# Patient Record
Sex: Male | Born: 1960 | Race: White | Hispanic: No | Smoking: Former smoker
Health system: Southern US, Community
[De-identification: ages and names within clinical notes are randomized; demographics above are authoritative.]

## PROBLEM LIST (undated history)

## (undated) DIAGNOSIS — F6381 Intermittent explosive disorder: Secondary | ICD-10-CM

## (undated) DIAGNOSIS — F200 Paranoid schizophrenia: Secondary | ICD-10-CM

## (undated) DIAGNOSIS — F29 Unspecified psychosis not due to a substance or known physiological condition: Secondary | ICD-10-CM

## (undated) DIAGNOSIS — R569 Unspecified convulsions: Secondary | ICD-10-CM

## (undated) DIAGNOSIS — I639 Cerebral infarction, unspecified: Secondary | ICD-10-CM

## (undated) DIAGNOSIS — F79 Unspecified intellectual disabilities: Secondary | ICD-10-CM

---

## 2015-07-18 ENCOUNTER — Inpatient Hospital Stay (HOSPITAL_COMMUNITY)
Admission: EM | Admit: 2015-07-18 | Discharge: 2015-08-07 | DRG: 092 | Disposition: A | Discharge status: Skilled Nursing Facility | Payer: Medicare Other | Attending: Family Medicine | Admitting: Family Medicine

## 2015-07-18 ENCOUNTER — Emergency Department (HOSPITAL_COMMUNITY): Payer: Medicare Other

## 2015-07-18 ENCOUNTER — Observation Stay (HOSPITAL_COMMUNITY): Payer: Medicare Other

## 2015-07-18 DIAGNOSIS — R1310 Dysphagia, unspecified: Secondary | ICD-10-CM

## 2015-07-18 DIAGNOSIS — J189 Pneumonia, unspecified organism: Secondary | ICD-10-CM

## 2015-07-18 DIAGNOSIS — J449 Chronic obstructive pulmonary disease, unspecified: Secondary | ICD-10-CM | POA: Diagnosis present

## 2015-07-18 DIAGNOSIS — N390 Urinary tract infection, site not specified: Secondary | ICD-10-CM | POA: Diagnosis present

## 2015-07-18 DIAGNOSIS — Z8673 Personal history of transient ischemic attack (TIA), and cerebral infarction without residual deficits: Secondary | ICD-10-CM

## 2015-07-18 DIAGNOSIS — E860 Dehydration: Secondary | ICD-10-CM | POA: Insufficient documentation

## 2015-07-18 DIAGNOSIS — G9340 Encephalopathy, unspecified: Secondary | ICD-10-CM | POA: Diagnosis not present

## 2015-07-18 DIAGNOSIS — F6381 Intermittent explosive disorder: Secondary | ICD-10-CM | POA: Diagnosis present

## 2015-07-18 DIAGNOSIS — Z7982 Long term (current) use of aspirin: Secondary | ICD-10-CM

## 2015-07-18 DIAGNOSIS — R1312 Dysphagia, oropharyngeal phase: Secondary | ICD-10-CM | POA: Diagnosis present

## 2015-07-18 DIAGNOSIS — G92 Toxic encephalopathy: Secondary | ICD-10-CM | POA: Diagnosis not present

## 2015-07-18 DIAGNOSIS — E039 Hypothyroidism, unspecified: Secondary | ICD-10-CM | POA: Diagnosis present

## 2015-07-18 DIAGNOSIS — Z9181 History of falling: Secondary | ICD-10-CM

## 2015-07-18 DIAGNOSIS — F0390 Unspecified dementia without behavioral disturbance: Secondary | ICD-10-CM | POA: Diagnosis present

## 2015-07-18 DIAGNOSIS — E876 Hypokalemia: Secondary | ICD-10-CM | POA: Diagnosis not present

## 2015-07-18 DIAGNOSIS — Z91010 Allergy to peanuts: Secondary | ICD-10-CM

## 2015-07-18 DIAGNOSIS — F79 Unspecified intellectual disabilities: Secondary | ICD-10-CM

## 2015-07-18 DIAGNOSIS — F200 Paranoid schizophrenia: Secondary | ICD-10-CM | POA: Diagnosis present

## 2015-07-18 DIAGNOSIS — R740 Nonspecific elevation of levels of transaminase and lactic acid dehydrogenase [LDH]: Secondary | ICD-10-CM | POA: Diagnosis present

## 2015-07-18 DIAGNOSIS — R4182 Altered mental status, unspecified: Secondary | ICD-10-CM | POA: Insufficient documentation

## 2015-07-18 DIAGNOSIS — J690 Pneumonitis due to inhalation of food and vomit: Secondary | ICD-10-CM | POA: Diagnosis present

## 2015-07-18 DIAGNOSIS — Z515 Encounter for palliative care: Secondary | ICD-10-CM

## 2015-07-18 DIAGNOSIS — R4189 Other symptoms and signs involving cognitive functions and awareness: Secondary | ICD-10-CM | POA: Diagnosis present

## 2015-07-18 DIAGNOSIS — I10 Essential (primary) hypertension: Secondary | ICD-10-CM

## 2015-07-18 DIAGNOSIS — D72829 Elevated white blood cell count, unspecified: Secondary | ICD-10-CM

## 2015-07-18 DIAGNOSIS — E785 Hyperlipidemia, unspecified: Secondary | ICD-10-CM | POA: Diagnosis present

## 2015-07-18 DIAGNOSIS — R4781 Slurred speech: Secondary | ICD-10-CM | POA: Diagnosis present

## 2015-07-18 DIAGNOSIS — F71 Moderate intellectual disabilities: Secondary | ICD-10-CM | POA: Diagnosis present

## 2015-07-18 DIAGNOSIS — D696 Thrombocytopenia, unspecified: Secondary | ICD-10-CM | POA: Diagnosis present

## 2015-07-18 DIAGNOSIS — K219 Gastro-esophageal reflux disease without esophagitis: Secondary | ICD-10-CM | POA: Diagnosis present

## 2015-07-18 DIAGNOSIS — Z781 Physical restraint status: Secondary | ICD-10-CM

## 2015-07-18 DIAGNOSIS — Z66 Do not resuscitate: Secondary | ICD-10-CM | POA: Diagnosis present

## 2015-07-18 DIAGNOSIS — F919 Conduct disorder, unspecified: Secondary | ICD-10-CM | POA: Diagnosis present

## 2015-07-18 LAB — CBC WITH DIFFERENTIAL/PLATELET
Basophils Absolute: 0 10*3/uL (ref 0.0–0.1)
Basophils Relative: 0 %
EOS ABS: 1.1 10*3/uL — AB (ref 0.0–0.7)
EOS PCT: 10 %
HCT: 38.4 % — ABNORMAL LOW (ref 39.0–52.0)
Hemoglobin: 12.6 g/dL — ABNORMAL LOW (ref 13.0–17.0)
LYMPHS ABS: 1.9 10*3/uL (ref 0.7–4.0)
LYMPHS PCT: 17 %
MCH: 29.4 pg (ref 26.0–34.0)
MCHC: 32.8 g/dL (ref 30.0–36.0)
MCV: 89.7 fL (ref 78.0–100.0)
MONO ABS: 1.7 10*3/uL — AB (ref 0.1–1.0)
Monocytes Relative: 15 %
Neutro Abs: 6.7 10*3/uL (ref 1.7–7.7)
Neutrophils Relative %: 59 %
PLATELETS: 96 10*3/uL — AB (ref 150–400)
RBC: 4.28 MIL/uL (ref 4.22–5.81)
RDW: 15.5 % (ref 11.5–15.5)
WBC: 11.4 10*3/uL — AB (ref 4.0–10.5)

## 2015-07-18 LAB — COMPREHENSIVE METABOLIC PANEL
ALT: 72 U/L — ABNORMAL HIGH (ref 17–63)
ANION GAP: 11 (ref 5–15)
AST: 116 U/L — ABNORMAL HIGH (ref 15–41)
Albumin: 3.1 g/dL — ABNORMAL LOW (ref 3.5–5.0)
Alkaline Phosphatase: 53 U/L (ref 38–126)
BUN: 13 mg/dL (ref 6–20)
CHLORIDE: 107 mmol/L (ref 101–111)
CO2: 24 mmol/L (ref 22–32)
CREATININE: 0.82 mg/dL (ref 0.61–1.24)
Calcium: 9 mg/dL (ref 8.9–10.3)
Glucose, Bld: 95 mg/dL (ref 65–99)
POTASSIUM: 3.9 mmol/L (ref 3.5–5.1)
SODIUM: 142 mmol/L (ref 135–145)
Total Bilirubin: 0.8 mg/dL (ref 0.3–1.2)
Total Protein: 6.7 g/dL (ref 6.5–8.1)

## 2015-07-18 LAB — URINALYSIS, ROUTINE W REFLEX MICROSCOPIC
Glucose, UA: NEGATIVE mg/dL
Hgb urine dipstick: NEGATIVE
Ketones, ur: 15 mg/dL — AB
NITRITE: POSITIVE — AB
PH: 6 (ref 5.0–8.0)
Protein, ur: 30 mg/dL — AB
SPECIFIC GRAVITY, URINE: 1.036 — AB (ref 1.005–1.030)

## 2015-07-18 LAB — CK: Total CK: 1514 U/L — ABNORMAL HIGH (ref 49–397)

## 2015-07-18 LAB — URINE MICROSCOPIC-ADD ON

## 2015-07-18 LAB — RAPID URINE DRUG SCREEN, HOSP PERFORMED
AMPHETAMINES: NOT DETECTED
Barbiturates: NOT DETECTED
Benzodiazepines: POSITIVE — AB
Cocaine: NOT DETECTED
OPIATES: NOT DETECTED
TETRAHYDROCANNABINOL: NOT DETECTED

## 2015-07-18 LAB — I-STAT TROPONIN, ED: TROPONIN I, POC: 0.03 ng/mL (ref 0.00–0.08)

## 2015-07-18 LAB — I-STAT CG4 LACTIC ACID, ED: LACTIC ACID, VENOUS: 1.81 mmol/L (ref 0.5–2.0)

## 2015-07-18 LAB — TSH: TSH: 0.701 u[IU]/mL (ref 0.350–4.500)

## 2015-07-18 LAB — VALPROIC ACID LEVEL: VALPROIC ACID LVL: 100 ug/mL (ref 50.0–100.0)

## 2015-07-18 LAB — CBG MONITORING, ED: GLUCOSE-CAPILLARY: 92 mg/dL (ref 65–99)

## 2015-07-18 LAB — ETHANOL: Alcohol, Ethyl (B): <5 mg/dL (ref <5)

## 2015-07-18 LAB — AMMONIA: Ammonia: 24 umol/L (ref 9–35)

## 2015-07-18 MED ORDER — BISACODYL 10 MG RE SUPP: 10.0000 mg | Freq: Every day | RECTAL | Status: DC | PRN

## 2015-07-18 MED ORDER — ACETAMINOPHEN 650 MG RE SUPP: 650.0000 mg | Freq: Four times a day (QID) | RECTAL | Status: DC | PRN

## 2015-07-18 MED ORDER — ALBUTEROL SULFATE (2.5 MG/3ML) 0.083% IN NEBU: 3.0000 mL | INHALATION_SOLUTION | Freq: Four times a day (QID) | RESPIRATORY_TRACT | Status: DC | PRN

## 2015-07-18 MED ORDER — SODIUM CHLORIDE 0.9 % IV BOLUS (SEPSIS)
1000.0000 mL | Freq: Once | INTRAVENOUS | Status: AC
  Administered 2015-07-18: 1000 mL via INTRAVENOUS

## 2015-07-18 MED ORDER — SCOPOLAMINE 1 MG/3DAYS TD PT72
1.0000 | MEDICATED_PATCH | TRANSDERMAL | Status: DC
  Administered 2015-07-18 – 2015-08-05 (×7): 1.5 mg via TRANSDERMAL
  Filled 2015-07-18 (×7): qty 1

## 2015-07-18 MED ORDER — DEXTROSE 5 % IV SOLN
1.0000 g | Freq: Once | INTRAVENOUS | Status: AC
  Administered 2015-07-18: 1 g via INTRAVENOUS
  Filled 2015-07-18: qty 10

## 2015-07-18 MED ORDER — ONDANSETRON HCL 4 MG PO TABS: 4.0000 mg | ORAL_TABLET | Freq: Four times a day (QID) | ORAL | Status: DC | PRN

## 2015-07-18 MED ORDER — ONDANSETRON HCL 4 MG/2ML IJ SOLN: 4.0000 mg | Freq: Four times a day (QID) | INTRAMUSCULAR | Status: DC | PRN

## 2015-07-18 MED ORDER — ACETAMINOPHEN 325 MG PO TABS
650.0000 mg | ORAL_TABLET | Freq: Four times a day (QID) | ORAL | Status: DC | PRN
Start: 2015-07-18 — End: 2015-08-07
  Administered 2015-07-19 – 2015-08-01 (×3): 650 mg via ORAL
  Filled 2015-07-18 (×3): qty 2

## 2015-07-18 MED ORDER — DIVALPROEX SODIUM ER 500 MG PO TB24
1750.0000 mg | ORAL_TABLET | Freq: Every day | ORAL | Status: DC
  Administered 2015-07-18 – 2015-07-19 (×2): 1750 mg via ORAL
  Filled 2015-07-18 (×3): qty 1

## 2015-07-18 MED ORDER — SENNOSIDES-DOCUSATE SODIUM 8.6-50 MG PO TABS
1.0000 | ORAL_TABLET | Freq: Every evening | ORAL | Status: DC | PRN
  Filled 2015-07-18: qty 1

## 2015-07-18 MED ORDER — CLONAZEPAM 0.5 MG PO TABS
0.5000 mg | ORAL_TABLET | Freq: Two times a day (BID) | ORAL | Status: DC
  Administered 2015-07-18: 0.5 mg via ORAL
  Filled 2015-07-18 (×2): qty 1

## 2015-07-18 MED ORDER — ENOXAPARIN SODIUM 40 MG/0.4ML ~~LOC~~ SOLN
40.0000 mg | SUBCUTANEOUS | Status: DC
  Administered 2015-07-18 – 2015-08-06 (×18): 40 mg via SUBCUTANEOUS
  Filled 2015-07-18 (×20): qty 0.4

## 2015-07-18 MED ORDER — HYDROCODONE-ACETAMINOPHEN 5-325 MG PO TABS
1.0000 | ORAL_TABLET | ORAL | Status: DC | PRN
  Administered 2015-07-18: 2 via ORAL
  Filled 2015-07-18: qty 2

## 2015-07-18 MED ORDER — OXCARBAZEPINE 150 MG PO TABS
150.0000 mg | ORAL_TABLET | Freq: Two times a day (BID) | ORAL | Status: DC
  Administered 2015-07-18 – 2015-07-28 (×19): 150 mg via ORAL
  Filled 2015-07-18 (×22): qty 1

## 2015-07-18 MED ORDER — PANTOPRAZOLE SODIUM 40 MG PO TBEC
40.0000 mg | DELAYED_RELEASE_TABLET | Freq: Every day | ORAL | Status: DC
  Administered 2015-07-18 – 2015-08-07 (×21): 40 mg via ORAL
  Filled 2015-07-18 (×21): qty 1

## 2015-07-18 MED ORDER — SODIUM CHLORIDE 0.9 % IV SOLN
INTRAVENOUS | Status: DC
  Administered 2015-07-18: 1 mL via INTRAVENOUS
  Administered 2015-07-19: 08:00:00 via INTRAVENOUS
  Administered 2015-07-19 – 2015-07-20 (×2): 1 mL via INTRAVENOUS
  Administered 2015-07-20 – 2015-07-29 (×13): via INTRAVENOUS

## 2015-07-18 MED ORDER — LORAZEPAM 0.5 MG PO TABS
0.5000 mg | ORAL_TABLET | Freq: Two times a day (BID) | ORAL | Status: DC
  Administered 2015-07-18 – 2015-07-19 (×2): 0.5 mg via ORAL
  Filled 2015-07-18 (×2): qty 1

## 2015-07-18 MED ORDER — TAMSULOSIN HCL 0.4 MG PO CAPS
0.4000 mg | ORAL_CAPSULE | Freq: Every day | ORAL | Status: DC
  Administered 2015-07-18 – 2015-08-06 (×18): 0.4 mg via ORAL
  Filled 2015-07-18 (×19): qty 1

## 2015-07-18 MED ORDER — DEXTROSE 5 % IV SOLN
1.0000 g | INTRAVENOUS | Status: DC
  Administered 2015-07-19 – 2015-07-21 (×3): 1 g via INTRAVENOUS
  Filled 2015-07-18 (×4): qty 10

## 2015-07-18 MED ORDER — ASPIRIN EC 81 MG PO TBEC
81.0000 mg | DELAYED_RELEASE_TABLET | Freq: Every day | ORAL | Status: DC
  Administered 2015-07-18 – 2015-08-07 (×21): 81 mg via ORAL
  Filled 2015-07-18 (×21): qty 1

## 2015-07-18 MED ORDER — ATORVASTATIN CALCIUM 80 MG PO TABS
80.0000 mg | ORAL_TABLET | Freq: Every day | ORAL | Status: DC
  Administered 2015-07-18 – 2015-08-07 (×21): 80 mg via ORAL
  Filled 2015-07-18 (×21): qty 1

## 2015-07-18 MED ORDER — CHLORPROMAZINE HCL 100 MG PO TABS
100.0000 mg | ORAL_TABLET | Freq: Three times a day (TID) | ORAL | Status: DC
  Administered 2015-07-18 – 2015-07-20 (×4): 100 mg via ORAL
  Filled 2015-07-18 (×7): qty 1

## 2015-07-18 MED ORDER — LEVOTHYROXINE SODIUM 25 MCG PO TABS
25.0000 ug | ORAL_TABLET | Freq: Every day | ORAL | Status: DC
  Administered 2015-07-19 – 2015-08-07 (×20): 25 ug via ORAL
  Filled 2015-07-18 (×20): qty 1

## 2015-07-18 NOTE — ED Notes (Signed)
Pt has been sent to ED from PCP. Family lives in an [facility] home and is a patient with [facility]. Caregiver states the pt was put into their care on 06/30/15 and has since had a steady decline in cognitive function and ability to perform ADL'S since. Pt is now unable to ambulate and has became a total care patient. PCP sent pt over rt decline in function. Pt is lethargic and disoriented x3, pt is oriented to person.

## 2015-07-18 NOTE — ED Notes (Signed)
Pt delivered at front of ER by unknown woman. Pt moaning, unable to verbally communicate and very lethargic. Unknown baseline. Respirations even and equal.

## 2015-07-18 NOTE — H&P (Signed)
[facility] History and Physical  Wynton Hufstetler ZOX:[id number] DOB: 15-Sep-1960 DOA: 07/18/2015  Referring physician: Emergency Department PCP: No primary care provider on file.   CHIEF COMPLAINT:   Behavior changes       HPI: Jeff Foster is a 55 y.o. male with mental retardation brought in today from group home. Recently moved to [facility] for Group home placement. No family here, just caregivers. Patient was able to perform ADLs until a couple of weeks ago. Over last few days he has slept all day. Speech incoherent at times. He has been confused. Caregivers report that liquids "come back up" but he tolerates solids. Patient trying to get established with local PCP. He was brought to ED because he hasn't been unable to get to an appt because of recent decline in level of functioning. Per staff, no complaints of pain. No diarrhea       ED COURSE:     Labs:   Ammonia 24, lactic acid 1.81  AST 116, ALT 72 , normal alkaline phosphatase and normal bilirubin   UDS positive for benzos  Urinalysis:    Many bacteria, positive nitrites, 6-30 WBCs                   EKG:    Sinus rhythm RBBB and LAFB Probable left ventricular hypertrophy                  Medications  sodium chloride 0.9 % bolus 1,000 mL (1,000 mLs Intravenous New Bag/Given 07/18/15 1449)  sodium chloride 0.9 % bolus 1,000 mL (0 mLs Intravenous Stopped 07/18/15 1431)  sodium chloride 0.9 % bolus 1,000 mL (0 mLs Intravenous Stopped 07/18/15 1431)  cefTRIAXone (ROCEPHIN) 1 g in dextrose 5 % 50 mL IVPB (1 g Intravenous New Bag/Given 07/18/15 1430)   Review of Systems  Unable to perform ROS: medical condition    PMH: Unable to obtain from patient. Home medication profile would suggest history of hyperlipidemia, hypothyroidism, GERD.   PSH: unable to obtain.  SOCIAL HISTORY:  has no tobacco, alcohol, and drug history on file. Lives: at home with   Assistive devices:   None needed for ambulation.   Allergies  Allergen  Reactions  . Other Other (See Comments)    clorox bleach  . Peanut-Containing Drug Products    FMH: unobtainable  Prior to Admission medications   Medication Sig Start Date End Date Taking? Authorizing Provider  albuterol (PROVENTIL HFA;VENTOLIN HFA) 108 (90 Base) MCG/ACT inhaler Inhale 2 puffs into the lungs every 6 (six) hours as needed for wheezing or shortness of breath.   Yes Historical Provider, MD  aspirin EC 81 MG tablet Take 81 mg by mouth daily.   Yes Historical Provider, MD  atorvastatin (LIPITOR) 80 MG tablet Take 80 mg by mouth daily.   Yes Historical Provider, MD  chlorproMAZINE (THORAZINE) 100 MG tablet Take 100 mg by mouth 3 (three) times daily.   Yes Historical Provider, MD  clonazePAM (KLONOPIN) 1 MG tablet Take 1 mg by mouth 2 (two) times daily.   Yes Historical Provider, MD  divalproex (DEPAKOTE ER) 250 MG 24 hr tablet Take 1,750 mg by mouth at bedtime.   Yes Historical Provider, MD  esomeprazole (NEXIUM) 40 MG capsule Take 40 mg by mouth daily at 12 noon.   Yes Historical Provider, MD  levothyroxine (SYNTHROID, LEVOTHROID) 25 MCG tablet Take 25 mcg by mouth daily before breakfast.   Yes Historical Provider, MD  LORazepam (ATIVAN) 1 MG tablet Take 1  mg by mouth every 6 (six) hours as needed for anxiety.   Yes Historical Provider, MD  OXcarbazepine (TRILEPTAL) 150 MG tablet Take 150 mg by mouth 2 (two) times daily.   Yes Historical Provider, MD  scopolamine (TRANSDERM-SCOP) 1 MG/3DAYS Place 1 patch onto the skin every 3 (three) days.   Yes Historical Provider, MD  tamsulosin (FLOMAX) 0.4 MG CAPS capsule Take 0.4 mg by mouth at bedtime.   Yes Historical Provider, MD   PHYSICAL EXAM: Filed Vitals:   07/18/15 1400 07/18/15 1430 07/18/15 1500 07/18/15 1508  BP: 137/76 147/89 105/58   Pulse: 85 87 77   Temp:    100 F (37.8 C)  TempSrc:    Rectal  Resp: 14 15 21    SpO2: 100% 100% 96%     Wt Readings from Last 3 Encounters:  No data found for Wt    General:   Pleasant white male. Appears calm and comfortable Eyes: PER, normal lids, irises & conjunctiva ENT: grossly normal hearing, lips & tongue Neck: no LAD, no masses Cardiovascular: RRR, no murmurs. No LE edema.  Respiratory: Respirations even and unlabored. Normal respiratory effort. Lungs CTA bilaterally, no wheezes / rales .   Abdomen: soft, non-distended, non-tender, active bowel sounds. No obvious masses.  Skin: no rash seen on limited exam Musculoskeletal: grossly normal tone BUE/BLE Psychiatric: somewhat combative when touched. Keeps eyes closed Neurologic: will not follow commands.         LABS ON ADMISSION:    Basic Metabolic Panel:  Recent Labs Lab 07/18/15 1235  NA 142  K 3.9  CL 107  CO2 24  GLUCOSE 95  BUN 13  CREATININE 0.82  CALCIUM 9.0   Liver Function Tests:  Recent Labs Lab 07/18/15 1235  AST 116*  ALT 72*  ALKPHOS 53  BILITOT 0.8  PROT 6.7  ALBUMIN 3.1*     Recent Labs Lab 07/18/15 1305  AMMONIA 24   CBC:  Recent Labs Lab 07/18/15 1235  WBC 11.4*  NEUTROABS 6.7  HGB 12.6*  HCT 38.4*  MCV 89.7  PLT 96*    CBG:  Recent Labs Lab 07/18/15 1234  GLUCAP 92    Creatinine clearance cannot be calculated (Unknown ideal weight.)  Radiological Exams on Admission: Ct Head Wo Contrast  07/18/2015  CLINICAL DATA:  Mental status changes. EXAM: CT HEAD WITHOUT CONTRAST TECHNIQUE: Contiguous axial images were obtained from the base of the skull through the vertex without intravenous contrast. COMPARISON:  None. FINDINGS: Age advanced cerebral atrophy, ventriculomegaly and periventricular white matter disease. No extra-axial fluid collections. No CT findings for hemispheric infarction an or intracranial hemorrhage. No mass lesions. The brainstem and cerebellum are grossly normal. No acute bony findings. No skull fracture or bone lesion. Scattered mucoperiosteal thickening involving the ethmoid air cells and maxillary sinuses. No air-fluid levels.  The mastoid air cells and middle ear cavities are clear. The globes are intact. IMPRESSION: Age advanced cerebral atrophy, ventriculomegaly and periventricular white matter disease. No acute intracranial findings or skull fracture. Electronically Signed   By: Rudie MeyerP.  Gallerani M.D.   On: 07/18/2015 13:43   Dg Humerus Left  07/18/2015  CLINICAL DATA:  Deformity, weakness, altered mental status. EXAM: LEFT HUMERUS - 2+ VIEW COMPARISON:  None. FINDINGS: Examination demonstrates chronic bony remodeling over the distal diametaphyseal region of the left humerus compatible with an old fracture site. No acute fracture or dislocation. Mild degenerative change of the [facility] joint. IV tubing over the left antecubital region. IMPRESSION: No acute  findings. Old distal left humeral fracture. Electronically Signed   By: Elberta Fortis M.D.   On: 07/18/2015 14:24     ASSESSMENT / PLAN   Acute encephalopathy, possibly secondary to UTI. Head CTscan negative for acute findings. Normal ammonia.  -admit to Observation - telemetry bed -Rocephin for UTI -awaiting blood and urine cultures -obtain CXR -Patient on Depakote and Trileptal. Not sure if he has history of seizures or just mood disturbances. Will obtain a Depakote level. Continue Tripleptal and Depakote. Decrease dose of home Benzos for now given mental status changes.   Mental retardation. Recently moved to [facility] for group home placement. Over last few days he has been unable to perform ADLs. According to staff he is confused and sleeping a lot. Baseline not well known but patient was ambulating a couple of weeks ago. Now can't perform any ADLs.   - Physical Therapy evaluation    Sinus tachycardia, resolved. Dry mucous membranes, suspect dehyration. He is s/p 2 liters bolus in ED -Continue IVF  Dysphagia. Per Caregiver patient has difficulty swallowing fluids (comes back up). Tolerates solids. -SLP evaluation -regular diet for now. No thin liquids until  speech swallowing  Hypertension. Controlled  Transaminitis. No baseline LFTs to compare. Possibly secondary to medications.  -check am LFTs.  -consider abdominal u/s if LFTs don't improve.   Hypothyroidism.  -continue home synthroid. -check TSH  GERD. -continue daily PPI  History of CVA per records brought in by Group Home.   Upon discharge patient will need 30 day supply of home medications until he can get established with PCP   CONSULTANTS:   none  Code Status: Full code DVT Prophylaxis: Lovenox Family Communication:  Patient lethargic, confused.    Disposition Plan: Discharge back to group home in 24-48 hours   Time spent: 60 minutes Willette Cluster  NP [facility] Pager [phone]

## 2015-07-18 NOTE — ED Notes (Signed)
Patient transported to CT 

## 2015-07-18 NOTE — ED Provider Notes (Signed)
CSN: [id number]     Arrival date & time 07/18/15  1229 History   First MD Initiated Contact with Patient 07/18/15 1235     Chief Complaint  Patient presents with  . Altered Mental Status     (Consider location/radiation/quality/duration/timing/severity/associated sxs/prior Treatment) HPI  Level V caveat applies. Patient with history of MR and group home presents for increasing confusion and decreasing functionality over the last few weeks. Per group home members patient was initially able to perform some of his activities of daily living. He is now fully dependent at this point.  No past medical history on file. No past surgical history on file. No family history on file. Social History  Substance Use Topics  . Smoking status: Not on file  . Smokeless tobacco: Not on file  . Alcohol Use: Not on file    Review of Systems  Unable to perform ROS: Mental status change      Allergies  Other and Peanut-containing drug products  Home Medications   Prior to Admission medications   Medication Sig Start Date End Date Taking? Authorizing Provider  albuterol (PROVENTIL HFA;VENTOLIN HFA) 108 (90 Base) MCG/ACT inhaler Inhale 2 puffs into the lungs every 6 (six) hours as needed for wheezing or shortness of breath.   Yes Historical Provider, MD  aspirin EC 81 MG tablet Take 81 mg by mouth daily.   Yes Historical Provider, MD  atorvastatin (LIPITOR) 80 MG tablet Take 80 mg by mouth daily.   Yes Historical Provider, MD  chlorproMAZINE (THORAZINE) 100 MG tablet Take 100 mg by mouth 3 (three) times daily.   Yes Historical Provider, MD  clonazePAM (KLONOPIN) 1 MG tablet Take 1 mg by mouth 2 (two) times daily.   Yes Historical Provider, MD  divalproex (DEPAKOTE ER) 250 MG 24 hr tablet Take 1,750 mg by mouth at bedtime.   Yes Historical Provider, MD  esomeprazole (NEXIUM) 40 MG capsule Take 40 mg by mouth daily at 12 noon.   Yes Historical Provider, MD  levothyroxine (SYNTHROID, LEVOTHROID) 25  MCG tablet Take 25 mcg by mouth daily before breakfast.   Yes Historical Provider, MD  LORazepam (ATIVAN) 1 MG tablet Take 1 mg by mouth every 6 (six) hours as needed for anxiety.   Yes Historical Provider, MD  OXcarbazepine (TRILEPTAL) 150 MG tablet Take 150 mg by mouth 2 (two) times daily.   Yes Historical Provider, MD  scopolamine (TRANSDERM-SCOP) 1 MG/3DAYS Place 1 patch onto the skin every 3 (three) days.   Yes Historical Provider, MD  tamsulosin (FLOMAX) 0.4 MG CAPS capsule Take 0.4 mg by mouth at bedtime.   Yes Historical Provider, MD   BP 105/58 mmHg  Pulse 77  Temp(Src) 100 F (37.8 C) (Rectal)  Resp 21  SpO2 96% Physical Exam  Constitutional: He appears well-developed and well-nourished. No distress.  Patient is wearing a diaper with dark urine in the diaper.  HENT:  Head: Normocephalic and atraumatic.  Dry mucous membranes  Eyes: EOM are normal. Pupils are equal, round, and reactive to light.  Neck: Normal range of motion. Neck supple.  No meningismus  Cardiovascular: Normal rate and regular rhythm.  Exam reveals no gallop and no friction rub.   No murmur heard. Pulmonary/Chest: Effort normal and breath sounds normal. No respiratory distress. He has no wheezes. He has no rales. He exhibits no tenderness.  Abdominal: Soft. Bowel sounds are normal. He exhibits no distension and no mass. There is no tenderness. There is no rebound and no guarding.  Genitourinary: Penis normal.  Musculoskeletal: Normal range of motion. He exhibits no edema or tenderness.  Patient with left humeral deformity and overlying contusion. Distal pulses intact and equal. No lower extremity swelling or pain.  Neurological:  Patient with incomprehensible speech. Appears to be moving all extremities equally. Follows very simple commands  Skin: Skin is warm and dry. No rash noted. No erythema.  Healing abrasion to left forehead. Several abrasions noted to bilateral lower extremities.  Nursing note and  vitals reviewed.   ED Course  Procedures (including critical care time) Labs Review Labs Reviewed  CBC WITH DIFFERENTIAL/PLATELET - Abnormal; Notable for the following:    WBC 11.4 (*)    Hemoglobin 12.6 (*)    HCT 38.4 (*)    Platelets 96 (*)    Monocytes Absolute 1.7 (*)    Eosinophils Absolute 1.1 (*)    All other components within normal limits  COMPREHENSIVE METABOLIC PANEL - Abnormal; Notable for the following:    Albumin 3.1 (*)    AST 116 (*)    ALT 72 (*)    All other components within normal limits  URINALYSIS, ROUTINE W REFLEX MICROSCOPIC (NOT AT [facility]) - Abnormal; Notable for the following:    Color, Urine AMBER (*)    Specific Gravity, Urine 1.036 (*)    Bilirubin Urine MODERATE (*)    Ketones, ur 15 (*)    Protein, ur 30 (*)    Nitrite POSITIVE (*)    Leukocytes, UA SMALL (*)    All other components within normal limits  URINE RAPID DRUG SCREEN, HOSP PERFORMED - Abnormal; Notable for the following:    Benzodiazepines POSITIVE (*)    All other components within normal limits  URINE MICROSCOPIC-ADD ON - Abnormal; Notable for the following:    Squamous Epithelial / LPF 0-5 (*)    Bacteria, UA MANY (*)    All other components within normal limits  CULTURE, BLOOD (ROUTINE X 2)  CULTURE, BLOOD (ROUTINE X 2)  AMMONIA  ETHANOL  CK  I-STAT CG4 LACTIC ACID, ED  I-STAT TROPOININ, ED  CBG MONITORING, ED    Imaging Review Ct Head Wo Contrast  07/18/2015  CLINICAL DATA:  Mental status changes. EXAM: CT HEAD WITHOUT CONTRAST TECHNIQUE: Contiguous axial images were obtained from the base of the skull through the vertex without intravenous contrast. COMPARISON:  None. FINDINGS: Age advanced cerebral atrophy, ventriculomegaly and periventricular white matter disease. No extra-axial fluid collections. No CT findings for hemispheric infarction an or intracranial hemorrhage. No mass lesions. The brainstem and cerebellum are grossly normal. No acute bony findings. No skull  fracture or bone lesion. Scattered mucoperiosteal thickening involving the ethmoid air cells and maxillary sinuses. No air-fluid levels. The mastoid air cells and middle ear cavities are clear. The globes are intact. IMPRESSION: Age advanced cerebral atrophy, ventriculomegaly and periventricular white matter disease. No acute intracranial findings or skull fracture. Electronically Signed   By: Rudie MeyerP.  Gallerani M.D.   On: 07/18/2015 13:43   Dg Humerus Left  07/18/2015  CLINICAL DATA:  Deformity, weakness, altered mental status. EXAM: LEFT HUMERUS - 2+ VIEW COMPARISON:  None. FINDINGS: Examination demonstrates chronic bony remodeling over the distal diametaphyseal region of the left humerus compatible with an old fracture site. No acute fracture or dislocation. Mild degenerative change of the [facility] joint. IV tubing over the left antecubital region. IMPRESSION: No acute findings. Old distal left humeral fracture. Electronically Signed   By: Elberta Fortisaniel  Boyle M.D.   On: 07/18/2015 14:24  I have personally reviewed and evaluated these images and lab results as part of my medical decision-making.   EKG Interpretation   Date/Time:  Friday July 18 2015 13:02:35 EST Ventricular Rate:  86 PR Interval:  143 QRS Duration: 141 QT Interval:  393 QTC Calculation: 470 R Axis:   -62 Text Interpretation:  Sinus rhythm RBBB and LAFB Probable left ventricular  hypertrophy Confirmed by Ranae Palms  MD, Emmamae Mcnamara ([id number]) on 07/18/2015 3:34:54  PM      MDM   Final diagnoses:  Altered mental status, unspecified altered mental status type  UTI (lower urinary tract infection)  Dehydration    Discussed with group home staff. Patient with decreased ability to care for himself and increasing lethargy. Evidence of UTI on UA. Mild elevation of white blood cell count with low-grade fever. CT head without any acute abnormality. Given IV fluids and antibiotics in the emergency department. We'll admit to MedSurg bed. Discussed with  [facility].    Loren Racer, MD 07/18/15 1534

## 2015-07-18 NOTE — ED Notes (Signed)
Patient transported to X-ray 

## 2015-07-19 DIAGNOSIS — J690 Pneumonitis due to inhalation of food and vomit: Secondary | ICD-10-CM | POA: Diagnosis not present

## 2015-07-19 DIAGNOSIS — G9340 Encephalopathy, unspecified: Secondary | ICD-10-CM | POA: Diagnosis not present

## 2015-07-19 DIAGNOSIS — D696 Thrombocytopenia, unspecified: Secondary | ICD-10-CM | POA: Diagnosis present

## 2015-07-19 DIAGNOSIS — I10 Essential (primary) hypertension: Secondary | ICD-10-CM | POA: Diagnosis present

## 2015-07-19 DIAGNOSIS — E876 Hypokalemia: Secondary | ICD-10-CM | POA: Diagnosis not present

## 2015-07-19 DIAGNOSIS — Z781 Physical restraint status: Secondary | ICD-10-CM | POA: Diagnosis not present

## 2015-07-19 DIAGNOSIS — Z7982 Long term (current) use of aspirin: Secondary | ICD-10-CM | POA: Diagnosis not present

## 2015-07-19 DIAGNOSIS — F71 Moderate intellectual disabilities: Secondary | ICD-10-CM | POA: Diagnosis present

## 2015-07-19 DIAGNOSIS — F79 Unspecified intellectual disabilities: Secondary | ICD-10-CM | POA: Diagnosis not present

## 2015-07-19 DIAGNOSIS — R1312 Dysphagia, oropharyngeal phase: Secondary | ICD-10-CM | POA: Diagnosis present

## 2015-07-19 DIAGNOSIS — Z66 Do not resuscitate: Secondary | ICD-10-CM | POA: Diagnosis not present

## 2015-07-19 DIAGNOSIS — Z8673 Personal history of transient ischemic attack (TIA), and cerebral infarction without residual deficits: Secondary | ICD-10-CM | POA: Diagnosis not present

## 2015-07-19 DIAGNOSIS — R1310 Dysphagia, unspecified: Secondary | ICD-10-CM | POA: Diagnosis not present

## 2015-07-19 DIAGNOSIS — R4781 Slurred speech: Secondary | ICD-10-CM | POA: Diagnosis present

## 2015-07-19 DIAGNOSIS — F919 Conduct disorder, unspecified: Secondary | ICD-10-CM | POA: Diagnosis present

## 2015-07-19 DIAGNOSIS — R4182 Altered mental status, unspecified: Secondary | ICD-10-CM | POA: Diagnosis present

## 2015-07-19 DIAGNOSIS — E785 Hyperlipidemia, unspecified: Secondary | ICD-10-CM | POA: Diagnosis present

## 2015-07-19 DIAGNOSIS — K219 Gastro-esophageal reflux disease without esophagitis: Secondary | ICD-10-CM | POA: Diagnosis present

## 2015-07-19 DIAGNOSIS — E860 Dehydration: Secondary | ICD-10-CM | POA: Diagnosis present

## 2015-07-19 DIAGNOSIS — F0390 Unspecified dementia without behavioral disturbance: Secondary | ICD-10-CM | POA: Diagnosis present

## 2015-07-19 DIAGNOSIS — E039 Hypothyroidism, unspecified: Secondary | ICD-10-CM | POA: Diagnosis present

## 2015-07-19 DIAGNOSIS — F209 Schizophrenia, unspecified: Secondary | ICD-10-CM | POA: Diagnosis not present

## 2015-07-19 DIAGNOSIS — Z91010 Allergy to peanuts: Secondary | ICD-10-CM | POA: Diagnosis not present

## 2015-07-19 DIAGNOSIS — R740 Nonspecific elevation of levels of transaminase and lactic acid dehydrogenase [LDH]: Secondary | ICD-10-CM | POA: Diagnosis present

## 2015-07-19 DIAGNOSIS — F200 Paranoid schizophrenia: Secondary | ICD-10-CM | POA: Diagnosis present

## 2015-07-19 DIAGNOSIS — R4189 Other symptoms and signs involving cognitive functions and awareness: Secondary | ICD-10-CM | POA: Diagnosis not present

## 2015-07-19 DIAGNOSIS — F6381 Intermittent explosive disorder: Secondary | ICD-10-CM | POA: Diagnosis present

## 2015-07-19 DIAGNOSIS — J449 Chronic obstructive pulmonary disease, unspecified: Secondary | ICD-10-CM | POA: Diagnosis present

## 2015-07-19 DIAGNOSIS — Z9181 History of falling: Secondary | ICD-10-CM | POA: Diagnosis not present

## 2015-07-19 DIAGNOSIS — Z515 Encounter for palliative care: Secondary | ICD-10-CM | POA: Diagnosis not present

## 2015-07-19 DIAGNOSIS — N390 Urinary tract infection, site not specified: Secondary | ICD-10-CM | POA: Diagnosis present

## 2015-07-19 DIAGNOSIS — G92 Toxic encephalopathy: Secondary | ICD-10-CM | POA: Diagnosis present

## 2015-07-19 LAB — CBC
HCT: 32.1 % — ABNORMAL LOW (ref 39.0–52.0)
Hemoglobin: 10.7 g/dL — ABNORMAL LOW (ref 13.0–17.0)
MCH: 30.3 pg (ref 26.0–34.0)
MCHC: 33.3 g/dL (ref 30.0–36.0)
MCV: 90.9 fL (ref 78.0–100.0)
PLATELETS: 75 10*3/uL — AB (ref 150–400)
RBC: 3.53 MIL/uL — ABNORMAL LOW (ref 4.22–5.81)
RDW: 15.5 % (ref 11.5–15.5)
WBC: 8.6 10*3/uL (ref 4.0–10.5)

## 2015-07-19 LAB — BASIC METABOLIC PANEL
Anion gap: 9 (ref 5–15)
BUN: 10 mg/dL (ref 6–20)
CHLORIDE: 111 mmol/L (ref 101–111)
CO2: 22 mmol/L (ref 22–32)
CREATININE: 0.64 mg/dL (ref 0.61–1.24)
Calcium: 8.3 mg/dL — ABNORMAL LOW (ref 8.9–10.3)
GFR calc Af Amer: >60 mL/min (ref >60)
GFR calc non Af Amer: >60 mL/min (ref >60)
GLUCOSE: 89 mg/dL (ref 65–99)
Potassium: 3.8 mmol/L (ref 3.5–5.1)
Sodium: 142 mmol/L (ref 135–145)

## 2015-07-19 MED ORDER — CLONAZEPAM 0.5 MG PO TABS
0.5000 mg | ORAL_TABLET | Freq: Two times a day (BID) | ORAL | Status: DC | PRN
  Administered 2015-07-19: 0.5 mg via ORAL
  Filled 2015-07-19: qty 1

## 2015-07-19 MED ORDER — STARCH (THICKENING) PO POWD
ORAL | Status: DC | PRN
  Filled 2015-07-19 (×4): qty 227

## 2015-07-19 NOTE — Progress Notes (Signed)
PT Cancellation Note  Patient Details Name: Genene ChurnJohn Marchetta MRN: [id number] DOB: 1961-02-27   Cancelled Treatment:    Reason Eval/Treat Not Completed: Fatigue/lethargy limiting ability to participate Pt obtunded. Will need to be more alert to participate with physical therapy. Will attempt tomorrow.  Berton MountBarbour, Kyron Schlitt S 07/19/2015, 4:20 PM Charlsie MerlesLogan Secor Jaxsin Bottomley, [place] [phone]

## 2015-07-19 NOTE — Progress Notes (Signed)
Foley inserted for acute urinary retention. NT immediately emptied 1100 ml  upon insertion of the catheter.
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
## 2015-07-20 DIAGNOSIS — R4189 Other symptoms and signs involving cognitive functions and awareness: Secondary | ICD-10-CM

## 2015-07-20 LAB — COMPREHENSIVE METABOLIC PANEL
ALBUMIN: 2.2 g/dL — AB (ref 3.5–5.0)
ALK PHOS: 46 U/L (ref 38–126)
ALT: 46 U/L (ref 17–63)
ANION GAP: 10 (ref 5–15)
AST: 68 U/L — AB (ref 15–41)
BILIRUBIN TOTAL: 0.6 mg/dL (ref 0.3–1.2)
BUN: 8 mg/dL (ref 6–20)
CALCIUM: 8.4 mg/dL — AB (ref 8.9–10.3)
CO2: 24 mmol/L (ref 22–32)
CREATININE: 0.68 mg/dL (ref 0.61–1.24)
Chloride: 108 mmol/L (ref 101–111)
GFR calc non Af Amer: >60 mL/min (ref >60)
Glucose, Bld: 92 mg/dL (ref 65–99)
Potassium: 3.9 mmol/L (ref 3.5–5.1)
Sodium: 142 mmol/L (ref 135–145)
TOTAL PROTEIN: 5.4 g/dL — AB (ref 6.5–8.1)

## 2015-07-20 LAB — CBC
HEMATOCRIT: 33.4 % — AB (ref 39.0–52.0)
HEMOGLOBIN: 11.2 g/dL — AB (ref 13.0–17.0)
MCH: 30.2 pg (ref 26.0–34.0)
MCHC: 33.5 g/dL (ref 30.0–36.0)
MCV: 90 fL (ref 78.0–100.0)
Platelets: 70 10*3/uL — ABNORMAL LOW (ref 150–400)
RBC: 3.71 MIL/uL — ABNORMAL LOW (ref 4.22–5.81)
RDW: 15.3 % (ref 11.5–15.5)
WBC: 9.5 10*3/uL (ref 4.0–10.5)

## 2015-07-20 LAB — URINE CULTURE

## 2015-07-20 MED ORDER — CHLORPROMAZINE HCL 50 MG PO TABS
50.0000 mg | ORAL_TABLET | Freq: Three times a day (TID) | ORAL | Status: DC
  Administered 2015-07-21 – 2015-07-22 (×4): 50 mg via ORAL
  Filled 2015-07-20 (×8): qty 1

## 2015-07-20 MED ORDER — DIVALPROEX SODIUM ER 500 MG PO TB24
1000.0000 mg | ORAL_TABLET | Freq: Every day | ORAL | Status: DC
  Administered 2015-07-21: 1000 mg via ORAL
  Filled 2015-07-20 (×2): qty 2

## 2015-07-20 MED ORDER — FAMOTIDINE 10 MG PO TABS
10.0000 mg | ORAL_TABLET | Freq: Once | ORAL | Status: DC
  Filled 2015-07-20 (×3): qty 1

## 2015-07-20 MED ORDER — LORAZEPAM 2 MG/ML IJ SOLN
0.5000 mg | Freq: Once | INTRAMUSCULAR | Status: AC
  Administered 2015-07-20: 0.5 mg via INTRAVENOUS
  Filled 2015-07-20: qty 1

## 2015-07-20 NOTE — Progress Notes (Signed)
PT Cancellation Note  Patient Details Name: Jeff Foster MRN: [id number] DOB: August 08, 1960   Cancelled Treatment:    Reason Eval/Treat Not Completed: Fatigue/lethargy limiting ability to participate (Dr. Jomarie LongsJoseph is aware, and requested we return tomorrow for PT eval)  Van ClinesHolly Khalil Belote, PT  Acute Rehabilitation Services Pager [phone] Office [phone]    Van ClinesGarrigan, [name] [facility] 07/20/2015, 12:14 PM
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
## 2015-07-21 ENCOUNTER — Inpatient Hospital Stay (HOSPITAL_COMMUNITY): Payer: Medicare Other

## 2015-07-21 MED ORDER — CHLORHEXIDINE GLUCONATE 0.12 % MT SOLN
15.0000 mL | Freq: Two times a day (BID) | OROMUCOSAL | Status: DC
  Administered 2015-07-21 – 2015-08-05 (×26): 15 mL via OROMUCOSAL
  Filled 2015-07-21 (×24): qty 15

## 2015-07-21 MED ORDER — LORAZEPAM 2 MG/ML IJ SOLN
1.0000 mg | Freq: Once | INTRAMUSCULAR | Status: AC
  Administered 2015-07-21: 1 mg via INTRAVENOUS
  Filled 2015-07-21: qty 1

## 2015-07-21 MED ORDER — CETYLPYRIDINIUM CHLORIDE 0.05 % MT LIQD
7.0000 mL | Freq: Two times a day (BID) | OROMUCOSAL | Status: DC
  Administered 2015-07-21 – 2015-07-31 (×11): 7 mL via OROMUCOSAL

## 2015-07-21 MED ORDER — HALOPERIDOL LACTATE 5 MG/ML IJ SOLN
2.0000 mg | Freq: Four times a day (QID) | INTRAMUSCULAR | Status: DC | PRN
  Administered 2015-07-21 – 2015-07-24 (×10): 2 mg via INTRAVENOUS
  Filled 2015-07-21 (×10): qty 1

## 2015-07-21 NOTE — Procedures (Signed)
History: Acute encephalopathy  Sedation: None  Technique: This is a 21 channel routine scalp EEG performed at the bedside with bipolar and monopolar montages arranged in accordance to the international 10/20 system of electrode placement. One channel was dedicated to EKG recording.    Background: There is a posterior dominant rhythm of 8 Hz. In addition, there is generalized irregular delta and theta activity throughout the recording. This appears to increased drowsiness.  Photic stimulation: Physiologic driving is not performed  EEG Abnormalities: 1) generalized slow activity  Clinical Interpretation: This EEG is consistent with a mild generalized non-specific cerebral dysfunction(encephalopathy). There was no seizure or seizure predisposition recorded on this study.   Jeff SlotMcNeill Lavaun Greenfield, MD [facility] [phone]  If 7pm- 7am, please page neurology on call as listed in AMION.

## 2015-07-21 NOTE — Progress Notes (Signed)
EEG Completed; Results Pending  
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
## 2015-07-21 NOTE — Progress Notes (Addendum)
[facility] PROGRESS NOTE  Jeff Foster ZOX:[id number] DOB: [birth year] DOA: 07/18/2015 PCP: No primary care provider on file.   Brief narrative:                                                                                                                                                                                                                                                                                                                                        This is a 55 year old male with past history of moderate mental retardation and significant psychiatric history of intermittent Explosive disorder,  Paranoia, Schizophrnia, h/o CVA, COPD,  just recently discharged from hospital in [place] to Group Home in [place] around Christmas (12/24 or 12/16). At the group home pt was confused, somnolent,  total care at group home per staff, requiring 2plus assistance , unable to carry a conversation, per [name] at group home he is mostly confused, aggressive, and rarely appropriate. He was brought to [facility] 1/13 due to worsening lethargy, behavioral issues. CT head unremarkable, UA was abnormal, has been on Abx, due to increased lethargy i cut down doses of Thorazine and Depakote, since last evening waking up some, and very aggressive behavior, hitting staff. -Urine Cx and EEG negative Today Psych consulted  Assessment/Plan: Encephalopathy - possibly secondary to UTI, meds, very poor baseline too -now awake, but very aggressive, confused  On PRN Haldol, got Iv ativan PRN x1 last night per staff which helped him - Head CT scan negative for acute findings. Normal ammonia.  - EEG negative for seizures, Urine Cx polymicorbial, Stop Rocephin  - Blood and uirne cx negative,  - Baseline mentation quite poor for 3 weeks, total care at group home per staff, 2plus assistance , unable to carry a conversation, per [name] at group home he is mostly confused, aggressive, and rarely  appropriate -2days ago i cut down Depakote dose, thorazine dose, continue  Trileptal due to concern for over medication - also cut down klonopin PRN due to lethargy - SLp eval and diet - Per Staff at Group home he came with diagnosis of Moderate MR, intermittent Explosive disorder,  Paranoia, Schizophrnia, some signs of Dementia -Psych Dr.Jonalaggada consulted, Will likely need placement In Psych facility  Mental retardation. [facility] [place] [facility] [place] [facility] [place]. Over last few days he has been unable to perform ADLs. According to staff he is confused and sleeping a lot. Baseline not well known. -has been at group home for 3weeks, while there he is total care at group home per staff, 2plus assistance , unable to carry a conversation,   Sinus tachycardia, resolved. Dry mucous membranes, suspect dehyration. He is s/p 2 liters bolus in ED -Continue IVF  Dysphagia. Per Caregiver patient has difficulty swallowing fluids (comes back up). Tolerates solids. -SLP evaluation when mentation better -diet when able  Hypertension. Controlled  Transaminitis. No baseline LFTs to compare. Possibly secondary to medications.  -no abd symptoms, cmet in am  Hypothyroidism.  -continue home synthroid. -TSH WNL  GERD. -continue daily PPI  History of CVA per records brought in by Group Home.   Upon discharge patient will need 30 day supply of home medications until he can get established with PCP  CONSULTANTS: none  Code Status: Full code DVT Prophylaxis: Lovenox Family Communication: Patient lethargic, confused, unable to reach any family, i was finally able to reach a staff member from group home who told me that he has been there for 3 weeks and has been needing 2+ assistance for ADLs Vetter(sister) [phone], discussed with sister today, Case coordinator is Elly ModenaKathy Rogers[place] Tel no will be provided by Sister to RN later Disposition Plan: to be determined,  May  need SNF    HPI/Subjective: Remains lethargic, but a little better from yesterday  Objective: Filed Vitals:   07/21/15 0605 07/21/15 1451  BP: 126/78 126/72  Pulse:  80  Temp:  98.2 F (36.8 C)  Resp:  18    Intake/Output Summary (Last 24 hours) at 07/21/15 1540 Last data filed at 07/21/15 1400  Gross per 24 hour  Intake    600 ml  Output   1700 ml  Net  -1100 ml   There were no vitals filed for this visit.  Exam:   General:  More awake, still drowsy at time, crying in between  Cardiovascular: S1S2/RRR  Respiratory: CTAB  Abdomen: soft, NT, BS present  Musculoskeletal: no edema c/c  Neuro: more lucid today but unable to have a conversation  Data Reviewed: Basic Metabolic Panel:  Recent Labs Lab 07/18/15 1235 07/19/15 0514 07/20/15 0515  NA 142 142 142  K 3.9 3.8 3.9  CL 107 111 108  CO2 24 22 24   GLUCOSE 95 89 92  BUN 13 10 8   CREATININE 0.82 0.64 0.68  CALCIUM 9.0 8.3* 8.4*   Liver Function Tests:  Recent Labs Lab 07/18/15 1235 07/20/15 0515  AST 116* 68*  ALT 72* 46  ALKPHOS 53 46  BILITOT 0.8 0.6  PROT 6.7 5.4*  ALBUMIN 3.1* 2.2*   No results for input(s): LIPASE, AMYLASE in the last 168 hours.  Recent Labs Lab 07/18/15 1305  AMMONIA 24   CBC:  Recent Labs Lab 07/18/15 1235 07/19/15 0514 07/20/15 0515  WBC 11.4* 8.6 9.5  NEUTROABS 6.7  --   --   HGB 12.6* 10.7* 11.2*  HCT 38.4* 32.1* 33.4*  MCV 89.7 90.9 90.0  PLT 96* 75* 70*   Cardiac Enzymes:  Recent Labs Lab 07/18/15 2210  CKTOTAL 1514*   BNP (last 3 results) No results for input(s): BNP in the last 8760 hours.  ProBNP (last 3 results) No results for input(s): PROBNP in the last 8760 hours.  CBG:  Recent Labs Lab 07/18/15 1234  GLUCAP 92    Recent Results (from the past 240 hour(s))  Culture, blood (Routine X 2) w Reflex to ID Panel     Status: None (Preliminary result)   Collection Time: 07/18/15 12:35 PM  Result Value Ref Range Status    Specimen Description BLOOD LEFT ANTECUBITAL  Final   Special Requests BOTTLES DRAWN AEROBIC AND ANAEROBIC 5CCS  Final   Culture NO GROWTH 3 DAYS  Final   Report Status PENDING  Incomplete  Culture, blood (Routine X 2) w Reflex to ID Panel     Status: None (Preliminary result)   Collection Time: 07/18/15 12:56 PM  Result Value Ref Range Status   Specimen Description BLOOD RIGHT ANTECUBITAL  Final   Special Requests BOTTLES DRAWN AEROBIC AND ANAEROBIC 4CCS  Final   Culture NO GROWTH 3 DAYS  Final   Report Status PENDING  Incomplete  Culture, Urine     Status: None   Collection Time: 07/18/15 11:50 PM  Result Value Ref Range Status   Specimen Description URINE, RANDOM  Final   Special Requests NONE  Final   Culture MULTIPLE SPECIES PRESENT, SUGGEST RECOLLECTION  Final   Report Status 07/20/2015 FINAL  Final     Studies: No results found.  Scheduled Meds: . antiseptic oral rinse  7 mL Mouth Rinse q12n4p  . aspirin EC  81 mg Oral Daily  . atorvastatin  80 mg Oral Daily  . cefTRIAXone (ROCEPHIN)  IV  1 g Intravenous Q24H  . chlorhexidine  15 mL Mouth Rinse BID  . chlorproMAZINE  50 mg Oral TID  . divalproex  1,000 mg Oral QHS  . enoxaparin (LOVENOX) injection  40 mg Subcutaneous Q24H  . famotidine  10 mg Oral Once  . levothyroxine  25 mcg Oral QAC breakfast  . OXcarbazepine  150 mg Oral BID  . pantoprazole  40 mg Oral Daily  . scopolamine  1 patch Transdermal Q72H  . tamsulosin  0.4 mg Oral QHS   Continuous Infusions: . sodium chloride 75 mL/hr at 07/21/15 1044   Antibiotics Given (last 72 hours)    Date/Time Action Medication Dose Rate   07/19/15 1550 Given   cefTRIAXone (ROCEPHIN) 1 g in dextrose 5 % 50 mL IVPB 1 g 100 mL/hr   07/20/15 1620 Given   cefTRIAXone (ROCEPHIN) 1 g in dextrose 5 % 50 mL IVPB 1 g 100 mL/hr      Principal Problem:   Acute encephalopathy Active Problems:   UTI (lower urinary tract infection)   Cognitive decline   Mental retardation    Hypertension   History of CVA (cerebrovascular accident)   Encephalopathy acute    Time spent:    [facility]  [facility] Pager [phone]. If 7PM-7AM, please contact night-coverage at [email or web address], password [facility] 07/21/2015, 3:40 PM  LOS: 2 days

## 2015-07-21 NOTE — Evaluation (Signed)
Physical Therapy Evaluation Patient Details Name: Jeff Foster MRN: [id number] DOB: Nov 12, 1960 Today's Date: 07/21/2015   History of Present Illness  Pt admitted with acute encephalopathy. 55 y.o. male with mental retardation from group home with apparent progressive decline in function and diet tolerance  Clinical Impression  Pt in bed with eyes open on arrival. Pt able to state name but all other responses were incomprehensible. Pt stated something about his sister and was crying during session. Pt follows multimodal commands with increased time grossly 40% of the time. Pt unable to stand fully erect or take progressive steps other than pivoting during session. Pt did state "no" when asked if he walks at baseline. Pt with decreased strength, balance, transfers and mobility but unclear of baseline given no caregivers available and pt unable. Pt will benefit from acute therapy to maximize strength, balance and function to decrease caregiver burden.     Follow Up Recommendations SNF;Supervision/Assistance - 24 hour    Equipment Recommendations  Rolling walker with 5" wheels;3in1 (PT)    Recommendations for Other Services       Precautions / Restrictions Precautions Precautions: Fall Restrictions Weight Bearing Restrictions: No      Mobility  Bed Mobility Overal bed mobility: Needs Assistance Bed Mobility: Supine to Sit     Supine to sit: Mod assist     General bed mobility comments: cues for sequence and initiation with assist to move legs to EOB and elevate trunk from surface, min assist to scoot to EOB  Transfers Overall transfer level: Needs assistance   Transfers: Sit to/from Stand;Stand Pivot Transfers Sit to Stand: Min assist;+2 safety/equipment Stand pivot transfers: Mod assist;+2 physical assistance;+2 safety/equipment       General transfer comment: cues for hand placement with pt grasping the RW to stand, increased time to achieve standing, in standing maintains  flexion despite multimodal cues and hand over hand assist to move hands up on RW with cues to push through RW. Required return to sitting prior to second trial with use of pad to support pelvis with pivot to recliner  Ambulation/Gait                Stairs            Wheelchair Mobility    Modified Rankin (Stroke Patients Only)       Balance Overall balance assessment: Needs assistance   Sitting balance-Leahy Scale: Fair Sitting balance - Comments: guarding due to tendency for anterior lean in sitting EOB     Standing balance-Leahy Scale: Poor                               Pertinent Vitals/Pain Pain Assessment: Faces Pain Score: 5  Pain Location: pt tearful throughout session and stating something about his sister, unsure if pain or lability Pain Intervention(s): Repositioned;Monitored during session    Home Living Family/patient expects to be discharged to:: Skilled nursing facility                      Prior Function           Comments: Pt unable to provide PLOF, no caregivers able to provide.      Hand Dominance        Extremity/Trunk Assessment   Upper Extremity Assessment: Generalized weakness;Difficult to assess due to impaired cognition           Lower Extremity Assessment: Generalized weakness;Difficult to assess  due to impaired cognition      Cervical / Trunk Assessment: Kyphotic  Communication   Communication: Expressive difficulties  Cognition Arousal/Alertness: Awake/alert Behavior During Therapy: Flat affect Overall Cognitive Status: No family/caregiver present to determine baseline cognitive functioning                      General Comments      Exercises        Assessment/Plan    PT Assessment Patient needs continued PT services  PT Diagnosis Difficulty walking;Generalized weakness;Altered mental status   PT Problem List Decreased strength;Decreased cognition;Decreased activity  tolerance;Decreased balance;Decreased safety awareness;Decreased mobility  PT Treatment Interventions Gait training;DME instruction;Functional mobility training;Therapeutic activities;Therapeutic exercise;Balance training;Patient/family education;Cognitive remediation   PT Goals (Current goals can be found in the Care Plan section) Acute Rehab PT Goals PT Goal Formulation: Patient unable to participate in goal setting Time For Goal Achievement: 08/04/15 Potential to Achieve Goals: Fair    Frequency Min 3X/week   Barriers to discharge Decreased caregiver support      Co-evaluation               End of Session Equipment Utilized During Treatment: Gait belt Activity Tolerance: Patient tolerated treatment well Patient left: in chair;with call bell/phone within reach;with chair alarm set;with nursing/sitter in room Nurse Communication: Mobility status;Precautions         Time: [id number] PT Time Calculation (min) (ACUTE ONLY): 17 min   Charges:   PT Evaluation $PT Eval Moderate Complexity: 1 Procedure     PT G CodesDelorse Foster:        Jeff Foster, Jeff Foster 07/21/2015, 11:13 AM  Jeff Foster, PT [phone]

## 2015-07-21 NOTE — Progress Notes (Signed)
Patient received Haldol for agitation at 1703.  He slept for a short amount of time.  RN went to room to respond to bed alarm and patient was combative, hitting and kicking multiple staff members.  He was screaming foul language and  unable to follow any commands.  Notified charge nurse who has put him on the list to receive a Recruitment consultantsafety sitter.  None available currently.  Also paged Dr. Jomarie LongsJoseph regarding medication regimen. Awaiting response.

## 2015-07-22 DIAGNOSIS — F79 Unspecified intellectual disabilities: Secondary | ICD-10-CM

## 2015-07-22 MED ORDER — RISPERIDONE 0.5 MG PO TABS
0.5000 mg | ORAL_TABLET | Freq: Every day | ORAL | Status: DC
  Administered 2015-07-23 (×2): 0.5 mg via ORAL
  Filled 2015-07-22 (×3): qty 1

## 2015-07-22 MED ORDER — LORAZEPAM 2 MG/ML IJ SOLN
1.0000 mg | INTRAMUSCULAR | Status: DC | PRN
  Administered 2015-07-22 (×2): 1 mg via INTRAVENOUS
  Filled 2015-07-22 (×2): qty 1

## 2015-07-22 MED ORDER — DIVALPROEX SODIUM ER 500 MG PO TB24
1500.0000 mg | ORAL_TABLET | Freq: Every day | ORAL | Status: DC
  Administered 2015-07-23 – 2015-08-06 (×15): 1500 mg via ORAL
  Filled 2015-07-22 (×16): qty 3

## 2015-07-22 MED ORDER — CLONAZEPAM 0.5 MG PO TABS
0.5000 mg | ORAL_TABLET | Freq: Two times a day (BID) | ORAL | Status: DC
  Administered 2015-07-23 – 2015-08-07 (×32): 0.5 mg via ORAL
  Filled 2015-07-22 (×33): qty 1

## 2015-07-22 MED ORDER — LORAZEPAM 2 MG/ML IJ SOLN
1.0000 mg | Freq: Once | INTRAMUSCULAR | Status: AC
  Administered 2015-07-22: 1 mg via INTRAVENOUS
  Filled 2015-07-22: qty 1

## 2015-07-22 NOTE — Progress Notes (Signed)
Pt currently resting in bed with eyes closed. Bedside sitter present. Bed alarm also in use. No issues to report at this time

## 2015-07-22 NOTE — Progress Notes (Addendum)
[facility] PROGRESS NOTE  Jeff Foster ZOX:[id number] DOB: 03/09/1961 DOA: 07/18/2015 PCP: No primary care provider on file.   Brief narrative:                                                                                                                                                                                                                                                                                                                                        This is a 55 year old male with past history of moderate mental retardation and significant psychiatric history of intermittent Explosive disorder,  Paranoia, Schizophrnia, h/o CVA, COPD,  just recently discharged from hospital in [place] to Group Home in [place] around Christmas (12/24 or 12/16). At the group home pt was confused, somnolent,  total care at group home per staff, requiring 2plus assistance , unable to carry a conversation, per [name] at group home he is mostly confused, aggressive, and rarely appropriate. He was brought to [facility] 1/13 due to worsening lethargy, behavioral issues. CT head unremarkable, UA was abnormal, has been on Abx, due to increased lethargy i cut down doses of Thorazine and Depakote, since last evening waking up some, and very aggressive behavior, hitting staff. -Urine Cx and EEG negative Today Psych consulted  Assessment/Plan: Encephalopathy - possibly secondary to UTI, meds, very poor baseline too -now awake, but very aggressive, confused  On PRN Haldol, got Iv ativan PRN x1 last night per staff which helped him - Head CT scan negative for acute findings. Normal ammonia.  - EEG negative for seizures, Urine Cx polymicorbial, Stop Rocephin  - Blood and uirne cx negative,  - Baseline mentation quite poor for 3 weeks, total care at group home per staff, 2plus assistance , unable to carry a conversation, per [name] at group home he is mostly confused, aggressive, and rarely  appropriate -2days ago i cut down Depakote dose, stopped thorazine dose,  due to concern for over medication - SLp eval and diet when mentation improves - Per Staff at Group home he came with diagnosis of Moderate MR, intermittent Explosive disorder,  Paranoia, Schizophrnia, some signs of Dementia -Psych Dr.Jonalaggada consulted, Will likely need placement In Psych facility  -D/w Dr.J today, will increase Depakote to  QHS, add Risperidone, and Klonopin 0.5mg  BID,   Mental retardation. Recently moved to [facility] for group home placement. Over last few days he has been unable to perform ADLs. According to staff he is confused and sleeping a lot. Baseline not well known. -has been at group home for 3weeks, while there he is total care at group home per staff, 2plus assistance , unable to carry a conversation at this time   Sinus tachycardia, resolved. Dry mucous membranes, suspect dehyration. He is s/p 2 liters bolus in ED -Continue IVF  Dysphagia. Per Caregiver patient has difficulty swallowing fluids (comes back up). Tolerates solids. -SLP evaluation when mentation better -diet when able  Hypertension. Controlled  Transaminitis. No baseline LFTs to compare. Possibly secondary to medications.  -no abd symptoms, cmet in am  Hypothyroidism.  -continue home synthroid. -TSH WNL  GERD. -continue daily PPI  History of CVA per records brought in by Group Home.   Upon discharge patient will need 30 day supply of home medications until he can get established with PCP  CONSULTANTS: none  Code Status: Full code DVT Prophylaxis: Lovenox Family Communication: Patient lethargic, confused, unable to reach any family, i was finally able to reach a staff member from group home who told me that he has been there for 3 weeks and has been needing 2+ assistance for ADLs Vetter(sister) [phone], discussed with sister today, Case coordinator is Elly Modena, in [place] Tel no will  be provided by Sister to RN later Disposition Plan: to be determined,  May need SNF    HPI/Subjective: Remains lethargic, but a little better from yesterday  Objective: Filed Vitals:   07/22/15 0553 07/22/15 1353  BP: 143/91 147/87  Pulse: 99 93  Temp: 98.5 F (36.9 C) 98.6 F (37 C)  Resp: 17 18    Intake/Output Summary (Last 24 hours) at 07/22/15 1703 Last data filed at 07/22/15 1503  Gross per 24 hour  Intake      0 ml  Output   2400 ml  Net  -2400 ml   There were no vitals filed for this visit.  Exam:   General:  More awake, still drowsy at time, crying in between  Cardiovascular: S1S2/RRR  Respiratory: CTAB  Abdomen: soft, NT, BS present  Musculoskeletal: no edema c/c  Neuro: more lucid today but unable to have a conversation  Data Reviewed: Basic Metabolic Panel:  Recent Labs Lab 07/18/15 1235 07/19/15 0514 07/20/15 0515  NA 142 142 142  K 3.9 3.8 3.9  CL 107 111 108  CO2 GLUCOSE 95 89 92  BUN CREATININE 0.82 0.64 0.68  CALCIUM 9.0 8.3* 8.4*   Liver Function Tests:  Recent Labs Lab 07/18/15 1235 07/20/15 0515  AST 116* 68*  ALT 72* 46  ALKPHOS 53 46  BILITOT 0.8 0.6  PROT 6.7 5.4*  ALBUMIN 3.1* 2.2*   No results for input(s): LIPASE, AMYLASE in the last 168 hours.  Recent Labs Lab 07/18/15 1305  AMMONIA 24   CBC:  Recent Labs Lab 07/18/15 1235 07/19/15 0514 07/20/15 0515  WBC 11.4* 8.6 9.5  NEUTROABS 6.7  --   --  HGB 12.6* 10.7* 11.2*  HCT 38.4* 32.1* 33.4*  MCV 89.7 90.9 90.0  PLT 96* 75* 70*   Cardiac Enzymes:  Recent Labs Lab 07/18/15 2210  CKTOTAL 1514*   BNP (last 3 results) No results for input(s): BNP in the last 8760 hours.  ProBNP (last 3 results) No results for input(s): PROBNP in the last 8760 hours.  CBG:  Recent Labs Lab 07/18/15 1234  GLUCAP 92    Recent Results (from the past 240 hour(s))  Culture, blood (Routine X 2) w Reflex to ID Panel     Status: None  (Preliminary result)   Collection Time: 07/18/15 12:35 PM  Result Value Ref Range Status   Specimen Description BLOOD LEFT ANTECUBITAL  Final   Special Requests BOTTLES DRAWN AEROBIC AND ANAEROBIC 5CCS  Final   Culture NO GROWTH 4 DAYS  Final   Report Status PENDING  Incomplete  Culture, blood (Routine X 2) w Reflex to ID Panel     Status: None (Preliminary result)   Collection Time: 07/18/15 12:56 PM  Result Value Ref Range Status   Specimen Description BLOOD RIGHT ANTECUBITAL  Final   Special Requests BOTTLES DRAWN AEROBIC AND ANAEROBIC 4CCS  Final   Culture NO GROWTH 4 DAYS  Final   Report Status PENDING  Incomplete  Culture, Urine     Status: None   Collection Time: 07/18/15 11:50 PM  Result Value Ref Range Status   Specimen Description URINE, RANDOM  Final   Special Requests NONE  Final   Culture MULTIPLE SPECIES PRESENT, SUGGEST RECOLLECTION  Final   Report Status 07/20/2015 FINAL  Final     Studies: No results found.  Scheduled Meds: . antiseptic oral rinse  7 mL Mouth Rinse q12n4p  . aspirin EC  81 mg Oral Daily  . atorvastatin  80 mg Oral Daily  . chlorhexidine  15 mL Mouth Rinse BID  . chlorproMAZINE  50 mg Oral TID  . divalproex  1,000 mg Oral QHS  . enoxaparin (LOVENOX) injection  40 mg Subcutaneous Q24H  . famotidine  10 mg Oral Once  . levothyroxine  25 mcg Oral QAC breakfast  . OXcarbazepine  150 mg Oral BID  . pantoprazole  40 mg Oral Daily  . scopolamine  1 patch Transdermal Q72H  . tamsulosin  0.4 mg Oral QHS   Continuous Infusions: . sodium chloride 75 mL/hr at 07/21/15 2232   Antibiotics Given (last 72 hours)    Date/Time Action Medication Dose Rate   07/20/15 1620 Given   cefTRIAXone (ROCEPHIN) 1 g in dextrose 5 % 50 mL IVPB 1 g 100 mL/hr   07/21/15 1605 Given   cefTRIAXone (ROCEPHIN) 1 g in dextrose 5 % 50 mL IVPB 1 g 100 mL/hr      Principal Problem:   Acute encephalopathy Active Problems:   UTI (lower urinary tract infection)    Cognitive decline   Mental retardation   Hypertension   History of CVA (cerebrovascular accident)   Encephalopathy acute    Time spent:    [facility]  [facility] Pager [phone]. If 7PM-7AM, please contact night-coverage at [email or web address], password [facility] 07/22/2015, 5:03 PM  LOS: 3 days

## 2015-07-22 NOTE — Progress Notes (Signed)
Pt sleeping sounding in bed at this time

## 2015-07-22 NOTE — Progress Notes (Signed)
Spoke to pt's sister, Edson Snowball, phone number [phone] and she gave me the pt's care coordinator name and number . Care coordinator's name is Elly Modena and her contact number is [phone]

## 2015-07-22 NOTE — Progress Notes (Signed)
  iv ativan administered for agitation/restlessness.

## 2015-07-22 NOTE — Care Management Note (Signed)
Case Management Note  Patient Details  Name: Jeff Foster MRN: [id number] Date of Birth: 04/08/1961  Subjective/Objective:                    Action/Plan:  Patient from [facility], PT recommending SNF  Expected Discharge Date:                  Expected Discharge Plan:  Skilled Nursing Facility  In-House Referral:  Clinical Social Work  Discharge planning Services     Post Acute Care Choice:    Choice offered to:     DME Arranged:    DME Agency:     HH Arranged:    HH Agency:     Status of Service:  In process, will continue to follow  Medicare Important Message Given:    Date Medicare IM Given:    Medicare IM give by:    Date Additional Medicare IM Given:    Additional Medicare Important Message give by:     If discussed at Long Length of Stay Meetings, dates discussed:    Additional Comments:  Kingsley Plan, RN 07/22/2015, 11:28 AM

## 2015-07-22 NOTE — Consult Note (Signed)
[facility] Face-to-Face Psychiatry Consult   Reason for Consult:  Aggression and mental retardation Referring Physician:  Dr. Jomarie Longs Patient Identification: Jeff Foster MRN:  [id number] Principal Diagnosis: Acute encephalopathy Diagnosis:   Patient Active Problem List   Diagnosis Date Noted  . UTI (lower urinary tract infection) [N39.0] 07/18/2015  . Cognitive decline [R41.89] 07/18/2015  . Acute encephalopathy [G93.40] 07/18/2015  . Mental retardation [F79] 07/18/2015  . Hypertension [I10] 07/18/2015  . History of CVA (cerebrovascular accident) [Z86.73] 07/18/2015  . Encephalopathy acute [G93.40] 07/18/2015  . Dehydration [E86.0]     Total Time spent with patient: 45 minutes  Subjective:   Jeff Foster is a 55 y.o. male patient admitted with altered mental status  HPI:  Jeff Foster is a 55 y.o. male  seen, chart reviewed and case discussed with Dr. Jomarie Longs for face-to-face psychiatry consultation and evaluation of increased agitation, combative behaviors and altered mental status. Patient is a poor historian and repeatedly stated I want to go home but could not do more details. Patient is trying to get out of the bed repeatedly and required frequent redirection's. Patient came from the local group home who where he has been staying for the last 3 weeks after placement from [facility]. Patient lives with his sister in [place] before hospitalization. Reportedly patient has been suffering with chronic moderate intellectual disability and also diagnosed with intermittent explosive disorder, unknown psychosis and mood swings.  as per the group home staff Patient was able to perform ADLs until a couple of weeks ago as per group home staff. Patient has been deteriorated over the past few days and since she has been admitted to the hospital has been combative and physically attacking the staff members when to the close to him to assist. Patient's sister stated that she won't be able to care for him  any longer because she has another child with her disability. Patient has a case Production designer, theatre/television/film but has no contacts at this time.    Past Psychiatric History: Recently admitted to [facility].   Risk to Self: Is patient at risk for suicide?: No Risk to Others:   Prior Inpatient Therapy:   Prior Outpatient Therapy:    Past Medical History: No past medical history on file. No past surgical history on file. Family History: No family history on file. Family Psychiatric  History: Unknown  Social History:  History  Alcohol Use: Not on file     History  Drug Use Not on file    Social History   Social History  . Marital Status: Unknown    Spouse Name: N/A  . Number of Children: N/A  . Years of Education: N/A   Social History Main Topics  . Smoking status: Not on file  . Smokeless tobacco: Not on file  . Alcohol Use: Not on file  . Drug Use: Not on file  . Sexual Activity: Not on file   Other Topics Concern  . Not on file   Social History Narrative  . No narrative on file   Additional Social History:                          Allergies:   Allergies  Allergen Reactions  . Other Other (See Comments)    clorox bleach  . Peanut-Containing Drug Products     Labs: No results found for this or any previous visit (from the past 48 hour(s)).  Current Facility-Administered Medications  Medication Dose  Route Frequency Provider Last Rate Last Dose  . 0.9 %  sodium chloride infusion   Intravenous Continuous Meredith Pel, NP 75 mL/hr at 07/21/15 2232    . acetaminophen (TYLENOL) tablet 650 mg  650 mg Oral Q6H PRN Meredith Pel, NP   650 mg at 07/19/15 2009   Or  . acetaminophen (TYLENOL) suppository 650 mg  650 mg Rectal Q6H PRN Meredith Pel, NP      . albuterol (PROVENTIL) (2.5 MG/3ML) 0.083% nebulizer solution 3 mL  3 mL Inhalation Q6H PRN Meredith Pel, NP      . antiseptic oral rinse (CPC / CETYLPYRIDINIUM CHLORIDE 0.05%) solution 7 mL  7 mL Mouth  Rinse q12n4p Zannie Cove, MD   7 mL at 07/22/15 1113  . aspirin EC tablet 81 mg  81 mg Oral Daily Meredith Pel, NP   81 mg at 07/22/15 1114  . atorvastatin (LIPITOR) tablet 80 mg  80 mg Oral Daily Meredith Pel, NP   80 mg at 07/22/15 1114  . bisacodyl (DULCOLAX) suppository 10 mg  10 mg Rectal Daily PRN Meredith Pel, NP      . chlorhexidine (PERIDEX) 0.12 % solution 15 mL  15 mL Mouth Rinse BID Zannie Cove, MD   15 mL at 07/22/15 1115  . chlorproMAZINE (THORAZINE) tablet 50 mg  50 mg Oral TID Zannie Cove, MD   50 mg at 07/22/15 1114  . divalproex (DEPAKOTE ER) 24 hr tablet 1,000 mg  1,000 mg Oral QHS Zannie Cove, MD   1,000 mg at 07/21/15 2157  . enoxaparin (LOVENOX) injection 40 mg  40 mg Subcutaneous Q24H Meredith Pel, NP   40 mg at 07/21/15 2035  . famotidine (PEPCID) tablet 10 mg  10 mg Oral Once Rolan Lipa, NP   10 mg at 07/20/15 2324  . food thickener (THICK IT) powder   Oral PRN Zannie Cove, MD      . haloperidol lactate (HALDOL) injection 2 mg  2 mg Intravenous Q6H PRN Zannie Cove, MD   2 mg at 07/22/15 1135  . levothyroxine (SYNTHROID, LEVOTHROID) tablet 25 mcg  25 mcg Oral QAC breakfast Meredith Pel, NP   25 mcg at 07/22/15 0857  . LORazepam (ATIVAN) injection 1 mg  1 mg Intravenous Q4H PRN Zannie Cove, MD      . ondansetron [facility]) tablet 4 mg  4 mg Oral Q6H PRN Meredith Pel, NP       Or  . ondansetron [place]) injection 4 mg  4 mg Intravenous Q6H PRN Meredith Pel, NP      . OXcarbazepine (TRILEPTAL) tablet 150 mg  150 mg Oral BID Meredith Pel, NP   150 mg at 07/22/15 1115  . pantoprazole (PROTONIX) EC tablet 40 mg  40 mg Oral Daily Meredith Pel, NP   40 mg at 07/22/15 1114  . scopolamine (TRANSDERM-SCOP) 1 MG/3DAYS 1.5 mg  1 patch Transdermal Q72H Meredith Pel, NP   1.5 mg at 07/21/15 2035  . senna-docusate (Senokot-S) tablet 1 tablet  1 tablet Oral QHS PRN Meredith Pel, NP   1 tablet at 07/20/15 2103  .  tamsulosin (FLOMAX) capsule 0.4 mg  0.4 mg Oral QHS Meredith Pel, NP   0.4 mg at 07/21/15 2158    Musculoskeletal: Strength & Muscle Tone: increased Gait & Station: unsteady, unable to stand Patient leans: N/A  Psychiatric Specialty Exam: Review of Systems  Unable to perform ROS  Blood pressure 143/91, pulse 99, temperature 98.5 F (36.9 C), temperature source Oral, resp. rate 17, SpO2 99 %.There is no height or weight on file to calculate BMI.  General Appearance: Disheveled and Guarded  Eye Contact::  Poor  Speech:  Garbled and Slurred  Volume:  Decreased  Mood:  Angry and Irritable  Affect:  Non-Congruent and Inappropriate  Thought Process:  Disorganized  Orientation:  Negative  Thought Content:  Rumination  Suicidal Thoughts:  No  Homicidal Thoughts:  No  Memory:  NA  Judgement:  Poor  Insight:  Shallow  Psychomotor Activity:  Restlessness  Concentration:  Poor  Recall:  Poor  Fund of Knowledge:Poor  Language: Fair  Akathisia:  Negative  Handed:  Right  AIMS (if indicated):     Assets:  Communication Skills Desire for Improvement Financial Resources/Insurance Leisure Time Social Support  ADL's:  Impaired  Cognition: Impaired,  Moderate  Sleep:      Treatment Plan Summary: Daily contact with patient to assess and evaluate symptoms and progress in treatment and Medication management Change Depakote ER to 1500 mg daily at bedtime for aggressive behaviors We will add clonazepam 0.5 mg twice daily for anxiety Continue Haloperidol 2 mg every 6 hours as needed for combative behaviors Continue Trileptal 150 mg twice daily for mood swings Continue risperidone 0.5 mg at bedtime for psychosis Case discussed with the psychiatric LCSW who will coordinate with patient family and case manager regarding appropriate disposition Placement Continue safety sitter for fall risk and preventing to hurt himself and others Case discussed with Dr. Jomarie Longs Patient is not  appropriate for the behavioral health Hospital due to moderate intellectual disability, aggressive behaviors and patient does not benefit from therapeutic milieu   Disposition: Patient does not meet criteria for psychiatric inpatient admission. Supportive therapy provided about ongoing stressors.  Zhamir Pirro,JANARDHAHA R. 07/22/2015 12:04 PM

## 2015-07-22 NOTE — Progress Notes (Signed)
  iv haldol administered with little effect noted. Pt still restless and moving purposelessly in bed. Safety sitter at bedside

## 2015-07-22 NOTE — Progress Notes (Signed)
Pt behaviors untouched by previously administered haldol.  iv ativan administered as ordered by MD. Pt also evaluated by psych doctor this pm

## 2015-07-22 NOTE — Progress Notes (Signed)
Prn haldol administered for restlessness

## 2015-07-23 LAB — BASIC METABOLIC PANEL
ANION GAP: 9 (ref 5–15)
BUN: 7 mg/dL (ref 6–20)
CO2: 26 mmol/L (ref 22–32)
CREATININE: 0.59 mg/dL — AB (ref 0.61–1.24)
Calcium: 8.7 mg/dL — ABNORMAL LOW (ref 8.9–10.3)
Chloride: 107 mmol/L (ref 101–111)
GFR calc Af Amer: >60 mL/min (ref >60)
GFR calc non Af Amer: >60 mL/min (ref >60)
GLUCOSE: 95 mg/dL (ref 65–99)
Potassium: 3.8 mmol/L (ref 3.5–5.1)
Sodium: 142 mmol/L (ref 135–145)

## 2015-07-23 LAB — CULTURE, BLOOD (ROUTINE X 2)
CULTURE: NO GROWTH
Culture: NO GROWTH

## 2015-07-23 LAB — CBC
HCT: 32.4 % — ABNORMAL LOW (ref 39.0–52.0)
Hemoglobin: 10.9 g/dL — ABNORMAL LOW (ref 13.0–17.0)
MCH: 30 pg (ref 26.0–34.0)
MCHC: 33.6 g/dL (ref 30.0–36.0)
MCV: 89.3 fL (ref 78.0–100.0)
PLATELETS: 64 10*3/uL — AB (ref 150–400)
RBC: 3.63 MIL/uL — ABNORMAL LOW (ref 4.22–5.81)
RDW: 14.7 % (ref 11.5–15.5)
WBC: 9.4 10*3/uL (ref 4.0–10.5)

## 2015-07-23 NOTE — Progress Notes (Signed)
Late entry- yesterday evening, patient constantly climbing out of bed without assistance(bed alarm was on) and pulling on IV and Foley catheter. Pt confused, agitated and unable to be redirected or reoriented.  Obtained order for soft waist restraint and siderails up x 4 from Tama Gander, clinician on call for [facility].  Will continue to monitor.

## 2015-07-23 NOTE — Progress Notes (Signed)
PT Cancellation Note  Patient Details Name: Jeff Foster MRN: [id number] DOB: 1961-04-10   Cancelled Treatment:    Reason Eval/Treat Not Completed: Fatigue/lethargy limiting ability to participate;Medical issues which prohibited therapy. Spoke with RN and she stated although patient just received Haldol, we could attempt to work with him. Unable to fully arouse patient enough to participate in therapy. Will attempt later as time and schedule allows.    Fredrich Birks 07/23/2015, 8:44 AM

## 2015-07-23 NOTE — Progress Notes (Signed)
[facility] PROGRESS NOTE  Jeff Foster WJX:[id number] DOB: [birth year] DOA: 07/18/2015 PCP: No primary care provider on file.   HPI/Subjective: Sleepy and lethargic this morning, per report got Haldol at 8 AM. Psychiatry to evaluate the patient and adjust medications.  Brief narrative:                                                                                                                                                                                                                                                                                                                                        This is a 55 year old male with past history of moderate mental retardation and significant psychiatric history of intermittent Explosive disorder,  Paranoia, Schizophrnia, h/o CVA, COPD,  just recently discharged from hospital in [place] to Group Home in [place] around Christmas (12/24 or 12/16). At the group home pt was confused, somnolent,  total care at group home per staff, requiring 2plus assistance , unable to carry a conversation, per [name] at group home he is mostly confused, aggressive, and rarely appropriate. He was brought to [facility] 1/13 due to worsening lethargy, behavioral issues. CT head unremarkable, UA was abnormal, has been on Abx, due to increased lethargy i cut down doses of Thorazine and Depakote, since last evening waking up some, and very aggressive behavior, hitting staff. -Urine Cx and EEG negative   Assessment/Plan:  Encephalopathy - possibly secondary to UTI, meds, very poor baseline too -now awake, but very aggressive, confused  On PRN Haldol, got Iv ativan PRN x1 last night per staff which helped him - Head CT scan negative for acute findings. Normal ammonia.  - EEG negative for seizures, Urine Cx polymicorbial, Stop Rocephin  - Blood and uirne cx negative,  - Baseline mentation quite poor for 3 weeks, total care at group home per staff, 2plus  assistance , unable to carry a conversation, per [name]  at group home he is mostly confused, aggressive, and rarely appropriate -2days ago i cut down Depakote dose, stopped thorazine dose,  due to concern for over medication - SLp eval and diet when mentation improves - Per Staff at Group home he came with diagnosis of Moderate MR, intermittent Explosive disorder,  Paranoia, Schizophrnia, some signs of Dementia -Psych Dr.Jonalaggada consulted, Will likely need placement In Psych facility  -D/w Dr.J today, will increase Depakote to  QHS, add Risperidone, and Klonopin 0.5mg  BID,   Mental retardation. Recently moved to [facility] for group home placement. Over last few days he has been unable to perform ADLs. According to staff he is confused and sleeping a lot. Baseline not well known. -has been at group home for 3weeks, while there he is total care at group home per staff, 2plus assistance , unable to carry a conversation at this time   Sinus tachycardia, resolved. Dry mucous membranes, suspect dehyration. He is s/p 2 liters bolus in ED -Continue IVF  Dysphagia. Per Caregiver patient has difficulty swallowing fluids (comes back up). Tolerates solids. -SLP evaluation when mentation better -diet when able  Hypertension. Controlled  Transaminitis. No baseline LFTs to compare. Possibly secondary to medications.  -no abd symptoms, cmet in am  Hypothyroidism.  -continue home synthroid. -TSH WNL  GERD. -continue daily PPI  History of CVA per records brought in by Group Home.   Upon discharge patient will need 30 day supply of home medications until he can get established with PCP  CONSULTANTS: none  Code Status: Full code DVT Prophylaxis: Lovenox Family Communication: Patient lethargic, confused, unable to reach any family, i was finally able to reach a staff member from group home who told me that he has been there for 3 weeks and has been needing 2+ assistance  for ADLs Vetter(sister) [phone], discussed with sister today, Case coordinator is Elly Modena, in [place] Tel no will be provided by Sister to RN later Disposition Plan: to be determined,  May need SNF      Objective: Filed Vitals:   07/22/15 1353 07/23/15 0733  BP: 147/87 145/81  Pulse: 93 86  Temp: 98.6 F (37 C) 98.7 F (37.1 C)  Resp: 18 18    Intake/Output Summary (Last 24 hours) at 07/23/15 1319 Last data filed at 07/23/15 1315  Gross per 24 hour  Intake      0 ml  Output   2776 ml  Net  -2776 ml   There were no vitals filed for this visit.  Exam:   General:  More awake, still drowsy at time, crying in between  Cardiovascular: S1S2/RRR  Respiratory: CTAB  Abdomen: soft, NT, BS present  Musculoskeletal: no edema c/c  Neuro: more lucid today but unable to have a conversation  Data Reviewed: Basic Metabolic Panel:  Recent Labs Lab 07/18/15 1235 07/19/15 0514 07/20/15 0515 07/23/15 0555  NA 142 142 142 142  K 3.9 3.8 3.9 3.8  CL 107 111 108 107  CO2 GLUCOSE 95 89 92 95  BUN CREATININE 0.82 0.64 0.68 0.59*  CALCIUM 9.0 8.3* 8.4* 8.7*   Liver Function Tests:  Recent Labs Lab 07/18/15 1235 07/20/15 0515  AST 116* 68*  ALT 72* 46  ALKPHOS 53 46  BILITOT 0.8 0.6  PROT 6.7 5.4*  ALBUMIN 3.1* 2.2*   No results for input(s): LIPASE, AMYLASE in the last 168 hours.  Recent Labs Lab 07/18/15 1305  AMMONIA 24  CBC:  Recent Labs Lab 07/18/15 1235 07/19/15 0514 07/20/15 0515 07/23/15 0550  WBC 11.4* 8.6 9.5 9.4  NEUTROABS 6.7  --   --   --   HGB 12.6* 10.7* 11.2* 10.9*  HCT 38.4* 32.1* 33.4* 32.4*  MCV 89.7 90.9 90.0 89.3  PLT 96* 75* 70* 64*   Cardiac Enzymes:  Recent Labs Lab 07/18/15 2210  CKTOTAL 1514*   BNP (last 3 results) No results for input(s): BNP in the last 8760 hours.  ProBNP (last 3 results) No results for input(s): PROBNP in the last 8760 hours.  CBG:  Recent Labs Lab  07/18/15 1234  GLUCAP 92    Recent Results (from the past 240 hour(s))  Culture, blood (Routine X 2) w Reflex to ID Panel     Status: None   Collection Time: 07/18/15 12:35 PM  Result Value Ref Range Status   Specimen Description BLOOD LEFT ANTECUBITAL  Final   Special Requests BOTTLES DRAWN AEROBIC AND ANAEROBIC 5CCS  Final   Culture NO GROWTH 5 DAYS  Final   Report Status 07/23/2015 FINAL  Final  Culture, blood (Routine X 2) w Reflex to ID Panel     Status: None   Collection Time: 07/18/15 12:56 PM  Result Value Ref Range Status   Specimen Description BLOOD RIGHT ANTECUBITAL  Final   Special Requests BOTTLES DRAWN AEROBIC AND ANAEROBIC 4CCS  Final   Culture NO GROWTH 5 DAYS  Final   Report Status 07/23/2015 FINAL  Final  Culture, Urine     Status: None   Collection Time: 07/18/15 11:50 PM  Result Value Ref Range Status   Specimen Description URINE, RANDOM  Final   Special Requests NONE  Final   Culture MULTIPLE SPECIES PRESENT, SUGGEST RECOLLECTION  Final   Report Status 07/20/2015 FINAL  Final     Studies: No results found.  Scheduled Meds: . antiseptic oral rinse  7 mL Mouth Rinse q12n4p  . aspirin EC  81 mg Oral Daily  . atorvastatin  80 mg Oral Daily  . chlorhexidine  15 mL Mouth Rinse BID  . clonazePAM  0.5 mg Oral BID  . divalproex  1,500 mg Oral QHS  . enoxaparin (LOVENOX) injection  40 mg Subcutaneous Q24H  . famotidine  10 mg Oral Once  . levothyroxine  25 mcg Oral QAC breakfast  . OXcarbazepine  150 mg Oral BID  . pantoprazole  40 mg Oral Daily  . risperiDONE  0.5 mg Oral QHS  . scopolamine  1 patch Transdermal Q72H  . tamsulosin  0.4 mg Oral QHS   Continuous Infusions: . sodium chloride 75 mL/hr at 07/23/15 4098   Antibiotics Given (last 72 hours)    Date/Time Action Medication Dose Rate   07/20/15 1620 Given   cefTRIAXone (ROCEPHIN) 1 g in dextrose 5 % 50 mL IVPB 1 g 100 mL/hr   07/21/15 1605 Given   cefTRIAXone (ROCEPHIN) 1 g in dextrose 5 % 50  mL IVPB 1 g 100 mL/hr      Principal Problem:   Acute encephalopathy Active Problems:   UTI (lower urinary tract infection)   Cognitive decline   Mental retardation   Hypertension   History of CVA (cerebrovascular accident)   Encephalopathy acute    Time spent:    [facility] A  [facility] Pager [phone]. If 7PM-7AM, please contact night-coverage at [email or web address], password [facility] 07/23/2015, 1:19 PM  LOS: 4 days

## 2015-07-24 ENCOUNTER — Inpatient Hospital Stay (HOSPITAL_COMMUNITY): Payer: Medicare Other

## 2015-07-24 ENCOUNTER — Encounter (HOSPITAL_COMMUNITY): Payer: Self-pay

## 2015-07-24 MED ORDER — HALOPERIDOL LACTATE 5 MG/ML IJ SOLN: 5.0000 mg | Freq: Once | INTRAMUSCULAR | Status: DC

## 2015-07-24 MED ORDER — RISPERIDONE 1 MG PO TABS
1.0000 mg | ORAL_TABLET | Freq: Every day | ORAL | Status: DC
  Administered 2015-07-24 – 2015-07-27 (×4): 1 mg via ORAL
  Filled 2015-07-24 (×5): qty 1

## 2015-07-24 MED ORDER — ZIPRASIDONE MESYLATE 20 MG IM SOLR
10.0000 mg | INTRAMUSCULAR | Status: DC | PRN
Start: 2015-07-24 — End: 2015-08-07
  Administered 2015-07-24 – 2015-08-06 (×10): 10 mg via INTRAMUSCULAR
  Filled 2015-07-24 (×20): qty 20

## 2015-07-24 NOTE — Progress Notes (Signed)
Patient began wailing in a very desperate distraught tone, stated "Eat", RN asked him if he was hungry and he nodded and said "Yeah".  Patient has been NPO for multiple days pending a Swallow Eval that cannot be completed due to patient's condition.  RN spoke with Dr. Lendell Caprice who agreed that he needs a diet and that being so hungry makes him more aggressive and upset.  Dysphagia 2 with thickened liquids ordered.

## 2015-07-24 NOTE — Progress Notes (Signed)
MBSS complete. Full report located under chart review in imaging section.  Antanisha Mohs Paiewonsky, M.A. CCC-SLP [phone]  

## 2015-07-24 NOTE — Progress Notes (Signed)
Speech Language Pathology Treatment: Dysphagia  Patient Details Name: Jeff Foster MRN: [id number] DOB: Dec 26, 1960 Today's Date: 07/24/2015 Time: 8295-6213 SLP Time Calculation (min) (ACUTE ONLY): 25 min  Assessment / Plan / Recommendation Clinical Impression  Additional PO trials attempted after completion of MBS due to limited intake of barium. Pt without adequate pharyngeal clearance of purees, therefore liquids were administered. Pt did have increased automaticity with oral acceptance and pharyngeal trigger with small sips of thin liquid with SLP providing Total A for control of bolus size. Unfortunately, pt continues to have anterior loss of oral residue, immediate and delayed wet cough, and wet vocal quality, all concerning for airway compromise. At this time, pt has a high aspiration risk with all PO intake.   Discussed swallowing function with RN - it is unclear what pt's baseline level of function is, however suspect at least some baseline dysphagia due to family report of coughing with POs PTA. While his function may have acutely declined due to loss of functional reserve from acute illness, I am concerned that he may have more of a chronic aspiration risk.   There are no safe PO diets to be recommended following MBS and diagnostic PO trials; however, RN shares how much PO intake appears to help with aggressive behaviors. Therefore, MD may wish to consider NPO diet with use of ice chips/water protocol and/or temporary means of alternative nutrition to see if pt will make some progress during this inpatient stay. Another option may be to involve palliative care to determine GOC and comfort feeds if this has been more of an ongoing decline in function. For now, will make diet NPO. Will f/u with patient as goals continue to be addressed.   HPI HPI: Pt admitted with acute encephalopathy. 55 y.o. male with mental retardation from group home with apparent progressive decline in function and diet  tolerance. CXR 1/13 concerning for PNA.      SLP Plan  Continue with current plan of care     Recommendations  Diet recommendations: NPO Medication Administration: Via alternative means             Oral Care Recommendations: Oral care QID Follow up Recommendations:  (tba) Plan: Continue with current plan of care     GO               Jeff Foster, M.A. CCC-SLP [phone]  Jeff Foster 07/24/2015, 3:57 PM
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
## 2015-07-24 NOTE — Progress Notes (Signed)
Physical Therapy Treatment Patient Details Name: Jeff Foster MRN: [id number] DOB: 1961/05/12 Today's Date: 07/24/2015    History of Present Illness Pt admitted with acute encephalopathy. 55 y.o. male with mental retardation from group home with apparent progressive decline in function and diet tolerance    PT Comments    Pt able to state name and replied no to pain or needs. Remains with limited communication but flat affect and not aggressive throughout session. Pt assisted with tranfers OOB to chair but not following commands for additional activity or HEP. Posey and bil mittens reapplied end of session.   Follow Up Recommendations  SNF;Supervision/Assistance - 24 hour     Equipment Recommendations       Recommendations for Other Services       Precautions / Restrictions Precautions Precautions: Fall    Mobility  Bed Mobility Overal bed mobility: Needs Assistance Bed Mobility: Supine to Sit     Supine to sit: Max assist     General bed mobility comments: cues for sequence and initiation with assist to move legs to EOB and elevate trunk from surface, mod assist to scoot to EOB. pt Assisting to elevate trunk and pivot although decreased from eval potentially due to medication  Transfers Overall transfer level: Needs assistance   Transfers: Sit to/from Stand;Stand Pivot Transfers Sit to Stand: Mod assist;+2 physical assistance Stand pivot transfers: Max assist;+2 physical assistance       General transfer comment: cues for hand placement and sequence, bil knees blocked and tactile cues to initiate standing. pt able to achieve standing but did not assist with advancing feet to pivot today with max assist to rotate pelvis to chair and control descent  Ambulation/Gait                 Stairs            Wheelchair Mobility    Modified Rankin (Stroke Patients Only)       Balance Overall balance assessment: Needs assistance   Sitting balance-Leahy  Scale: Poor Sitting balance - Comments: anterior lean in sitting, once in chair right lean     Standing balance-Leahy Scale: Poor                      Cognition Arousal/Alertness: Awake/alert Behavior During Therapy: Flat affect Overall Cognitive Status: No family/caregiver present to determine baseline cognitive functioning                      Exercises      General Comments        Pertinent Vitals/Pain Pain Location: pt unable to state and does not exhibit signs of pain    Home Living                      Prior Function            PT Goals (current goals can now be found in the care plan section) Progress towards PT goals: Not progressing toward goals - comment (limited due to lethargy presumed due to Haldol 0350)    Frequency  Min 2X/week    PT Plan Current plan remains appropriate;Frequency needs to be updated    Co-evaluation             End of Session Equipment Utilized During Treatment: Gait belt Activity Tolerance: Patient tolerated treatment well Patient left: in chair;with call bell/phone within reach;with chair alarm set;with restraints reapplied  Time: 1610-9604 PT Time Calculation (min) (ACUTE ONLY): 13 min  Charges:  $Therapeutic Activity: 8-22 mins                    G Codes:      [name] August 21, 2015, 9:55 AM Delaney Meigs, PT [phone]
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
## 2015-07-25 DIAGNOSIS — R4182 Altered mental status, unspecified: Secondary | ICD-10-CM | POA: Insufficient documentation

## 2015-07-25 DIAGNOSIS — Z66 Do not resuscitate: Secondary | ICD-10-CM

## 2015-07-25 DIAGNOSIS — Z515 Encounter for palliative care: Secondary | ICD-10-CM

## 2015-07-25 DIAGNOSIS — J690 Pneumonitis due to inhalation of food and vomit: Secondary | ICD-10-CM | POA: Diagnosis present

## 2015-07-25 DIAGNOSIS — R1310 Dysphagia, unspecified: Secondary | ICD-10-CM

## 2015-07-25 LAB — COMPREHENSIVE METABOLIC PANEL
ALBUMIN: 2.5 g/dL — AB (ref 3.5–5.0)
ALK PHOS: 48 U/L (ref 38–126)
ALT: 56 U/L (ref 17–63)
AST: 63 U/L — AB (ref 15–41)
Anion gap: 9 (ref 5–15)
BILIRUBIN TOTAL: 0.6 mg/dL (ref 0.3–1.2)
BUN: 8 mg/dL (ref 6–20)
CALCIUM: 8.5 mg/dL — AB (ref 8.9–10.3)
CO2: 24 mmol/L (ref 22–32)
CREATININE: 0.6 mg/dL — AB (ref 0.61–1.24)
Chloride: 111 mmol/L (ref 101–111)
GFR calc Af Amer: >60 mL/min (ref >60)
GFR calc non Af Amer: >60 mL/min (ref >60)
GLUCOSE: 94 mg/dL (ref 65–99)
Potassium: 3.3 mmol/L — ABNORMAL LOW (ref 3.5–5.1)
SODIUM: 144 mmol/L (ref 135–145)
TOTAL PROTEIN: 6 g/dL — AB (ref 6.5–8.1)

## 2015-07-25 LAB — VALPROIC ACID LEVEL: Valproic Acid Lvl: 93 ug/mL (ref 50.0–100.0)

## 2015-07-25 MED ORDER — LORAZEPAM 2 MG/ML IJ SOLN
0.5000 mg | Freq: Once | INTRAMUSCULAR | Status: AC
  Administered 2015-07-25: 0.5 mg via INTRAVENOUS
  Filled 2015-07-25: qty 1

## 2015-07-25 NOTE — Progress Notes (Signed)
Speech Language Pathology Treatment: Dysphagia  Patient Details Name: Jeff Foster MRN: [id number] DOB: 03-16-1961 Today's Date: 07/25/2015 Time: 6213-0865 SLP Time Calculation (min) (ACUTE ONLY): 16 min  Assessment / Plan / Recommendation Clinical Impression  Pt has delayed, wet cough with all PO consistencies provided, including ice chips, thin liquids, and purees. His swallow function appears grossly unchanged from previous date. Options for recommendations discussed with palliative care: strict NPO (including medication via alternative means), NPO but with the water protocol to assess for improvement as he remains in house, or comfort feeding (Dys 1 diet, thin liquids) should pt's HCPOA decide to accept the risks of aspiration. Will keep him NPO for now and continue to follow up.   HPI HPI: Pt admitted with acute encephalopathy. 55 y.o. male with mental retardation from group home with apparent progressive decline in function and diet tolerance. CXR 1/13 concerning for PNA.      SLP Plan  Continue with current plan of care     Recommendations  Diet recommendations: NPO Medication Administration: Via alternative means             Oral Care Recommendations: Oral care QID Follow up Recommendations:  (tba) Plan: Continue with current plan of care     GO               Jeff Foster, M.A. CCC-SLP [phone]  Jeff Foster 07/25/2015, 10:32 AM

## 2015-07-25 NOTE — Progress Notes (Signed)
CSW contacted the Pt's Guardian for Pt's SSN#. Pt's guardian sent Pt's SSN card with him to the group home with the Pt.  CSW spoke with [name] [phone] to see if he would be able to provide Pt's SSN.   CSW obtained Pt's social and will continue to work patient up for d/c planning to SNF.   Leron Croak LCSWA  [facility] [phone]
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
## 2015-07-25 NOTE — Progress Notes (Signed)
[facility] PROGRESS NOTE  Jeff Foster ZOX:[id number] DOB: 06-29-1961 DOA: 07/18/2015 PCP: No primary care provider on file.   HPI/Subjective: Per nursing staff, appears more appropriate today, intermittently agitated. Able to answer simple questions, oriented to self only  Brief narrative:                                                                                                                                                                                                                                                                                                                                        This is a 55 year old male with past history of moderate mental retardation and significant psychiatric history of intermittent Explosive disorder,  Paranoia, Schizophrnia, h/o CVA, COPD,  just recently discharged from hospital in [place] to Group Home in [place] around Christmas (12/24 or 12/16). At the group home pt was confused, somnolent,  total care at group home per staff, requiring 2plus assistance , unable to carry a conversation, per [name] at group home he is mostly confused, aggressive, and rarely appropriate. He was brought to [facility] 1/13 due to worsening lethargy, behavioral issues. CT head unremarkable, UA was abnormal, has been on Abx, due to increased lethargy i cut down doses of Thorazine and Depakote, since last evening waking up some, and very aggressive behavior, hitting staff. -Urine Cx and EEG negative   Assessment/Plan:  Encephalopathy - possibly secondary to UTI, meds, very poor baseline too -now awake, but very aggressive, confused  On PRN Haldol, got Iv ativan PRN x1 last night per staff which helped him - Head CT scan negative for acute findings. Normal ammonia.  - EEG negative for seizures, Urine Cx polymicorbial, Stop Rocephin  - Blood and uirne cx negative,  - Baseline mentation quite poor for 3 weeks, total care at group home per staff,  2plus assistance , unable to carry a conversation, per [name] at group  home he is mostly confused, aggressive, and rarely appropriate -2days ago i cut down Depakote dose, stopped thorazine dose,  due to concern for over medication - SLp eval and diet when mentation improves - Per Staff at Group home he came with diagnosis of Moderate MR, intermittent Explosive disorder,  Paranoia, Schizophrnia, some signs of Dementia -Depakote to 1500mg  QHS, add Risperidone, and Klonopin 0.5mg  BID. -Psychiatry adjusted medications.  Dysphagia:  -Appreciate relative consultation, appears to be chronic dysphagia, family wants him to have thinly chopped food. -Has had a stroke in March 2015.  Mental retardation.    Sinus tachycardia, resolved. Dry mucous membranes, suspect dehyration. He is s/p 2 liters bolus in ED -Continue IVF  Hypertension. Controlled  Transaminitis. No baseline LFTs to compare. Possibly secondary to medications.  -no abd symptoms, cmet in am  Hypothyroidism.  -continue home synthroid. -TSH WNL  GERD. -continue daily PPI  History of CVA per records brought in by Group Home.   Hypokalemia Replete with oral supplements, check BMP in a.m.  Thrombocytopenia Platelets 64, was 96 on admission, continue to follow closely.   Palliative care consulted, DNR/DNI and no artificial feeding, allowed to eat  CONSULTANTS: psychiatry, palliative  Code Status: Full code DVT Prophylaxis: Lovenox Family Communication:  Disposition Plan: await palliative eval     Objective: Filed Vitals:   07/24/15 2326 07/25/15 0414  BP: 134/85 134/80  Pulse: 89 78  Temp: 98.8 F (37.1 C) 98.5 F (36.9 C)  Resp: 18 18    Intake/Output Summary (Last 24 hours) at 07/25/15 1443 Last data filed at 07/25/15 1610  Gross per 24 hour  Intake   1026 ml  Output    800 ml  Net    226 ml   There were no vitals filed for this visit.  Exam:   General:  Alert. Intermittently  agitated. Posey belt and mittens  Cardiovascular: S1S2/RRR  Respiratory: CTAB  Abdomen: soft, NT, BS present  Musculoskeletal: no edema c/c  Data Reviewed: Basic Metabolic Panel:  Recent Labs Lab 07/19/15 0514 07/20/15 0515 07/23/15 0555 07/25/15 0522  NA 142 142 142 144  K 3.8 3.9 3.8 3.3*  CL 111 108 107 111  CO2 22 24 26 24   GLUCOSE 89 92 95 94  BUN 10 8 7 8   CREATININE 0.64 0.68 0.59* 0.60*  CALCIUM 8.3* 8.4* 8.7* 8.5*   Liver Function Tests:  Recent Labs Lab 07/20/15 0515 07/25/15 0522  AST 68* 63*  ALT 46 56  ALKPHOS 46 48  BILITOT 0.6 0.6  PROT 5.4* 6.0*  ALBUMIN 2.2* 2.5*   No results for input(s): LIPASE, AMYLASE in the last 168 hours. No results for input(s): AMMONIA in the last 168 hours. CBC:  Recent Labs Lab 07/19/15 0514 07/20/15 0515 07/23/15 0550  WBC 8.6 9.5 9.4  HGB 10.7* 11.2* 10.9*  HCT 32.1* 33.4* 32.4*  MCV 90.9 90.0 89.3  PLT 75* 70* 64*   Cardiac Enzymes:  Recent Labs Lab 07/18/15 2210  CKTOTAL 1514*   BNP (last 3 results) No results for input(s): BNP in the last 8760 hours.  ProBNP (last 3 results) No results for input(s): PROBNP in the last 8760 hours.  CBG: No results for input(s): GLUCAP in the last 168 hours.  Recent Results (from the past 240 hour(s))  Culture, blood (Routine X 2) w Reflex to ID Panel     Status: None   Collection Time: 07/18/15 12:35 PM  Result Value Ref Range Status   Specimen Description BLOOD  LEFT ANTECUBITAL  Final   Special Requests BOTTLES DRAWN AEROBIC AND ANAEROBIC 5CCS  Final   Culture NO GROWTH 5 DAYS  Final   Report Status 07/23/2015 FINAL  Final  Culture, blood (Routine X 2) w Reflex to ID Panel     Status: None   Collection Time: 07/18/15 12:56 PM  Result Value Ref Range Status   Specimen Description BLOOD RIGHT ANTECUBITAL  Final   Special Requests BOTTLES DRAWN AEROBIC AND ANAEROBIC 4CCS  Final   Culture NO GROWTH 5 DAYS  Final   Report Status 07/23/2015 FINAL  Final   Culture, Urine     Status: None   Collection Time: 07/18/15 11:50 PM  Result Value Ref Range Status   Specimen Description URINE, RANDOM  Final   Special Requests NONE  Final   Culture MULTIPLE SPECIES PRESENT, SUGGEST RECOLLECTION  Final   Report Status 07/20/2015 FINAL  Final     Studies: Dg Swallowing Func-speech Pathology  07/24/2015  Objective Swallowing Evaluation:   Patient Details Name: Jeff Foster MRN: [id number] Date of Birth: [birth year] Today's Date: 07/24/2015 Time: SLP Start Time (ACUTE ONLY): 1422-SLP Stop Time (ACUTE ONLY): 1440 SLP Time Calculation (min) (ACUTE ONLY): 18 min Past Medical History: No past medical history on file. Past Surgical History: No past surgical history on file. HPI: Pt admitted with acute encephalopathy. 55 y.o. male with mental retardation from group home with apparent progressive decline in function and diet tolerance. CXR 1/13 concerning for PNA. Subjective: pt lethargic Assessment / Plan / Recommendation CHL IP CLINICAL IMPRESSIONS 07/24/2015 Therapy Diagnosis Severe oral phase dysphagia;Severe pharyngeal phase dysphagia Clinical Impression Pt has a severe oropharyngeal dysphagia with limited assessment. He has weak and ineffective lingual manipulation for containment of thin liquids, resulting in almost all liquids spilling anteriorly from his oral cavity. Puree boluses have poor cohesion, but with Max cues from SLP and assistance from RN, small amounts of puree are posteriorly transited to the pharynx. It takes Max cues to elicit a pharyngeal swallow response with puree sitting in the valleculae during significant delay. When he does ultimately swallow, he does not appear to have any appreciable transfer from the pharynx to the esophagus, with the entire bolus staying in the valleculae, base of tongue, and oral cavity. Delayed coughing not captured on camera is concerning for aspiration after the swallow on residue. Additional boluses were attempted after  completion of testing (please see treatment note for further details), but NPO status is recommended at this time due to severe aspiration risk. Impact on safety and function Severe aspiration risk;Risk for inadequate nutrition/hydration   CHL IP TREATMENT RECOMMENDATION 07/24/2015 Treatment Recommendations Therapy as outlined in treatment plan below   Prognosis 07/24/2015 Prognosis for Safe Diet Advancement Guarded Barriers to Reach Goals Other (Comment) Barriers/Prognosis Comment -- CHL IP DIET RECOMMENDATION 07/24/2015 SLP Diet Recommendations NPO Liquid Administration via -- Medication Administration Via alternative means Compensations -- Postural Changes --   CHL IP OTHER RECOMMENDATIONS 07/24/2015 Recommended Consults -- Oral Care Recommendations Oral care QID Other Recommendations --   CHL IP FOLLOW UP RECOMMENDATIONS 07/24/2015 Follow up Recommendations (No Data)   CHL IP FREQUENCY AND DURATION 07/24/2015 Speech Therapy Frequency (ACUTE ONLY) min 2x/week Treatment Duration 2 weeks      CHL IP ORAL PHASE 07/24/2015 Oral Phase Impaired Oral - Pudding Teaspoon -- Oral - Pudding Cup -- Oral - Honey Teaspoon -- Oral - Honey Cup -- Oral - Nectar Teaspoon -- Oral - Nectar Cup -- Oral - Nectar Straw --  Oral - Thin Teaspoon Weak lingual manipulation;Reduced posterior propulsion;Left anterior bolus loss;Right anterior bolus loss;Decreased bolus cohesion;Lingual/palatal residue Oral - Thin Cup -- Oral - Thin Straw -- Oral - Puree Weak lingual manipulation;Reduced posterior propulsion;Left anterior bolus loss;Right anterior bolus loss;Decreased bolus cohesion;Lingual/palatal residue;Delayed oral transit Oral - Mech Soft -- Oral - Regular -- Oral - Multi-Consistency -- Oral - Pill -- Oral Phase - Comment --  CHL IP PHARYNGEAL PHASE 07/24/2015 Pharyngeal Phase Impaired Pharyngeal- Pudding Teaspoon -- Pharyngeal -- Pharyngeal- Pudding Cup -- Pharyngeal -- Pharyngeal- Honey Teaspoon -- Pharyngeal -- Pharyngeal- Honey Cup --  Pharyngeal -- Pharyngeal- Nectar Teaspoon -- Pharyngeal -- Pharyngeal- Nectar Cup -- Pharyngeal -- Pharyngeal- Nectar Straw -- Pharyngeal -- Pharyngeal- Thin Teaspoon NT Pharyngeal -- Pharyngeal- Thin Cup -- Pharyngeal -- Pharyngeal- Thin Straw -- Pharyngeal -- Pharyngeal- Puree Delayed swallow initiation-vallecula;Reduced pharyngeal peristalsis;Reduced epiglottic inversion;Reduced anterior laryngeal mobility;Reduced laryngeal elevation;Reduced airway/laryngeal closure;Reduced tongue base retraction;Pharyngeal residue - valleculae Pharyngeal -- Pharyngeal- Mechanical Soft -- Pharyngeal -- Pharyngeal- Regular -- Pharyngeal -- Pharyngeal- Multi-consistency -- Pharyngeal -- Pharyngeal- Pill -- Pharyngeal -- Pharyngeal Comment --  CHL IP CERVICAL ESOPHAGEAL PHASE 07/24/2015 Cervical Esophageal Phase (No Data) Pudding Teaspoon -- Pudding Cup -- Honey Teaspoon -- Honey Cup -- Nectar Teaspoon -- Nectar Cup -- Nectar Straw -- Thin Teaspoon -- Thin Cup -- Thin Straw -- Puree -- Mechanical Soft -- Regular -- Multi-consistency -- Pill -- Cervical Esophageal Comment -- No flowsheet data found. Maxcine Ham, M.A. CCC-SLP [phone] Maxcine Ham 07/24/2015, 3:53 PM               Scheduled Meds: . antiseptic oral rinse  7 mL Mouth Rinse q12n4p  . aspirin EC  81 mg Oral Daily  . atorvastatin  80 mg Oral Daily  . chlorhexidine  15 mL Mouth Rinse BID  . clonazePAM  0.5 mg Oral BID  . divalproex  1,500 mg Oral QHS  . enoxaparin (LOVENOX) injection  40 mg Subcutaneous Q24H  . famotidine  10 mg Oral Once  . levothyroxine  25 mcg Oral QAC breakfast  . OXcarbazepine  150 mg Oral BID  . pantoprazole  40 mg Oral Daily  . risperiDONE  1 mg Oral QHS  . scopolamine  1 patch Transdermal Q72H  . tamsulosin  0.4 mg Oral QHS   Continuous Infusions: . sodium chloride 75 mL/hr at 07/25/15 0510   Antibiotics Given (last 72 hours)    None      Principal Problem:   Acute encephalopathy Active Problems:   UTI  (lower urinary tract infection)   Cognitive decline   Mental retardation   Hypertension   History of CVA (cerebrovascular accident)   Encephalopathy acute   DNR (do not resuscitate)   Aspiration pneumonia [facility])   Palliative care encounter   Dysphagia   Time spent:  [facility]  [facility]  [email or web address], password [facility] 07/25/2015, 2:43 PM  LOS: 6 days

## 2015-07-25 NOTE — Consult Note (Signed)
Consultation Note Date: 07/25/2015   Patient Name: Jeff Foster  DOB: 1960-10-01  MRN: [id number]  Age / Sex: 55 y.o., male  PCP: No primary care provider on file. Referring Physician: Clydia Llano, MD  Reason for Consultation: Establishing goals of care    Clinical Assessment/Narrative: Jeff Foster is a 55 yo male with mental retardation, paranoid schizophrenia, and intermittent explosive disorder.  He was admitted to [facility] on 1/13 after being lethargic and confused at his group home for several days.  Reportedly he had trouble swallowing liquids (he would regurgitate them).  On admission he was found to have pneumonia, possible UTI, transaminitis, and thrombocytopenia.  Jeff Foster was transferred from a group home in [place] to a group home in our area about 4 weeks ago.  From reading the chart it appears that for the last several weeks he was a 2 person assist for ambulation, total care and unable to carry on a conversation.  I'm uncertain of his previous baseline.  Jeff Foster has failed a swallow study and is considered a very high risk for aspiration with any oral intake.  On my exam this am he coughed about 1 minute after eating a very small bite of vanilla ice cream.   TR[facility] consulted PM as the patient is NPO.  I spoke with Jeff Foster - care coordinator and obtained the number for Jeff Foster and Legal Guardian, Jeff Foster:  [phone].  Ms Caprice Foster is the Jeff Foster but is unable to take care of him personally.    Per Jeff Foster the patient has been aspirating for 15 years.  He has had aspiration PNA in the past.  The patient had a stroke in March of 2015 and his temperament and functional abilities worsened.  Per Ms Caprice Foster, when he had his stroke - he had a long hospitalization.  He was aspirating and was NPO.  When he was finally made comfort care - he was allowed to eat again and made a miraculous recovery.   Jeff Foster understands that the patient will likely continue to have aspiration issues and recurrent pneumonia.  She understands the risk of choking.  Her strong request is to feed her brother finely chopped, soft, moist food.   She also requested to be called with his progress.  We discussed code status.  He is a DNR/DNI.  No artificial feeding.   Contacts/Participants in Discussion: Jeff Foster/Guardian over the phone. Primary Decision Maker:  Jeff Foster. Relationship to Patient :  Foster HCPOA:  Foster:  Jeff Foster - [phone]   SUMMARY OF RECOMMENDATIONS  He is a DNR/DNI.  No artificial feeding.  Feed - finely chopped, moist diet with asp precautions and assistance.  Foster says he does better with a straw.    Code Status/Advance Care Planning: DNR    Code Status Orders        Start     Ordered   07/18/15 2050  Full code   Continuous     07/18/15 2049    Code Status History    Date Active Date Inactive Code Status Order ID Comments User Context   This patient has a current code status but no historical code status.      Other Directives:None  Symptom Management:   Per primary team  Palliative Prophylaxis:   Aspiration  Additional Recommendations (Limitations, Scope, Preferences):  No Artificial Feeding   DNR / DNI   Psycho-social/Spiritual:  Support System: Adequate Desire for further Chaplaincy support: No Additional  Recommendations:  Prognosis: Unable to determine  Discharge Planning: Return to group home when able.   Chief Complaint/ Primary Diagnoses: Present on Admission:  . UTI (lower urinary tract infection) . Cognitive decline . Encephalopathy acute  I have reviewed the medical record, interviewed the patient and family, and examined the patient. The following aspects are pertinent.  No past medical history on file. Social History   Social History  . Marital Status: Unknown    Spouse Name: N/A  . Number of  Children: N/A  . Years of Education: N/A   Social History Main Topics  . Smoking status: Not on file  . Smokeless tobacco: Not on file  . Alcohol Use: Not on file  . Drug Use: Not on file  . Sexual Activity: Not on file   Other Topics Concern  . Not on file   Social History Narrative  . No narrative on file   No family history on file. Scheduled Meds: . antiseptic oral rinse  7 mL Mouth Rinse q12n4p  . aspirin EC  81 mg Oral Daily  . atorvastatin  80 mg Oral Daily  . chlorhexidine  15 mL Mouth Rinse BID  . clonazePAM  0.5 mg Oral BID  . divalproex  1,500 mg Oral QHS  . enoxaparin (LOVENOX) injection  40 mg Subcutaneous Q24H  . famotidine  10 mg Oral Once  . levothyroxine  25 mcg Oral QAC breakfast  . OXcarbazepine  150 mg Oral BID  . pantoprazole  40 mg Oral Daily  . risperiDONE  1 mg Oral QHS  . scopolamine  1 patch Transdermal Q72H  . tamsulosin  0.4 mg Oral QHS   Continuous Infusions: . sodium chloride 75 mL/hr at 07/25/15 0510   PRN Meds:.acetaminophen **OR** acetaminophen, albuterol, bisacodyl, food thickener, ondansetron **OR** ondansetron (ZOFRAN) IV, senna-docusate, ziprasidone Medications Prior to Admission:  Prior to Admission medications   Medication Sig Start Date End Date Taking? Authorizing Provider  albuterol (PROVENTIL HFA;VENTOLIN HFA) 108 (90 Base) MCG/ACT inhaler Inhale 2 puffs into the lungs every 6 (six) hours as needed for wheezing or shortness of breath.   Yes Historical Provider, MD  aspirin EC 81 MG tablet Take 81 mg by mouth daily.   Yes Historical Provider, MD  atorvastatin (LIPITOR) 80 MG tablet Take 80 mg by mouth daily.   Yes Historical Provider, MD  chlorproMAZINE (THORAZINE) 100 MG tablet Take 100 mg by mouth 3 (three) times daily.   Yes Historical Provider, MD  clonazePAM (KLONOPIN) 1 MG tablet Take 1 mg by mouth 2 (two) times daily.   Yes Historical Provider, MD  divalproex (DEPAKOTE ER) 250 MG 24 hr tablet Take 1,750 mg by mouth at  bedtime.   Yes Historical Provider, MD  esomeprazole (NEXIUM) 40 MG capsule Take 40 mg by mouth daily at 12 noon.   Yes Historical Provider, MD  levothyroxine (SYNTHROID, LEVOTHROID) 25 MCG tablet Take 25 mcg by mouth daily before breakfast.   Yes Historical Provider, MD  LORazepam (ATIVAN) 1 MG tablet Take 1 mg by mouth every 6 (six) hours as needed for anxiety.   Yes Historical Provider, MD  OXcarbazepine (TRILEPTAL) 150 MG tablet Take 150 mg by mouth 2 (two) times daily.   Yes Historical Provider, MD  scopolamine (TRANSDERM-SCOP) 1 MG/3DAYS Place 1 patch onto the skin every 3 (three) days.   Yes Historical Provider, MD  tamsulosin (FLOMAX) 0.4 MG CAPS capsule Take 0.4 mg by mouth at bedtime.   Yes Historical Provider, MD  Allergies  Allergen Reactions  . Other Other (See Comments)    clorox bleach  . Peanut-Containing Drug Products     Review of Systems  Unable.  Physical Exam  Well developed, with MR.  Unable to form intelligible speech CV Brady, Regular, no M/R/G Resp:  No increased work of breathing.  No wheeze Abdomen:  Thin, Nt, Nd, + Bs Extremities:  In soft restraints.  No edema.  Vital Signs: BP 134/80 mmHg  Pulse 78  Temp(Src) 98.5 F (36.9 C) (Oral)  Resp 18  SpO2 98%  SpO2: SpO2: 98 % O2 Device:SpO2: 98 % O2 Flow Rate: .   IO: Intake/output summary:  Intake/Output Summary (Last 24 hours) at 07/25/15 1040 Last data filed at 07/25/15 0454  Gross per 24 hour  Intake   1146 ml  Output   1100 ml  Net     46 ml    LBM: Last BM Date: 07/23/15 Baseline Weight:   Most recent weight:        Palliative Assessment/Data:  Flowsheet Rows        Most Recent Value   Intake Tab    Referral Department  Hospitalist   Unit at Time of Referral  Med/Surg Unit   Palliative Care Primary Diagnosis  Neurology   Date Notified  07/25/15   Palliative Care Type  New Palliative care   Date of Admission  07/18/15   Date first seen by Palliative Care  07/25/15   # of days  Palliative referral response time  0 Day(s)   # of days IP prior to Palliative referral  7   Clinical Assessment    Palliative Performance Scale Score  20%   Pain Min Last 24 hours  4   Psychosocial & Spiritual Assessment    Palliative Care Outcomes    Palliative Care follow-up planned  Yes, Facility      Additional Data Reviewed:  CBC:    Component Value Date/Time   WBC 9.4 07/23/2015 0550   HGB 10.9* 07/23/2015 0550   HCT 32.4* 07/23/2015 0550   PLT 64* 07/23/2015 0550   MCV 89.3 07/23/2015 0550   NEUTROABS 6.7 07/18/2015 1235   LYMPHSABS 1.9 07/18/2015 1235   MONOABS 1.7* 07/18/2015 1235   EOSABS 1.1* 07/18/2015 1235   BASOSABS 0.0 07/18/2015 1235   Comprehensive Metabolic Panel:    Component Value Date/Time   NA 144 07/25/2015 0522   K 3.3* 07/25/2015 0522   CL 111 07/25/2015 0522   CO2 24 07/25/2015 0522   BUN 8 07/25/2015 0522   CREATININE 0.60* 07/25/2015 0522   GLUCOSE 94 07/25/2015 0522   CALCIUM 8.5* 07/25/2015 0522   AST 63* 07/25/2015 0522   ALT 56 07/25/2015 0522   ALKPHOS 48 07/25/2015 0522   BILITOT 0.6 07/25/2015 0522   PROT 6.0* 07/25/2015 0522   ALBUMIN 2.5* 07/25/2015 0522     Time In: 10:00 Time Out: 11:38 Time Total: 98 min. Greater than 75%  of this time was spent counseling and coordinating care related to the above assessment and plan.  Signed by: Algis Downs, PA-C Palliative Medicine Pager: [phone]  07/25/2015, 10:40 AM  Please contact Palliative Medicine Team phone at [phone] for questions and concerns.

## 2015-07-26 NOTE — Progress Notes (Signed)
Geodon requested from pharmacy. Med not available in pyxis

## 2015-07-26 NOTE — Progress Notes (Signed)
[facility] PROGRESS NOTE  Jeff Foster ZOX:[id number] DOB: August 26, 1960 DOA: 07/18/2015 PCP: No primary care provider on file.   HPI/Subjective: Awake, alert, mumbling about "bad news" reported that his "sister died last week" Await group home evaluation to return over there. Meanwhile psych adjusting medications  Brief narrative:                                                                                                                                                                                                                                                                                                                                        This is a 55 year old male with past history of moderate mental retardation and significant psychiatric history of intermittent Explosive disorder,  Paranoia, Schizophrnia, h/o CVA, COPD,  just recently discharged from hospital in [place] to Group Home in [place] around Christmas (12/24 or 12/16). At the group home pt was confused, somnolent,  total care at group home per staff, requiring 2plus assistance , unable to carry a conversation, per [name] at group home he is mostly confused, aggressive, and rarely appropriate. He was brought to [facility] 1/13 due to worsening lethargy, behavioral issues. CT head unremarkable, UA was abnormal, has been on Abx, due to increased lethargy i cut down doses of Thorazine and Depakote, since last evening waking up some, and very aggressive behavior, hitting staff. -Urine Cx and EEG negative   Assessment/Plan:  Encephalopathy - possibly secondary to UTI, meds, very poor baseline too -now awake, but very aggressive, confused  On PRN Haldol, got Iv ativan PRN x1 last night per staff which helped him - Head CT scan negative for acute findings. Normal ammonia.  - EEG negative for seizures, Urine Cx polymicorbial, Stop Rocephin  - Blood and uirne cx negative,  - Baseline mentation quite poor for 3  weeks, total care at group home per staff, 2plus assistance , unable to carry  a conversation, per [name] at group home he is mostly confused, aggressive, and rarely appropriate -2days ago i cut down Depakote dose, stopped thorazine dose,  due to concern for over medication - SLp eval and diet when mentation improves - Per Staff at Group home he came with diagnosis of Moderate MR, intermittent Explosive disorder,  Paranoia, Schizophrnia, some signs of Dementia -Depakote to 1500mg  QHS, add Risperidone, and Klonopin 0.5mg  BID. -Psychiatry adjusted medications.  Dysphagia:  -Appreciate relative consultation, appears to be chronic dysphagia, family wants him to have thinly chopped food. -Has had a stroke in March 2015.  Mental retardation.    Sinus tachycardia, resolved. Dry mucous membranes, suspect dehyration. He is s/p 2 liters bolus in ED -Continue IVF  Hypertension. Controlled  Transaminitis. No baseline LFTs to compare. Possibly secondary to medications.  -no abd symptoms, cmet in am  Hypothyroidism.  -continue home synthroid. -TSH WNL  GERD. -continue daily PPI  History of CVA per records brought in by Group Home.   Hypokalemia Replete with oral supplements, check BMP in a.m.  Thrombocytopenia Platelets 64, was 96 on admission, continue to follow closely.   Palliative care consulted, DNR/DNI and no artificial feeding, allowed to eat  CONSULTANTS: psychiatry, palliative  Code Status: Full code DVT Prophylaxis: Lovenox Family Communication:  Disposition Plan: await palliative eval     Objective: Filed Vitals:   07/26/15 0023 07/26/15 1331  BP: 138/81 161/64  Pulse: 63 90  Temp: 98.5 F (36.9 C) 98.7 F (37.1 C)  Resp: 18 18    Intake/Output Summary (Last 24 hours) at 07/26/15 1343 Last data filed at 07/26/15 1331  Gross per 24 hour  Intake 2127.5 ml  Output   2550 ml  Net -422.5 ml   There were no vitals filed for this  visit.  Exam:   General:  Alert. Intermittently agitated. Posey belt and mittens  Cardiovascular: S1S2/RRR  Respiratory: CTAB  Abdomen: soft, NT, BS present  Musculoskeletal: no edema c/c  Data Reviewed: Basic Metabolic Panel:  Recent Labs Lab 07/20/15 0515 07/23/15 0555 07/25/15 0522  NA 142 142 144  K 3.9 3.8 3.3*  CL 108 107 111  CO2 24 26 24   GLUCOSE 92 95 94  BUN 8 7 8   CREATININE 0.68 0.59* 0.60*  CALCIUM 8.4* 8.7* 8.5*   Liver Function Tests:  Recent Labs Lab 07/20/15 0515 07/25/15 0522  AST 68* 63*  ALT 46 56  ALKPHOS 46 48  BILITOT 0.6 0.6  PROT 5.4* 6.0*  ALBUMIN 2.2* 2.5*   No results for input(s): LIPASE, AMYLASE in the last 168 hours. No results for input(s): AMMONIA in the last 168 hours. CBC:  Recent Labs Lab 07/20/15 0515 07/23/15 0550  WBC 9.5 9.4  HGB 11.2* 10.9*  HCT 33.4* 32.4*  MCV 90.0 89.3  PLT 70* 64*   Cardiac Enzymes: No results for input(s): CKTOTAL, CKMB, CKMBINDEX, TROPONINI in the last 168 hours. BNP (last 3 results) No results for input(s): BNP in the last 8760 hours.  ProBNP (last 3 results) No results for input(s): PROBNP in the last 8760 hours.  CBG: No results for input(s): GLUCAP in the last 168 hours.  Recent Results (from the past 240 hour(s))  Culture, blood (Routine X 2) w Reflex to ID Panel     Status: None   Collection Time: 07/18/15 12:35 PM  Result Value Ref Range Status   Specimen Description BLOOD LEFT ANTECUBITAL  Final   Special Requests BOTTLES DRAWN AEROBIC AND ANAEROBIC 5CCS  Final   Culture NO GROWTH 5 DAYS  Final   Report Status 07/23/2015 FINAL  Final  Culture, blood (Routine X 2) w Reflex to ID Panel     Status: None   Collection Time: 07/18/15 12:56 PM  Result Value Ref Range Status   Specimen Description BLOOD RIGHT ANTECUBITAL  Final   Special Requests BOTTLES DRAWN AEROBIC AND ANAEROBIC 4CCS  Final   Culture NO GROWTH 5 DAYS  Final   Report Status 07/23/2015 FINAL  Final   Culture, Urine     Status: None   Collection Time: 07/18/15 11:50 PM  Result Value Ref Range Status   Specimen Description URINE, RANDOM  Final   Special Requests NONE  Final   Culture MULTIPLE SPECIES PRESENT, SUGGEST RECOLLECTION  Final   Report Status 07/20/2015 FINAL  Final     Studies: Dg Swallowing Func-speech Pathology  07/24/2015  Objective Swallowing Evaluation:   Patient Details Name: Jeff Foster MRN: [id number] Date of Birth: 1960-11-11 Today's Date: 07/24/2015 Time: SLP Start Time (ACUTE ONLY): 1422-SLP Stop Time (ACUTE ONLY): 1440 SLP Time Calculation (min) (ACUTE ONLY): 18 min Past Medical History: No past medical history on file. Past Surgical History: No past surgical history on file. HPI: Pt admitted with acute encephalopathy. 55 y.o. male with mental retardation from group home with apparent progressive decline in function and diet tolerance. CXR 1/13 concerning for PNA. Subjective: pt lethargic Assessment / Plan / Recommendation CHL IP CLINICAL IMPRESSIONS 07/24/2015 Therapy Diagnosis Severe oral phase dysphagia;Severe pharyngeal phase dysphagia Clinical Impression Pt has a severe oropharyngeal dysphagia with limited assessment. He has weak and ineffective lingual manipulation for containment of thin liquids, resulting in almost all liquids spilling anteriorly from his oral cavity. Puree boluses have poor cohesion, but with Max cues from SLP and assistance from RN, small amounts of puree are posteriorly transited to the pharynx. It takes Max cues to elicit a pharyngeal swallow response with puree sitting in the valleculae during significant delay. When he does ultimately swallow, he does not appear to have any appreciable transfer from the pharynx to the esophagus, with the entire bolus staying in the valleculae, base of tongue, and oral cavity. Delayed coughing not captured on camera is concerning for aspiration after the swallow on residue. Additional boluses were attempted after  completion of testing (please see treatment note for further details), but NPO status is recommended at this time due to severe aspiration risk. Impact on safety and function Severe aspiration risk;Risk for inadequate nutrition/hydration   CHL IP TREATMENT RECOMMENDATION 07/24/2015 Treatment Recommendations Therapy as outlined in treatment plan below   Prognosis 07/24/2015 Prognosis for Safe Diet Advancement Guarded Barriers to Reach Goals Other (Comment) Barriers/Prognosis Comment -- CHL IP DIET RECOMMENDATION 07/24/2015 SLP Diet Recommendations NPO Liquid Administration via -- Medication Administration Via alternative means Compensations -- Postural Changes --   CHL IP OTHER RECOMMENDATIONS 07/24/2015 Recommended Consults -- Oral Care Recommendations Oral care QID Other Recommendations --   CHL IP FOLLOW UP RECOMMENDATIONS 07/24/2015 Follow up Recommendations (No Data)   CHL IP FREQUENCY AND DURATION 07/24/2015 Speech Therapy Frequency (ACUTE ONLY) min 2x/week Treatment Duration 2 weeks      CHL IP ORAL PHASE 07/24/2015 Oral Phase Impaired Oral - Pudding Teaspoon -- Oral - Pudding Cup -- Oral - Honey Teaspoon -- Oral - Honey Cup -- Oral - Nectar Teaspoon -- Oral - Nectar Cup -- Oral - Nectar Straw -- Oral - Thin Teaspoon Weak lingual manipulation;Reduced posterior propulsion;Left anterior bolus loss;Right anterior bolus  loss;Decreased bolus cohesion;Lingual/palatal residue Oral - Thin Cup -- Oral - Thin Straw -- Oral - Puree Weak lingual manipulation;Reduced posterior propulsion;Left anterior bolus loss;Right anterior bolus loss;Decreased bolus cohesion;Lingual/palatal residue;Delayed oral transit Oral - Mech Soft -- Oral - Regular -- Oral - Multi-Consistency -- Oral - Pill -- Oral Phase - Comment --  CHL IP PHARYNGEAL PHASE 07/24/2015 Pharyngeal Phase Impaired Pharyngeal- Pudding Teaspoon -- Pharyngeal -- Pharyngeal- Pudding Cup -- Pharyngeal -- Pharyngeal- Honey Teaspoon -- Pharyngeal -- Pharyngeal- Honey Cup --  Pharyngeal -- Pharyngeal- Nectar Teaspoon -- Pharyngeal -- Pharyngeal- Nectar Cup -- Pharyngeal -- Pharyngeal- Nectar Straw -- Pharyngeal -- Pharyngeal- Thin Teaspoon NT Pharyngeal -- Pharyngeal- Thin Cup -- Pharyngeal -- Pharyngeal- Thin Straw -- Pharyngeal -- Pharyngeal- Puree Delayed swallow initiation-vallecula;Reduced pharyngeal peristalsis;Reduced epiglottic inversion;Reduced anterior laryngeal mobility;Reduced laryngeal elevation;Reduced airway/laryngeal closure;Reduced tongue base retraction;Pharyngeal residue - valleculae Pharyngeal -- Pharyngeal- Mechanical Soft -- Pharyngeal -- Pharyngeal- Regular -- Pharyngeal -- Pharyngeal- Multi-consistency -- Pharyngeal -- Pharyngeal- Pill -- Pharyngeal -- Pharyngeal Comment --  CHL IP CERVICAL ESOPHAGEAL PHASE 07/24/2015 Cervical Esophageal Phase (No Data) Pudding Teaspoon -- Pudding Cup -- Honey Teaspoon -- Honey Cup -- Nectar Teaspoon -- Nectar Cup -- Nectar Straw -- Thin Teaspoon -- Thin Cup -- Thin Straw -- Puree -- Mechanical Soft -- Regular -- Multi-consistency -- Pill -- Cervical Esophageal Comment -- No flowsheet data found. Maxcine Ham, M.A. CCC-SLP [phone] Maxcine Ham 07/24/2015, 3:53 PM               Scheduled Meds: . antiseptic oral rinse  7 mL Mouth Rinse q12n4p  . aspirin EC  81 mg Oral Daily  . atorvastatin  80 mg Oral Daily  . chlorhexidine  15 mL Mouth Rinse BID  . clonazePAM  0.5 mg Oral BID  . divalproex  1,500 mg Oral QHS  . enoxaparin (LOVENOX) injection  40 mg Subcutaneous Q24H  . famotidine  10 mg Oral Once  . levothyroxine  25 mcg Oral QAC breakfast  . OXcarbazepine  150 mg Oral BID  . pantoprazole  40 mg Oral Daily  . risperiDONE  1 mg Oral QHS  . scopolamine  1 patch Transdermal Q72H  . tamsulosin  0.4 mg Oral QHS   Continuous Infusions: . sodium chloride 75 mL/hr at 07/26/15 0532   Antibiotics Given (last 72 hours)    None      Principal Problem:   Acute encephalopathy Active Problems:   UTI  (lower urinary tract infection)   Cognitive decline   Mental retardation   Hypertension   History of CVA (cerebrovascular accident)   Encephalopathy acute   DNR (do not resuscitate)   Aspiration pneumonia (HCC)   Palliative care encounter   Dysphagia   Altered mental status   Time spent:  [facility] A  [facility]  [email or web address], password [facility] 07/26/2015, 1:43 PM  LOS: 7 days

## 2015-07-27 NOTE — Progress Notes (Signed)
[facility] PROGRESS NOTE  Jeff Foster NFA:[id number] DOB: Oct 19, 1960 DOA: 07/18/2015 PCP: No primary care provider on file.   HPI/Subjective: Await group home evaluation to return over there. No changes clinically.Still on restraints.  Brief narrative:                                                                                                                                                                                                                                                                                                                                        This is a 55 year old male with past history of moderate mental retardation and significant psychiatric history of intermittent Explosive disorder,  Paranoia, Schizophrnia, h/o CVA, COPD,  just recently discharged from hospital in [place] to Group Home in [place] around Christmas (12/24 or 12/16). At the group home pt was confused, somnolent,  total care at group home per staff, requiring 2plus assistance , unable to carry a conversation, per [name] at group home he is mostly confused, aggressive, and rarely appropriate. He was brought to [facility] 1/13 due to worsening lethargy, behavioral issues. CT head unremarkable, UA was abnormal, has been on Abx, due to increased lethargy i cut down doses of Thorazine and Depakote, since last evening waking up some, and very aggressive behavior, hitting staff. -Urine Cx and EEG negative   Assessment/Plan:  Encephalopathy - possibly secondary to UTI, meds, very poor baseline too -now awake, but very aggressive, confused  On PRN Haldol, got Iv ativan PRN x1 last night per staff which helped him - Head CT scan negative for acute findings. Normal ammonia.  - EEG negative for seizures, Urine Cx polymicorbial, Stop Rocephin  - Blood and uirne cx negative,  - Baseline mentation quite poor for 3 weeks, total care at group home per staff, 2plus assistance , unable to carry a  conversation, per [name] at group home he is mostly confused,  aggressive, and rarely appropriate -2days ago i cut down Depakote dose, stopped thorazine dose,  due to concern for over medication - SLp eval and diet when mentation improves - Per Staff at Group home he came with diagnosis of Moderate MR, intermittent Explosive disorder,  Paranoia, Schizophrnia, some signs of Dementia -Depakote to  QHS, add Risperidone, and Klonopin 0.5mg  BID. -Psychiatry adjusted medications.  Dysphagia:  -Appreciate relative consultation, appears to be chronic dysphagia, family wants him to have thinly chopped food. -Has had a stroke in March 2015.  Mental retardation.    Sinus tachycardia, resolved. Dry mucous membranes, suspect dehyration. He is s/p 2 liters bolus in ED -Continue IVF  Hypertension. Controlled  Transaminitis. No baseline LFTs to compare. Possibly secondary to medications.  -no abd symptoms, cmet in am  Hypothyroidism.  -continue home synthroid. -TSH WNL  GERD. -continue daily PPI  History of CVA per records brought in by Group Home.   Hypokalemia Replete with oral supplements, check BMP in a.m.  Thrombocytopenia Platelets 64, was 96 on admission, continue to follow closely.   Palliative care consulted, DNR/DNI and no artificial feeding, allowed to eat  CONSULTANTS: psychiatry, palliative  Code Status: Full code DVT Prophylaxis: Lovenox Family Communication:  Disposition Plan: await palliative eval     Objective: Filed Vitals:   07/26/15 2101 07/27/15 0525  BP: 127/81 119/79  Pulse: 75 65  Temp: 98.7 F (37.1 C) 97.6 F (36.4 C)  Resp: 18 18    Intake/Output Summary (Last 24 hours) at 07/27/15 1354 Last data filed at 07/27/15 1207  Gross per 24 hour  Intake   1551 ml  Output   3500 ml  Net  -1949 ml   Filed Weights   07/27/15 0525  Weight: 86.274 kg (190 lb 3.2 oz)    Exam:   General:  Alert. Intermittently agitated.  Posey belt and mittens  Cardiovascular: S1S2/RRR  Respiratory: CTAB  Abdomen: soft, NT, BS present  Musculoskeletal: no edema c/c  Data Reviewed: Basic Metabolic Panel:  Recent Labs Lab 07/23/15 0555 07/25/15 0522  NA 142 144  K 3.8 3.3*  CL 107 111  CO2 26 24  GLUCOSE 95 94  BUN 7 8  CREATININE 0.59* 0.60*  CALCIUM 8.7* 8.5*   Liver Function Tests:  Recent Labs Lab 07/25/15 0522  AST 63*  ALT 56  ALKPHOS 48  BILITOT 0.6  PROT 6.0*  ALBUMIN 2.5*   No results for input(s): LIPASE, AMYLASE in the last 168 hours. No results for input(s): AMMONIA in the last 168 hours. CBC:  Recent Labs Lab 07/23/15 0550  WBC 9.4  HGB 10.9*  HCT 32.4*  MCV 89.3  PLT 64*   Cardiac Enzymes: No results for input(s): CKTOTAL, CKMB, CKMBINDEX, TROPONINI in the last 168 hours. BNP (last 3 results) No results for input(s): BNP in the last 8760 hours.  ProBNP (last 3 results) No results for input(s): PROBNP in the last 8760 hours.  CBG: No results for input(s): GLUCAP in the last 168 hours.  Recent Results (from the past 240 hour(s))  Culture, blood (Routine X 2) w Reflex to ID Panel     Status: None   Collection Time: 07/18/15 12:35 PM  Result Value Ref Range Status   Specimen Description BLOOD LEFT ANTECUBITAL  Final   Special Requests BOTTLES DRAWN AEROBIC AND ANAEROBIC 5CCS  Final   Culture NO GROWTH 5 DAYS  Final   Report Status 07/23/2015 FINAL  Final  Culture, blood (Routine X 2) w  Reflex to ID Panel     Status: None   Collection Time: 07/18/15 12:56 PM  Result Value Ref Range Status   Specimen Description BLOOD RIGHT ANTECUBITAL  Final   Special Requests BOTTLES DRAWN AEROBIC AND ANAEROBIC 4CCS  Final   Culture NO GROWTH 5 DAYS  Final   Report Status 07/23/2015 FINAL  Final  Culture, Urine     Status: None   Collection Time: 07/18/15 11:50 PM  Result Value Ref Range Status   Specimen Description URINE, RANDOM  Final   Special Requests NONE  Final   Culture  MULTIPLE SPECIES PRESENT, SUGGEST RECOLLECTION  Final   Report Status 07/20/2015 FINAL  Final     Studies: No results found.  Scheduled Meds: . antiseptic oral rinse  7 mL Mouth Rinse q12n4p  . aspirin EC  81 mg Oral Daily  . atorvastatin  80 mg Oral Daily  . chlorhexidine  15 mL Mouth Rinse BID  . clonazePAM  0.5 mg Oral BID  . divalproex  1,500 mg Oral QHS  . enoxaparin (LOVENOX) injection  40 mg Subcutaneous Q24H  . famotidine  10 mg Oral Once  . levothyroxine  25 mcg Oral QAC breakfast  . OXcarbazepine  150 mg Oral BID  . pantoprazole  40 mg Oral Daily  . risperiDONE  1 mg Oral QHS  . scopolamine  1 patch Transdermal Q72H  . tamsulosin  0.4 mg Oral QHS   Continuous Infusions: . sodium chloride 75 mL/hr at 07/27/15 0423   Antibiotics Given (last 72 hours)    None      Principal Problem:   Acute encephalopathy Active Problems:   UTI (lower urinary tract infection)   Cognitive decline   Mental retardation   Hypertension   History of CVA (cerebrovascular accident)   Encephalopathy acute   DNR (do not resuscitate)   Aspiration pneumonia (HCC)   Palliative care encounter   Dysphagia   Altered mental status   Time spent:  [facility] A  [facility]  [email or web address], password [facility] 07/27/2015, 1:54 PM  LOS: 8 days

## 2015-07-27 NOTE — Progress Notes (Signed)
  im geodon administered for agitation

## 2015-07-28 DIAGNOSIS — Z515 Encounter for palliative care: Secondary | ICD-10-CM

## 2015-07-28 DIAGNOSIS — F209 Schizophrenia, unspecified: Secondary | ICD-10-CM

## 2015-07-28 MED ORDER — RISPERIDONE 1 MG PO TABS
1.0000 mg | ORAL_TABLET | Freq: Two times a day (BID) | ORAL | Status: DC
  Administered 2015-07-28 – 2015-08-07 (×19): 1 mg via ORAL
  Filled 2015-07-28 (×24): qty 1

## 2015-07-28 MED ORDER — OXCARBAZEPINE 300 MG PO TABS
300.0000 mg | ORAL_TABLET | Freq: Two times a day (BID) | ORAL | Status: DC
  Administered 2015-07-28 – 2015-08-07 (×20): 300 mg via ORAL
  Filled 2015-07-28 (×22): qty 1
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
## 2015-07-28 NOTE — Progress Notes (Signed)
SLP Cancellation Note  Patient Details Name: Jeff Foster MRN: [id number] DOB: 08-27-1960   Cancelled treatment:       Reason Eval/Treat Not Completed: Other (comment) Per palliative care, pt has a h/o of known aspiration and associated PNAs. The decision was previously made to allow him to eat/drink, accepting the risks that come along with this. He has been restarted on his baseline diet - SLP to sign off. Please re-order if we can be of any additional assistance.   Maxcine Ham, M.A. CCC-SLP [phone]  Maxcine Ham 07/28/2015, 3:42 PM
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
## 2015-07-28 NOTE — Clinical Documentation Improvement (Signed)
Hospitalist  (Query responses must be documented in the current medical record, not in the CDI BPA Form.)  "Aspiration Pneumonia" was documented for the first time in the progress note dated 07/27/15 by Dr. Arthor Captain.  Please confirm if the patient has had Aspiration Pneumonia this admission, including whether or not the condition was present on admission.  Patient was treated with IV Rocephin this admission until 07/22/15 for suspected UTI per documentation.   Please exercise your independent, professional judgment when responding. A specific answer is not anticipated or expected.   Thank You, Jerral Ralph  RN BSN CCDS [phone] Health Information Management [place]

## 2015-07-29 MED ORDER — POTASSIUM CHLORIDE 2 MEQ/ML IV SOLN
INTRAVENOUS | Status: DC
  Administered 2015-07-29 – 2015-08-01 (×5): via INTRAVENOUS
  Filled 2015-07-29 (×19): qty 1000

## 2015-07-29 NOTE — Progress Notes (Signed)
[facility] PROGRESS NOTE  Jeff Foster LKG:[id number] DOB: 1961/02/26 DOA: 07/18/2015 PCP: No primary care provider on file.   HPI/Subjective: No changes clinically. Patient is still in restraints, Posey belt and mittens.  Brief narrative:                                                                                                                                                                                                                                                                                                                                        This is a 55 year old male with past history of moderate mental retardation and significant psychiatric history of intermittent Explosive disorder,  Paranoia, Schizophrnia, h/o CVA, COPD,  just recently discharged from hospital in [place] to Group Home in [place] around Christmas (12/24 or 12/16). At the group home pt was confused, somnolent,  total care at group home per staff, requiring 2plus assistance , unable to carry a conversation, per [name] at group home he is mostly confused, aggressive, and rarely appropriate. He was brought to [facility] 1/13 due to worsening lethargy, behavioral issues. CT head unremarkable, UA was abnormal, has been on Abx, due to increased lethargy i cut down doses of Thorazine and Depakote, since last evening waking up some, and very aggressive behavior, hitting staff. -Urine Cx and EEG negative   Assessment/Plan:  Encephalopathy - possibly secondary to UTI, meds, very poor baseline too -now awake, but very aggressive, confused  On PRN Haldol, got Iv ativan PRN x1 last night per staff which helped him - Head CT scan negative for acute findings. Normal ammonia.  - EEG negative for seizures, Urine Cx polymicorbial, Stop Rocephin  - Blood and uirne cx negative,  - Baseline mentation quite poor for 3 weeks, total care at group home per staff, 2plus assistance , unable to carry a conversation,  per [name] at group home he is mostly confused, aggressive,  and rarely appropriate -2days ago i cut down Depakote dose, stopped thorazine dose,  due to concern for over medication - SLp eval and diet when mentation improves - Per Staff at Group home he came with diagnosis of Moderate MR, intermittent Explosive disorder,  Paranoia, Schizophrnia, some signs of Dementia -Depakote to  QHS, add Risperidone, and Klonopin 0.5mg  BID. -Psychiatry re-consulted, has real aggressive and disruptive behavior today  Dysphagia:  -Appreciate relative consultation, appears to be chronic dysphagia, family wants him to have thinly chopped food. -Has had a stroke in March 2015.  Mental retardation.    Sinus tachycardia, resolved. Dry mucous membranes, suspect dehyration. He is s/p 2 liters bolus in ED -Continue IVF  Hypertension. Controlled  Transaminitis. No baseline LFTs to compare. Possibly secondary to medications.  -no abd symptoms, cmet in am  Hypothyroidism.  -continue home synthroid. -TSH WNL  GERD. -continue daily PPI  History of CVA per records brought in by Group Home.   Hypokalemia Replete with oral supplements, check BMP in a.m.  Thrombocytopenia Platelets 64, was 96 on admission, continue to follow closely.   Palliative care consulted, DNR/DNI and no artificial feeding, allowed to eat  CONSULTANTS: psychiatry, palliative  Code Status: Full code DVT Prophylaxis: Lovenox Family Communication:  Disposition Plan:      Objective: Filed Vitals:   07/27/15 2247 07/28/15 1358  BP: 125/78 130/75  Pulse: 80 83  Temp:  98.3 F (36.8 C)  Resp: 19 19    Intake/Output Summary (Last 24 hours) at 07/29/15 1317 Last data filed at 07/29/15 1259  Gross per 24 hour  Intake 2478.75 ml  Output   2575 ml  Net -96.25 ml   Filed Weights   07/27/15 0525 07/29/15 0500  Weight: 86.274 kg (190 lb 3.2 oz) 86.3 kg (190 lb 4.1 oz)    Exam:   General:  Alert.  Intermittently agitated. Posey belt and mittens  Cardiovascular: S1S2/RRR  Respiratory: CTAB  Abdomen: soft, NT, BS present  Musculoskeletal: no edema c/c  Data Reviewed: Basic Metabolic Panel:  Recent Labs Lab 07/23/15 0555 07/25/15 0522  NA 142 144  K 3.8 3.3*  CL 107 111  CO2 26 24  GLUCOSE 95 94  BUN 7 8  CREATININE 0.59* 0.60*  CALCIUM 8.7* 8.5*   Liver Function Tests:  Recent Labs Lab 07/25/15 0522  AST 63*  ALT 56  ALKPHOS 48  BILITOT 0.6  PROT 6.0*  ALBUMIN 2.5*   No results for input(s): LIPASE, AMYLASE in the last 168 hours. No results for input(s): AMMONIA in the last 168 hours. CBC:  Recent Labs Lab 07/23/15 0550  WBC 9.4  HGB 10.9*  HCT 32.4*  MCV 89.3  PLT 64*   Cardiac Enzymes: No results for input(s): CKTOTAL, CKMB, CKMBINDEX, TROPONINI in the last 168 hours. BNP (last 3 results) No results for input(s): BNP in the last 8760 hours.  ProBNP (last 3 results) No results for input(s): PROBNP in the last 8760 hours.  CBG: No results for input(s): GLUCAP in the last 168 hours.  No results found for this or any previous visit (from the past 240 hour(s)).   Studies: No results found.  Scheduled Meds: . antiseptic oral rinse  7 mL Mouth Rinse q12n4p  . aspirin EC  81 mg Oral Daily  . atorvastatin  80 mg Oral Daily  . chlorhexidine  15 mL Mouth Rinse BID  . clonazePAM  0.5 mg Oral BID  . divalproex  1,500 mg Oral QHS  .  enoxaparin (LOVENOX) injection  40 mg Subcutaneous Q24H  . famotidine  10 mg Oral Once  . levothyroxine  25 mcg Oral QAC breakfast  . OXcarbazepine  300 mg Oral BID  . pantoprazole  40 mg Oral Daily  . risperiDONE  1 mg Oral BID  . scopolamine  1 patch Transdermal Q72H  . tamsulosin  0.4 mg Oral QHS   Continuous Infusions: . sodium chloride 75 mL/hr at 07/29/15 4540   Antibiotics Given (last 72 hours)    None      Principal Problem:   Acute encephalopathy Active Problems:   UTI (lower urinary tract  infection)   Cognitive decline   Mental retardation   Hypertension   History of CVA (cerebrovascular accident)   Encephalopathy acute   DNR (do not resuscitate)   Aspiration pneumonia (HCC)   Palliative care encounter   Dysphagia   Altered mental status   Time spent:  [facility]  [facility]  [email or web address], password [facility] 07/29/2015, 1:17 PM  LOS: 10 days

## 2015-07-29 NOTE — Progress Notes (Signed)
PT Cancellation Note  Patient Details Name: Jeff Foster MRN: [id number] DOB: 01-23-1961   Cancelled Treatment:    Reason Eval/Treat Not Completed: Other (comment) (deferred by nurse) Pt verbally agitated. Grunting and calling out heard from the hallway. RN deferred PT today as pt is very agitated, unable to follow commands and in restraints. Will follow up with PT as able.   Marcene Brawn 07/29/2015, 11:25 AM   Lewis Shock, PT, DPT Pager #: [phone] Office #: [phone]
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
## 2015-07-30 NOTE — Progress Notes (Signed)
Physical Therapy Treatment Patient Details Name: Jeff Foster MRN: [id number] DOB: April 01, 1961 Today's Date: 07/30/2015    History of Present Illness Pt admitted with acute encephalopathy. 55 y.o. male with mental retardation from group home with apparent progressive decline in function and diet tolerance    PT Comments    Pt calm this morning and participated very well with therapy, ambulating 30' with mod A +2, first HHA then pushing IV pole, can try RW next time. PT will continue to follow.   Follow Up Recommendations  SNF;Supervision/Assistance - 24 hour     Equipment Recommendations  Rolling walker with 5" wheels;3in1 (PT)    Recommendations for Other Services       Precautions / Restrictions Precautions Precautions: Fall Restrictions Weight Bearing Restrictions: No    Mobility  Bed Mobility Overal bed mobility: Needs Assistance Bed Mobility: Supine to Sit     Supine to sit: Mod assist     General bed mobility comments: tactile cues to initiate motion to EOB, min A for moving legs and mod A for trunk elevation to sitting and scooting to EOB  Transfers Overall transfer level: Needs assistance Equipment used: 2 person hand held assist Transfers: Sit to/from Stand Sit to Stand: Mod assist;+2 physical assistance         General transfer comment: pt attempting to stand on his own before lines ready, cues for waiting, mod A +2 for power up and to steady. No knee buckling this day  Ambulation/Gait Ambulation/Gait assistance: Mod assist;+2 physical assistance Ambulation Distance (Feet): 30 Feet Assistive device: 2 person hand held assist Gait Pattern/deviations: Step-through pattern;Trunk flexed;Staggering left;Staggering right Gait velocity: decreased Gait velocity interpretation: <1.8 ft/sec, indicative of risk for recurrent falls General Gait Details: began ambulation with 2 person HHA, after 15', let pt push IV pole and mod A behind, +1 for safety to side of pt  and helping maneuver pole. Pt unsteady with ambulation but no knee buckling. Cues for turning and safety   Stairs            Wheelchair Mobility    Modified Rankin (Stroke Patients Only)       Balance Overall balance assessment: Needs assistance Sitting-balance support: No upper extremity supported;Feet supported Sitting balance-Leahy Scale: Fair Sitting balance - Comments: pt initally with anterior lean but once corrected able to maintain balance EOB as long as not challenged   Standing balance support: Bilateral upper extremity supported Standing balance-Leahy Scale: Poor Standing balance comment: requires bilateral UE support                    Cognition Arousal/Alertness: Awake/alert Behavior During Therapy: Flat affect Overall Cognitive Status: No family/caregiver present to determine baseline cognitive functioning                      Exercises General Exercises - Lower Extremity Ankle Circles/Pumps: AROM;Both;10 reps;Supine    General Comments General comments (skin integrity, edema, etc.): pt restrained at wrists, posey, and mitts as well as chair alarm, ankle restraints not retied, RN aware. Pt wants to have drink, received permission from RN to remove one mitt so her could hold cup      Pertinent Vitals/Pain Pain Assessment: Faces Pain Score: 0-No pain Faces Pain Scale: No hurt    Home Living                      Prior Function  PT Goals (current goals can now be found in the care plan section) Acute Rehab PT Goals Patient Stated Goal: drink Coke PT Goal Formulation: Patient unable to participate in goal setting Time For Goal Achievement: 08/04/15 Potential to Achieve Goals: Fair Progress towards PT goals: Progressing toward goals    Frequency  Min 2X/week    PT Plan Current plan remains appropriate    Co-evaluation             End of Session Equipment Utilized During Treatment: Gait belt Activity  Tolerance: Patient tolerated treatment well Patient left: in chair;with call bell/phone within reach;with chair alarm set;with restraints reapplied     Time: 0850-0909 PT Time Calculation (min) (ACUTE ONLY): 19 min  Charges:  $Gait Training: 8-22 mins                    G Codes:     Lyanne Co, PT  Acute Rehab Services  [phone]  Lyanne Co 07/30/2015, 11:58 AM

## 2015-07-30 NOTE — Progress Notes (Signed)
[facility] PROGRESS NOTE  Berl Bonfanti ZOX:[id number] DOB: [birth year] DOA: 07/18/2015 PCP: No primary care provider on file.  Assessment/Plan:  Encephalopathy - possibly secondary to UTI, meds. -now awake, but very aggressive, confused On PRN Haldol, got Iv ativan PRN x1 last night per staff which helped him - Head CT scan negative for acute findings. Normal ammonia.  - EEG negative for seizures, Urine Cx polymicorbial, Stop Rocephin  - Blood and uirne cx negative,  - Baseline mentation quite poor for 3 weeks, total care at group home per staff, 2plus assistance , unable to carry a conversation, per [name] at group home he is mostly confused, aggressive, and rarely appropriate - Depakote dose was reduced, stopped thorazine dose, due to concern for over medication - SLp eval and diet when mentation improves - Per Staff at Group home he came with diagnosis of Moderate MR, intermittent Explosive disorder, Paranoia, Schizophrnia, some signs of Dementia -Depakote to  QHS, add Risperidone, and Klonopin 0.5mg  BID. -Psychiatry re-consulted, has real aggressive and disruptive behavior today  Dysphagia:  -Appreciate relative consultation, appears to be chronic dysphagia, family wants him to have thinly chopped food. -Has had a stroke in March 2015.   Sinus tachycardia, resolved. Dry mucous membranes, suspect dehyration. He is s/p 2 liters bolus in ED -Continue IVF  Hypertension. Controlled  Transaminitis. No baseline LFTs to compare. Possibly secondary to medications.  -no abd symptoms, cmet in am  Hypothyroidism.  -continue home synthroid. -TSH WNL  GERD. -continue daily PPI  History of CVA per records brought in by Group Home.   Hypokalemia Replete with oral supplements, check BMP in a.m.  Thrombocytopenia Platelets 64, was 96 on admission, continue to follow closely.  Palliative care was consulted- recommendations patient is DO NOT RESUSCITATE/DO  NOT INTUBATE and no artificial feeding. Finely chopped moist diet with aspiration precautions  Code Status: DNR Family Communication: no family at bedside Disposition Plan: Skilled nursing facility when bed available   Consultants:  Palliative care  Psychiatry  Procedures:  None  Antibiotics:  None  HPI/Subjective: 55 year old male with past history of moderate mental retardation and significant psychiatric history of intermittent Explosive disorder, Paranoia, Schizophrnia, h/o CVA, COPD, just recently discharged from hospital in [place] to Group Home in [place] around Christmas (12/24 or 12/16). At the group home pt was confused, somnolent, total care at group home per staff, requiring 2plus assistance , unable to carry a conversation, per [name] at group home he is mostly confused, aggressive, and rarely appropriate. He was brought to [facility] 1/13 due to worsening lethargy, behavioral issues. CT head unremarkable, UA was abnormal, has been on Abx, due to increased lethargy idoses of Thorazine and Depakote was reduced, since last evening waking up some, and very aggressive behavior, hitting staff. -Urine Cx and EEG negative  Patient continues to be in restraints.  Objective: Filed Vitals:   07/30/15 0541 07/30/15 1449  BP: 123/77 124/77  Pulse: 70 84  Temp: 98.3 F (36.8 C) 98.3 F (36.8 C)  Resp: 18 18    Intake/Output Summary (Last 24 hours) at 07/30/15 1608 Last data filed at 07/30/15 1600  Gross per 24 hour  Intake 2059.5 ml  Output      0 ml  Net 2059.5 ml   Filed Weights   07/27/15 0525 07/29/15 0500 07/30/15 0541  Weight: 86.274 kg (190 lb 3.2 oz) 86.3 kg (190 lb 4.1 oz) 85.684 kg (188 lb 14.4 oz)    Exam:   General:  Alert, in restraints with [facility] and mittens  Cardiovascular: S1-S2 regular  Respiratory: Clear to auscultation bilaterally  Abdomen: Soft, nontender, no organomegaly  Musculoskeletal: No  cyanosis/clubbing/edema. Extremities   Data Reviewed: Basic Metabolic Panel:  Recent Labs Lab 07/25/15 0522  NA 144  K 3.3*  CL 111  CO2 24  GLUCOSE 94  BUN 8  CREATININE 0.60*  CALCIUM 8.5*   Liver Function Tests:  Recent Labs Lab 07/25/15 0522  AST 63*  ALT 56  ALKPHOS 48  BILITOT 0.6  PROT 6.0*  ALBUMIN 2.5*     Studies: No results found.  Scheduled Meds: . antiseptic oral rinse  7 mL Mouth Rinse q12n4p  . aspirin EC  81 mg Oral Daily  . atorvastatin  80 mg Oral Daily  . chlorhexidine  15 mL Mouth Rinse BID  . clonazePAM  0.5 mg Oral BID  . divalproex  1,500 mg Oral QHS  . enoxaparin (LOVENOX) injection  40 mg Subcutaneous Q24H  . famotidine  10 mg Oral Once  . levothyroxine  25 mcg Oral QAC breakfast  . OXcarbazepine  300 mg Oral BID  . pantoprazole  40 mg Oral Daily  . risperiDONE  1 mg Oral BID  . scopolamine  1 patch Transdermal Q72H  . tamsulosin  0.4 mg Oral QHS   Continuous Infusions: . sodium chloride 0.9 % 1,000 mL with potassium chloride 40 mEq infusion 75 mL/hr at 07/30/15 1610    Principal Problem:   Acute encephalopathy Active Problems:   UTI (lower urinary tract infection)   Cognitive decline   Mental retardation   Hypertension   History of CVA (cerebrovascular accident)   Encephalopathy acute   DNR (do not resuscitate)   Aspiration pneumonia [facility])   Palliative care encounter   Dysphagia   Altered mental status    Time spent: 25 min    [facility]  [facility] Pager [phone]. If 7PM-7AM, please contact night-coverage at [email or web address], password [facility] 07/30/2015, 4:08 PM  LOS: 11 days
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
## 2015-07-31 LAB — BASIC METABOLIC PANEL
Anion gap: 7 (ref 5–15)
CHLORIDE: 109 mmol/L (ref 101–111)
CO2: 25 mmol/L (ref 22–32)
Calcium: 8.9 mg/dL (ref 8.9–10.3)
Creatinine, Ser: 0.58 mg/dL — ABNORMAL LOW (ref 0.61–1.24)
GFR calc non Af Amer: >60 mL/min (ref >60)
Glucose, Bld: 67 mg/dL (ref 65–99)
POTASSIUM: 3.7 mmol/L (ref 3.5–5.1)
SODIUM: 141 mmol/L (ref 135–145)

## 2015-07-31 LAB — CBC
HEMATOCRIT: 33.7 % — AB (ref 39.0–52.0)
Hemoglobin: 10.9 g/dL — ABNORMAL LOW (ref 13.0–17.0)
MCH: 30 pg (ref 26.0–34.0)
MCHC: 32.3 g/dL (ref 30.0–36.0)
MCV: 92.8 fL (ref 78.0–100.0)
Platelets: 185 10*3/uL (ref 150–400)
RBC: 3.63 MIL/uL — AB (ref 4.22–5.81)
RDW: 16.1 % — ABNORMAL HIGH (ref 11.5–15.5)
WBC: 11.7 10*3/uL — AB (ref 4.0–10.5)

## 2015-07-31 NOTE — Care Management Note (Signed)
Case Management Note  Patient Details  Name: Jeff Foster MRN: [id number] Date of Birth: 1960-10-19  Subjective/Objective:                    Action/Plan:  Discussed in length of stay meeting . UR updated  Expected Discharge Date:                  Expected Discharge Plan:  Skilled Nursing Facility  In-House Referral:  Clinical Social Work  Discharge planning Services     Post Acute Care Choice:    Choice offered to:     DME Arranged:    DME Agency:     HH Arranged:    HH Agency:     Status of Service:  In process, will continue to follow  Medicare Important Message Given:    Date Medicare IM Given:    Medicare IM give by:    Date Additional Medicare IM Given:    Additional Medicare Important Message give by:     If discussed at Long Length of Stay Meetings, dates discussed:    Additional Comments:  Kingsley Plan, RN 07/31/2015, 2:04 PM

## 2015-07-31 NOTE — Progress Notes (Signed)
[facility] PROGRESS NOTE  Ulric Salzman ZOX:[id number] DOB: Oct 11, 1960 DOA: 07/18/2015 PCP: No primary care provider on file.  Assessment/Plan:  Encephalopathy - possibly secondary to UTI, meds. -now awake, but very aggressive, confused On PRN Haldol, got Iv ativan PRN x1 last night per staff which helped him - Head CT scan negative for acute findings. Normal ammonia.  - EEG negative for seizures, Urine Cx polymicorbial, Stop Rocephin  - Blood and uirne cx negative,  - Baseline mentation quite poor for 3 weeks, total care at group home per staff, 2plus assistance , unable to carry a conversation, per [name] at group home he is mostly confused, aggressive, and rarely appropriate - Depakote dose was reduced, stopped thorazine dose, due to concern for over medication - SLp eval and diet when mentation improves - Per Staff at Group home he came with diagnosis of Moderate MR, intermittent Explosive disorder, Paranoia, Schizophrnia, some signs of Dementia -Depakote to  QHS, add Risperidone, and Klonopin 0.5mg  BID. -Psychiatry re-consulted, has real aggressive and disruptive behavior today  Dysphagia:  -Appreciate relative consultation, appears to be chronic dysphagia, family wants him to have thinly chopped food. -Has had a stroke in March 2015.   Sinus tachycardia, resolved. Dry mucous membranes, suspect dehyration. He is s/p 2 liters bolus in ED -Continue IVF  Hypertension. Controlled  Transaminitis. No baseline LFTs to compare. Possibly secondary to medications.  -no abd symptoms, cmet in am  Hypothyroidism.  -continue home synthroid. -TSH WNL  GERD. -continue daily PPI  History of CVA per records brought in by Group Home.   Hypokalemia Replete with oral supplements, check BMP in a.m.  Thrombocytopenia, resolved Platelets are back to 185 Platelets 64, was 96 on admission  Palliative care was consulted- recommendations patient is DO NOT  RESUSCITATE/DO NOT INTUBATE and no artificial feeding. Finely chopped moist diet with aspiration precautions  Code Status: DNR Family Communication: no family at bedside Disposition Plan: Skilled nursing facility when bed available   Consultants:  Palliative care  Psychiatry  Procedures:  None  Antibiotics:  None  HPI/Subjective: 55 year old male with past history of moderate mental retardation and significant psychiatric history of intermittent Explosive disorder, Paranoia, Schizophrnia, h/o CVA, COPD, just recently discharged from hospital in [place] to Group Home in [place] around Christmas (12/24 or 12/16). At the group home pt was confused, somnolent, total care at group home per staff, requiring 2plus assistance , unable to carry a conversation, per [name] at group home he is mostly confused, aggressive, and rarely appropriate. He was brought to [facility] 1/13 due to worsening lethargy, behavioral issues. CT head unremarkable, UA was abnormal, has been on Abx, due to increased lethargy idoses of Thorazine and Depakote was reduced, since last evening waking up some, and very aggressive behavior, hitting staff. -Urine Cx and EEG negative  Patient continues to be in restraints. Denies any complaints.  Objective: Filed Vitals:   07/31/15 0630 07/31/15 1439  BP: 134/74 126/76  Pulse: 76 82  Temp: 98.6 F (37 C) 98.6 F (37 C)  Resp: 18 18    Intake/Output Summary (Last 24 hours) at 07/31/15 1512 Last data filed at 07/31/15 1450  Gross per 24 hour  Intake   2050 ml  Output   3500 ml  Net  -1450 ml   Filed Weights   07/29/15 0500 07/30/15 0541 07/31/15 0300  Weight: 86.3 kg (190 lb 4.1 oz) 85.684 kg (188 lb 14.4 oz) 89.359 kg (197 lb)    Exam:  General:  Alert, in restraints with [name] and mittens  Cardiovascular: S1-S2 regular  Respiratory: Clear to auscultation bilaterally  Abdomen: Soft, nontender, no organomegaly  Musculoskeletal:  No cyanosis/clubbing/edema. Extremities   Data Reviewed: Basic Metabolic Panel:  Recent Labs Lab 07/25/15 0522 07/31/15 0830  NA 144 141  K 3.3* 3.7  CL 111 109  CO2 24 25  GLUCOSE 94 67  BUN 8 <5*  CREATININE 0.60* 0.58*  CALCIUM 8.5* 8.9   Liver Function Tests:  Recent Labs Lab 07/25/15 0522  AST 63*  ALT 56  ALKPHOS 48  BILITOT 0.6  PROT 6.0*  ALBUMIN 2.5*     Studies: No results found.  Scheduled Meds: . antiseptic oral rinse  7 mL Mouth Rinse q12n4p  . aspirin EC  81 mg Oral Daily  . atorvastatin  80 mg Oral Daily  . chlorhexidine  15 mL Mouth Rinse BID  . clonazePAM  0.5 mg Oral BID  . divalproex  1,500 mg Oral QHS  . enoxaparin (LOVENOX) injection  40 mg Subcutaneous Q24H  . famotidine  10 mg Oral Once  . levothyroxine  25 mcg Oral QAC breakfast  . OXcarbazepine  300 mg Oral BID  . pantoprazole  40 mg Oral Daily  . risperiDONE  1 mg Oral BID  . scopolamine  1 patch Transdermal Q72H  . tamsulosin  0.4 mg Oral QHS   Continuous Infusions: . sodium chloride 0.9 % 1,000 mL with potassium chloride 40 mEq infusion 75 mL/hr at 07/30/15 1934    Principal Problem:   Acute encephalopathy Active Problems:   UTI (lower urinary tract infection)   Cognitive decline   Mental retardation   Hypertension   History of CVA (cerebrovascular accident)   Encephalopathy acute   DNR (do not resuscitate)   Aspiration pneumonia [facility])   Palliative care encounter   Dysphagia   Altered mental status    Time spent: 25 min    [facility] S  [facility] Pager [phone]. If 7PM-7AM, please contact night-coverage at [email or web address], password [facility] 07/31/2015, 3:12 PM  LOS: 12 days

## 2015-07-31 NOTE — Clinical Social Work Psych Note (Addendum)
10:33am- Psych CSW received return call from Elly Modena who reports patient's SSN is [id number].  This information will be updated on patient's registration record.  PASARR will be initiated.    10:06am- Psych CSW spoke with sister, Asher Muir to provide update.  Sister reports she cannot travel to see patient due to distance (lives in [place]) and sister has handicapped daughter with a dx of cancer given less than one year to live.  Sister is HCPOA and can be contacted via phone.  Psych CSW contacted Elly Modena [facility]) to request assistance in obtaining patient's SSN in order to facilitate placement.  Elly Modena was not available, message left.  CSW awaiting a return call.  Sister did not have SSN at this time.  Psych CSW contacted group home director, [name] in attempts to obtain SSN.  Ladene Artist states he will obtain SSN at his office and contact psych CSW.  Psych CSW awaiting on a return call.  Vickii Penna, LCSW [phone]  [id number]; 2S 15-16 and Hospital Psychiatric Service Line Licensed Clinical Social Worker

## 2015-08-01 NOTE — Progress Notes (Signed)
[facility] PROGRESS NOTE  Jeff Foster ZOX:[id number] DOB: 06-Aug-1960 DOA: 07/18/2015 PCP: No primary care provider on file.  Assessment/Plan:  Encephalopathy - possibly secondary to UTI, meds. -now awake, but very aggressive, confused On PRN Haldol, got Iv ativan PRN x1 last night per staff which helped him - Head CT scan negative for acute findings. Normal ammonia.  - EEG negative for seizures, Urine Cx polymicorbial, Stop Rocephin  - Blood and uirne cx negative,  - Baseline mentation quite poor for 3 weeks, total care at group home per staff, 2plus assistance , unable to carry a conversation, per [name] at group home he is mostly confused, aggressive, and rarely appropriate - Depakote dose was reduced, stopped thorazine dose, due to concern for over medication - SLp eval and diet when mentation improves - Per Staff at Group home he came with diagnosis of Moderate MR, intermittent Explosive disorder, Paranoia, Schizophrnia, some signs of Dementia -Depakote to  QHS, add Risperidone, and Klonopin 0.5mg  BID. -Psychiatry re-consulted  Dysphagia:  -Appreciate relative consultation, appears to be chronic dysphagia, family wants him to have thinly chopped food. -Has had a stroke in March 2015.   Sinus tachycardia, resolved. Dry mucous membranes, suspect dehyration. He is s/p 2 liters bolus in ED -Continue IVF  Hypertension. Controlled  Transaminitis. No baseline LFTs to compare. Possibly secondary to medications.  -no abd symptoms, cmet in am  Hypothyroidism.  -continue home synthroid. -TSH WNL  GERD. -continue daily PPI  History of CVA per records brought in by Group Home.   Hypokalemia Replete with oral supplements, check BMP in a.m.  Thrombocytopenia, resolved Platelets are back to 185 Platelets was 96 on admission  Palliative care was consulted- recommendations patient is DO NOT RESUSCITATE/DO NOT INTUBATE and no artificial feeding. Finely  chopped moist diet with aspiration precautions  DVT prophylaxis Lovenox  Code Status: DNR Family Communication: no family at bedside Disposition Plan: Skilled nursing facility when bed available   Consultants:  Palliative care  Psychiatry  Procedures:  None  Antibiotics:  None  HPI/Subjective: 55 year old male with past history of moderate mental retardation and significant psychiatric history of intermittent Explosive disorder, Paranoia, Schizophrnia, h/o CVA, COPD, just recently discharged from hospital in [place] to Group Home in [place] around Christmas (12/24 or 12/16). At the group home pt was confused, somnolent, total care at group home per staff, requiring 2plus assistance , unable to carry a conversation, per [name] at group home he is mostly confused, aggressive, and rarely appropriate. He was brought to [facility] 1/13 due to worsening lethargy, behavioral issues. CT head unremarkable, UA was abnormal, has been on Abx, due to increased lethargy idoses of Thorazine and Depakote was reduced, since last evening waking up some, and very aggressive behavior, hitting staff. -Urine Cx and EEG negative  Patient feels better today, denies any complaints.  Objective: Filed Vitals:   07/31/15 2024 08/01/15 0623  BP: 101/73 110/70  Pulse: 69 76  Temp: 98.2 F (36.8 C) 99 F (37.2 C)  Resp: 18     Intake/Output Summary (Last 24 hours) at 08/01/15 1548 Last data filed at 08/01/15 1400  Gross per 24 hour  Intake   2080 ml  Output   1500 ml  Net    580 ml   Filed Weights   07/30/15 0541 07/31/15 0300 08/01/15 0623  Weight: 85.684 kg (188 lb 14.4 oz) 89.359 kg (197 lb) 87.68 kg (193 lb 4.8 oz)    Exam:   General:  Alert,  in restraints with [facility] and mittens  Cardiovascular: S1-S2 regular  Respiratory: Clear to auscultation bilaterally  Abdomen: Soft, nontender, no organomegaly  Musculoskeletal: No cyanosis/clubbing/edema. Extremities    Data Reviewed: Basic Metabolic Panel:  Recent Labs Lab 07/31/15 0830  NA 141  K 3.7  CL 109  CO2 25  GLUCOSE 67  BUN <5*  CREATININE 0.58*  CALCIUM 8.9   Liver Function Tests: No results for input(s): AST, ALT, ALKPHOS, BILITOT, PROT, ALBUMIN in the last 168 hours.   Studies: No results found.  Scheduled Meds: . antiseptic oral rinse  7 mL Mouth Rinse q12n4p  . aspirin EC  81 mg Oral Daily  . atorvastatin  80 mg Oral Daily  . chlorhexidine  15 mL Mouth Rinse BID  . clonazePAM  0.5 mg Oral BID  . divalproex  1,500 mg Oral QHS  . enoxaparin (LOVENOX) injection  40 mg Subcutaneous Q24H  . famotidine  10 mg Oral Once  . levothyroxine  25 mcg Oral QAC breakfast  . OXcarbazepine  300 mg Oral BID  . pantoprazole  40 mg Oral Daily  . risperiDONE  1 mg Oral BID  . scopolamine  1 patch Transdermal Q72H  . tamsulosin  0.4 mg Oral QHS   Continuous Infusions: . sodium chloride 0.9 % 1,000 mL with potassium chloride 40 mEq infusion 75 mL/hr at 08/01/15 1144    Principal Problem:   Acute encephalopathy Active Problems:   UTI (lower urinary tract infection)   Cognitive decline   Mental retardation   Hypertension   History of CVA (cerebrovascular accident)   Encephalopathy acute   DNR (do not resuscitate)   Aspiration pneumonia [facility])   Palliative care encounter   Dysphagia   Altered mental status    Time spent: 25 min    [facility] S  [facility] Pager [phone]. If 7PM-7AM, please contact night-coverage at [email or web address], password [facility] 08/01/2015, 3:48 PM  LOS: 13 days
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
## 2015-08-01 NOTE — Progress Notes (Signed)
Physical Therapy Treatment Patient Details Name: Jeff Foster MRN: [id number] DOB: 05/06/1961 Today's Date: 2015-08-23    History of Present Illness Pt admitted with acute encephalopathy. 55 y.o. male with mental retardation from group home with apparent progressive decline in function and diet tolerance    PT Comments    Patient making slow gains with mobility and gait.  Follow Up Recommendations  SNF;Supervision/Assistance - 24 hour     Equipment Recommendations  Rolling walker with 5" wheels;3in1 (PT)    Recommendations for Other Services       Precautions / Restrictions Precautions Precautions: Fall Restrictions Weight Bearing Restrictions: No    Mobility  Bed Mobility Overal bed mobility: Needs Assistance Bed Mobility: Supine to Sit     Supine to sit: Min assist     General bed mobility comments: Min assist for safety and to elevate trunk to sitting position.  Transfers Overall transfer level: Needs assistance Equipment used: 2 person hand held assist Transfers: Sit to/from Stand Sit to Stand: Mod assist;+2 physical assistance         General transfer comment: Assist to power up to standing.  Patient with very flexed posture.  Cues to stand upright.  Ambulation/Gait Ambulation/Gait assistance: Mod assist;+2 physical assistance Ambulation Distance (Feet): 20 Feet Assistive device: 2 person hand held assist Gait Pattern/deviations: Step-through pattern;Decreased step length - right;Decreased step length - left;Decreased stride length;Shuffle;Trunk flexed Gait velocity: decreased Gait velocity interpretation: Below normal speed for age/gender General Gait Details: Cues to stand upright.  Patient with very flexed posture.  Patient shuffling feet with short step lengths.  Required +2 mod assist for balance to prevent fall.   Stairs            Wheelchair Mobility    Modified Rankin (Stroke Patients Only)       Balance           Standing  balance support: Bilateral upper extremity supported Standing balance-Leahy Scale: Poor                      Cognition Arousal/Alertness: Awake/alert Behavior During Therapy: Flat affect;Impulsive Overall Cognitive Status: No family/caregiver present to determine baseline cognitive functioning                      Exercises      General Comments General comments (skin integrity, edema, etc.): Mitts in place bil. hands      Pertinent Vitals/Pain Pain Assessment: No/denies pain    Home Living                      Prior Function            PT Goals (current goals can now be found in the care plan section) Progress towards PT goals: Progressing toward goals    Frequency  Min 2X/week    PT Plan Current plan remains appropriate    Co-evaluation             End of Session Equipment Utilized During Treatment: Gait belt Activity Tolerance: Patient limited by fatigue Patient left: in chair;with call bell/phone within reach;with chair alarm set     Time: 4098-1191 PT Time Calculation (min) (ACUTE ONLY): 12 min  Charges:  $Gait Training: 8-22 mins                    G Codes:      Vena Austria August 23, 2015, 7:18 PM Durenda Hurt. Earlene Plater, PT,  [place] Pager [phone]

## 2015-08-01 NOTE — NC FL2 (Signed)
[place] MEDICAID FL2 LEVEL OF CARE SCREENING TOOL     IDENTIFICATION  Patient Name: Jeff Foster Birthdate: March 15, 1961 Sex: male Admission Date (Current Location): 07/18/2015  [facility] and [place] Number:  Producer, television/film/video and Address:  [facility] [place], [address], [place], [place] [address]      Provider Number: [id number]  Attending Physician Name and Address:  Meredeth Ide, MD  Relative Name and Phone Number:  Marena Chancy    [phone]    Current Level of Care: Hospital Recommended Level of Care: Skilled Nursing Facility Prior Approval Number:    Date Approved/Denied:   PASRR Number:    Discharge Plan: SNF    Current Diagnoses: Patient Active Problem List   Diagnosis Date Noted  . DNR (do not resuscitate) 07/25/2015  . Aspiration pneumonia (HCC) 07/25/2015  . Palliative care encounter 07/25/2015  . Dysphagia   . Altered mental status   . UTI (lower urinary tract infection) 07/18/2015  . Cognitive decline 07/18/2015  . Acute encephalopathy 07/18/2015  . Mental retardation 07/18/2015  . Hypertension 07/18/2015  . History of CVA (cerebrovascular accident) 07/18/2015  . Encephalopathy acute 07/18/2015  . Dehydration     Orientation RESPIRATION BLADDER Height & Weight    Self  Normal Continent   193 lbs.  BEHAVIORAL SYMPTOMS/MOOD NEUROLOGICAL BOWEL NUTRITION STATUS  Other (Comment) (Pt can have outbursts however Pt is more in control of his emotions now. )   Incontinent Diet (DYS II---Finely Chopped, soft / moist)  AMBULATORY STATUS COMMUNICATION OF NEEDS Skin   Total Care Verbally Skin abrasions (Knees )                       Personal Care Assistance Level of Assistance  Total care       Total Care Assistance: Maximum assistance   Functional Limitations Info  Speech (Slurs Speech: MR)     Speech Info: Impaired    SPECIAL CARE FACTORS FREQUENCY  PT (By licensed PT)     PT Frequency: Minx2 per week             Contractures Contractures Info: Not present    Additional Factors Info  Code Status, Allergies Code Status Info: DNR Allergies Info: Peanut containing drug products           Current Medications (08/01/2015):  This is the current hospital active medication list Current Facility-Administered Medications  Medication Dose Route Frequency Provider Last Rate Last Dose  . acetaminophen (TYLENOL) tablet 650 mg  650 mg Oral Q6H PRN Meredith Pel, NP   650 mg at 07/26/15 2146   Or  . acetaminophen (TYLENOL) suppository 650 mg  650 mg Rectal Q6H PRN Meredith Pel, NP      . albuterol (PROVENTIL) (2.5 MG/3ML) 0.083% nebulizer solution 3 mL  3 mL Inhalation Q6H PRN Meredith Pel, NP      . antiseptic oral rinse (CPC / CETYLPYRIDINIUM CHLORIDE 0.05%) solution 7 mL  7 mL Mouth Rinse q12n4p Zannie Cove, MD   7 mL at 07/31/15 1615  . aspirin EC tablet 81 mg  81 mg Oral Daily Meredith Pel, NP   81 mg at 08/01/15 5621  . atorvastatin (LIPITOR) tablet 80 mg  80 mg Oral Daily Meredith Pel, NP   80 mg at 08/01/15 0924  . bisacodyl (DULCOLAX) suppository 10 mg  10 mg Rectal Daily PRN Meredith Pel, NP      .  chlorhexidine (PERIDEX) 0.12 % solution 15 mL  15 mL Mouth Rinse BID Zannie Cove, MD   15 mL at 08/01/15 0925  . clonazePAM (KLONOPIN) tablet 0.5 mg  0.5 mg Oral BID Zannie Cove, MD   0.5 mg at 08/01/15 0924  . divalproex (DEPAKOTE ER) 24 hr tablet 1,500 mg  1,500 mg Oral QHS Zannie Cove, MD   1,500 mg at 07/30/15 2114  . enoxaparin (LOVENOX) injection 40 mg  40 mg Subcutaneous Q24H Meredith Pel, NP   40 mg at 07/31/15 2019  . famotidine (PEPCID) tablet 10 mg  10 mg Oral Once Rolan Lipa, NP   10 mg at 07/20/15 2324  . food thickener (THICK IT) powder   Oral PRN Zannie Cove, MD      . levothyroxine (SYNTHROID, LEVOTHROID) tablet 25 mcg  25 mcg Oral QAC breakfast Meredith Pel, NP   25 mcg at 08/01/15 1610  . ondansetron (ZOFRAN) tablet 4 mg  4 mg  Oral Q6H PRN Meredith Pel, NP       Or  . ondansetron [facility]) injection 4 mg  4 mg Intravenous Q6H PRN Meredith Pel, NP      . Oxcarbazepine (TRILEPTAL) tablet 300 mg  300 mg Oral BID Leata Mouse, MD   300 mg at 08/01/15 0924  . pantoprazole (PROTONIX) EC tablet 40 mg  40 mg Oral Daily Meredith Pel, NP   40 mg at 08/01/15 0924  . risperiDONE (RISPERDAL) tablet 1 mg  1 mg Oral BID Leata Mouse, MD   1 mg at 08/01/15 1034  . scopolamine (TRANSDERM-SCOP) 1 MG/3DAYS 1.5 mg  1 patch Transdermal Q72H Meredith Pel, NP   1.5 mg at 07/30/15 2116  . senna-docusate (Senokot-S) tablet 1 tablet  1 tablet Oral QHS PRN Meredith Pel, NP   1 tablet at 07/20/15 2103  . sodium chloride 0.9 % 1,000 mL with potassium chloride 40 mEq infusion   Intravenous Continuous Clydia Llano, MD 75 mL/hr at 08/01/15 1144    . tamsulosin (FLOMAX) capsule 0.4 mg  0.4 mg Oral QHS Meredith Pel, NP   0.4 mg at 07/30/15 2116  . ziprasidone (GEODON) injection 10 mg  10 mg Intramuscular Q2H PRN Leata Mouse, MD   10 mg at 07/29/15 1048     Discharge Medications: Please see discharge summary for a list of discharge medications.  Relevant Imaging Results:  Relevant Lab Results:   Additional Information  (SSN:# )  Jeff Foster

## 2015-08-01 NOTE — Progress Notes (Signed)
Restraints were removed at the start of my shift 0730 this morning because patient did not appear to need them.  He did well without the restraints, but safety mittens were left in place to prevent pulling on foley/iv tubes.  Frequent toileting, feeding, and verbal contacts allowed the patient to remain safe without restraints. Restraint flowsheet documentation up to date. MD was notified that restraints were no longer necessary and will discontinue order.

## 2015-08-02 DIAGNOSIS — I10 Essential (primary) hypertension: Secondary | ICD-10-CM

## 2015-08-02 NOTE — Progress Notes (Signed)
[facility] PROGRESS NOTE  Jeff Foster ZOX:[id number] DOB: 07-04-1961 DOA: 07/18/2015 PCP: No primary care provider on file.  Assessment/Plan:  Encephalopathy - possibly secondary to UTI, meds. -now awake, but very aggressive, confused On PRN Haldol, got Iv ativan PRN x1 last night per staff which helped him - Head CT scan negative for acute findings. Normal ammonia.  - EEG negative for seizures, Urine Cx polymicorbial, Stop Rocephin  - Blood and uirne cx negative,  - Baseline mentation quite poor for 3 weeks, total care at group home per staff, 2plus assistance , unable to carry a conversation, per Jeff Foster at group home he is mostly confused, aggressive, and rarely appropriate - Depakote dose was reduced, stopped thorazine dose, due to concern for over medication - SLp eval and diet when mentation improves - Per Staff at Group home he came with diagnosis of Moderate MR, intermittent Explosive disorder, Paranoia, Schizophrnia, some signs of Dementia -Depakote to  QHS, add Risperidone, and Klonopin 0.5mg  BID. -Psychiatry re-consulted  Dysphagia:  -Appreciate relative consultation, appears to be chronic dysphagia, family wants him to have thinly chopped food. -Has had a stroke in March 2015.   Sinus tachycardia, resolved. Dry mucous membranes, suspect dehyration. He is s/p 2 liters bolus in ED -Continue IVF  Hypertension. Controlled  Transaminitis. No baseline LFTs to compare. Possibly secondary to medications.  -no abd symptoms, cmet in am  Hypothyroidism.  -continue home synthroid. -TSH WNL  GERD. -continue daily PPI  History of CVA per records brought in by Group Home.   Hypokalemia Replete with oral supplements, check BMP in a.m.  Thrombocytopenia, resolved Platelets are back to 185 Platelets was 96 on admission  Palliative care was consulted- recommendations patient is DO NOT RESUSCITATE/DO NOT INTUBATE and no artificial feeding. Finely  chopped moist diet with aspiration precautions  DVT prophylaxis Lovenox  Code Status: DNR Family Communication: no family at bedside Disposition Plan: Skilled nursing facility when bed available   Consultants:  Palliative care  Psychiatry  Procedures:  None  Antibiotics:  None  HPI/Subjective: 55 year old male with past history of moderate mental retardation and significant psychiatric history of intermittent Explosive disorder, Paranoia, Schizophrnia, h/o CVA, COPD, just recently discharged from hospital in [place] to Group Home in [place] around Christmas (12/24 or 12/16). At the group home pt was confused, somnolent, total care at group home per staff, requiring 2plus assistance , unable to carry a conversation, per Jeff Foster at group home he is mostly confused, aggressive, and rarely appropriate. He was brought to [facility] 1/13 due to worsening lethargy, behavioral issues. CT head unremarkable, UA was abnormal, has been on Abx, due to increased lethargy idoses of Thorazine and Depakote was reduced, since last evening waking up some, and very aggressive behavior, hitting staff. -Urine Cx and EEG negative  Patient feels better today, denies any complaints. Awaiting placement at skilled facility  Objective: Filed Vitals:   08/01/15 2108 08/02/15 0555  BP: 122/73 114/87  Pulse: 66 86  Temp: 97.7 F (36.5 C) 98.7 F (37.1 C)  Resp: 18 18    Intake/Output Summary (Last 24 hours) at 08/02/15 1127 Last data filed at 08/02/15 1110  Gross per 24 hour  Intake 2846.25 ml  Output   4050 ml  Net -1203.75 ml   Filed Weights   07/31/15 0300 08/01/15 0623 08/02/15 0500  Weight: 89.359 kg (197 lb) 87.68 kg (193 lb 4.8 oz) 89.812 kg (198 lb)    Exam:   General:  Alert, in restraints  with [name] and mittens  Cardiovascular: S1-S2 regular  Respiratory: Clear to auscultation bilaterally  Abdomen: Soft, nontender, no organomegaly  Musculoskeletal: No  cyanosis/clubbing/edema. Extremities   Data Reviewed: Basic Metabolic Panel:  Recent Labs Lab 07/31/15 0830  NA 141  K 3.7  CL 109  CO2 25  GLUCOSE 67  BUN <5*  CREATININE 0.58*  CALCIUM 8.9   Liver Function Tests: No results for input(s): AST, ALT, ALKPHOS, BILITOT, PROT, ALBUMIN in the last 168 hours.   Studies: No results found.  Scheduled Meds: . antiseptic oral rinse  7 mL Mouth Rinse q12n4p  . aspirin EC  81 mg Oral Daily  . atorvastatin  80 mg Oral Daily  . chlorhexidine  15 mL Mouth Rinse BID  . clonazePAM  0.5 mg Oral BID  . divalproex  1,500 mg Oral QHS  . enoxaparin (LOVENOX) injection  40 mg Subcutaneous Q24H  . famotidine  10 mg Oral Once  . levothyroxine  25 mcg Oral QAC breakfast  . OXcarbazepine  300 mg Oral BID  . pantoprazole  40 mg Oral Daily  . risperiDONE  1 mg Oral BID  . scopolamine  1 patch Transdermal Q72H  . tamsulosin  0.4 mg Oral QHS   Continuous Infusions: . sodium chloride 0.9 % 1,000 mL with potassium chloride 40 mEq infusion 75 mL/hr at 08/01/15 1144    Principal Problem:   Acute encephalopathy Active Problems:   UTI (lower urinary tract infection)   Cognitive decline   Mental retardation   Hypertension   History of CVA (cerebrovascular accident)   Encephalopathy acute   DNR (do not resuscitate)   Aspiration pneumonia [name])   Palliative care encounter   Dysphagia   Altered mental status    Time spent: 25 min    [facility]  [facility] Pager [phone]. If 7PM-7AM, please contact night-coverage at [email or web address], password [facility] 08/02/2015, 11:27 AM  LOS: 14 days

## 2015-08-03 ENCOUNTER — Inpatient Hospital Stay (HOSPITAL_COMMUNITY): Payer: Medicare Other

## 2015-08-03 NOTE — Progress Notes (Signed)
[facility] PROGRESS NOTE  Mark Hassey WUJ:[id number] DOB: 17-Feb-1961 DOA: 07/18/2015 PCP: No primary care provider on file.  Assessment/Plan:  Encephalopathy - possibly secondary to UTI, meds. -now awake, but very aggressive, confused On PRN Haldol, got Iv ativan PRN x1 last night per staff which helped him - Head CT scan negative for acute findings. Normal ammonia.  - EEG negative for seizures, Urine Cx polymicorbial, Stop Rocephin  - Blood and uirne cx negative,  - Baseline mentation quite poor for 3 weeks, total care at group home per staff, 2plus assistance , unable to carry a conversation, per [name] at group home he is mostly confused, aggressive, and rarely appropriate - Depakote dose was reduced, stopped thorazine dose, due to concern for over medication - SLp eval and diet when mentation improves - Per Staff at Group home he came with diagnosis of Moderate MR, intermittent Explosive disorder, Paranoia, Schizophrnia, some signs of Dementia -Depakote to  QHS, add Risperidone, and Klonopin 0.5mg  BID. -Psychiatry re-consulted  ? Pneumonia Patient is currently on dysphagia 2 diet and high risk for aspiration. He does have bilateral rhonchi. Will check chest x-ray to rule out pneumonia  Dysphagia:  -Appreciate palliative care consultation, appears to be chronic dysphagia, family wants him to have thinly chopped food. -Has had a stroke in March 2015.   Sinus tachycardia, resolved. Dry mucous membranes, suspect dehyration. He is s/p 2 liters bolus in ED -Continue IVF  Hypertension. Controlled  Transaminitis. No baseline LFTs to compare. Possibly secondary to medications.  -no abd symptoms, cmet in am  Hypothyroidism.  -continue home synthroid. -TSH WNL  GERD. -continue daily PPI  History of CVA per records brought in by Group Home.   Hypokalemia Replete with oral supplements, check BMP in a.m.  Thrombocytopenia, resolved Platelets are  back to 185 Platelets was 96 on admission  Palliative care was consulted- recommendations patient is DO NOT RESUSCITATE/DO NOT INTUBATE and no artificial feeding. Finely chopped moist diet with aspiration precautions  DVT prophylaxis Lovenox  Code Status: DNR Family Communication: no family at bedside Disposition Plan: Skilled nursing facility when bed available   Consultants:  Palliative care  Psychiatry  Procedures:  None  Antibiotics:  None  HPI/Subjective: 55 year old male with past history of moderate mental retardation and significant psychiatric history of intermittent Explosive disorder, Paranoia, Schizophrnia, h/o CVA, COPD, just recently discharged from hospital in [place] to Group Home in [place] around Christmas (12/24 or 12/16). At the group home pt was confused, somnolent, total care at group home per staff, requiring 2plus assistance , unable to carry a conversation, per [name] at group home he is mostly confused, aggressive, and rarely appropriate. He was brought to [facility] 1/13 due to worsening lethargy, behavioral issues. CT head unremarkable, UA was abnormal, has been on Abx, due to increased lethargy idoses of Thorazine and Depakote was reduced, since last evening waking up some, and very aggressive behavior, hitting staff. -Urine Cx and EEG negative  Patient denies shortness of breath. Awaiting placement to skilled facility  Objective: Filed Vitals:   08/02/15 2133 08/03/15 0458  BP: 112/77 99/63  Pulse: 88 88  Temp: 98.6 F (37 C) 97.9 F (36.6 C)  Resp: 19 19    Intake/Output Summary (Last 24 hours) at 08/03/15 1137 Last data filed at 08/03/15 1039  Gross per 24 hour  Intake   1100 ml  Output   2150 ml  Net  -1050 ml   Filed Weights   08/01/15 0623 08/02/15  0500 08/03/15 0458  Weight: 87.68 kg (193 lb 4.8 oz) 89.812 kg (198 lb) 88.76 kg (195 lb 10.9 oz)    Exam:   General:  Alert, in restraints with [name] and  mittens  Cardiovascular: S1-S2 regular  Respiratory: Bilateral rhonchi  Abdomen: Soft, nontender, no organomegaly  Musculoskeletal: No cyanosis/clubbing/edema. Extremities   Data Reviewed: Basic Metabolic Panel:  Recent Labs Lab 07/31/15 0830  NA 141  K 3.7  CL 109  CO2 25  GLUCOSE 67  BUN <5*  CREATININE 0.58*  CALCIUM 8.9   Liver Function Tests: No results for input(s): AST, ALT, ALKPHOS, BILITOT, PROT, ALBUMIN in the last 168 hours.   Studies: No results found.  Scheduled Meds: . antiseptic oral rinse  7 mL Mouth Rinse q12n4p  . aspirin EC  81 mg Oral Daily  . atorvastatin  80 mg Oral Daily  . chlorhexidine  15 mL Mouth Rinse BID  . clonazePAM  0.5 mg Oral BID  . divalproex  1,500 mg Oral QHS  . enoxaparin (LOVENOX) injection  40 mg Subcutaneous Q24H  . famotidine  10 mg Oral Once  . levothyroxine  25 mcg Oral QAC breakfast  . OXcarbazepine  300 mg Oral BID  . pantoprazole  40 mg Oral Daily  . risperiDONE  1 mg Oral BID  . scopolamine  1 patch Transdermal Q72H  . tamsulosin  0.4 mg Oral QHS   Continuous Infusions: . sodium chloride 0.9 % 1,000 mL with potassium chloride 40 mEq infusion 75 mL/hr at 08/01/15 1144    Principal Problem:   Acute encephalopathy Active Problems:   UTI (lower urinary tract infection)   Cognitive decline   Mental retardation   Hypertension   History of CVA (cerebrovascular accident)   Encephalopathy acute   DNR (do not resuscitate)   Aspiration pneumonia [facility])   Palliative care encounter   Dysphagia   Altered mental status    Time spent: 25 min    [facility]  [facility] Pager [phone]. If 7PM-7AM, please contact night-coverage at [email or web address], password [facility] 08/03/2015, 11:37 AM  LOS: 15 days

## 2015-08-03 NOTE — Progress Notes (Signed)
Faxed 30-Day Note, as well as supplemental information, to [facility] for [facility].  Amanda Crystol Walpole, LCSW Clinical Social Work [phone]  

## 2015-08-03 NOTE — Progress Notes (Signed)
SLP Cancellation Note  Patient Details Name: Jeff Foster MRN: [id number] DOB: 05-16-1961   Cancelled treatment:       Reason Eval/Treat Not Completed: Other (comment) Orders received for new swallow evaluation. MBS already completed this admission (1/19) with results available under imaging section. Per MBS, pt was at a high aspiration risk with any PO intake. Per palliative care discussion with family, this has been a chronic issue and they wish to continue with baseline diet (current Dys 2 diet and thin liquids). Please page SLP [phone]) if there are any additional, acute concerns about pt's swallowing function.   Maxcine Ham, M.A. CCC-SLP [phone]  Maxcine Ham 08/03/2015, 8:16 AM

## 2015-08-04 MED ORDER — IPRATROPIUM-ALBUTEROL 0.5-2.5 (3) MG/3ML IN SOLN: 3.0000 mL | Freq: Four times a day (QID) | RESPIRATORY_TRACT | Status: DC | PRN

## 2015-08-04 MED ORDER — CLONAZEPAM 0.5 MG PO TABS: 0.5000 mg | ORAL_TABLET | Freq: Two times a day (BID) | ORAL | Status: DC

## 2015-08-04 MED ORDER — STARCH (THICKENING) PO POWD: ORAL | Status: DC

## 2015-08-04 MED ORDER — OXCARBAZEPINE 300 MG PO TABS: 300.0000 mg | ORAL_TABLET | Freq: Two times a day (BID) | ORAL | Status: DC

## 2015-08-04 MED ORDER — ZIPRASIDONE MESYLATE 20 MG IM SOLR: 10.0000 mg | INTRAMUSCULAR | Status: DC | PRN

## 2015-08-04 MED ORDER — RISPERIDONE 1 MG PO TABS: 1.0000 mg | ORAL_TABLET | Freq: Two times a day (BID) | ORAL | Status: DC

## 2015-08-04 MED ORDER — DIVALPROEX SODIUM ER 500 MG PO TB24: 1500.0000 mg | ORAL_TABLET | Freq: Every day | ORAL | Status: DC

## 2015-08-04 MED ORDER — IPRATROPIUM-ALBUTEROL 0.5-2.5 (3) MG/3ML IN SOLN: 3.0000 mL | Freq: Four times a day (QID) | RESPIRATORY_TRACT | Status: DC

## 2015-08-04 NOTE — Progress Notes (Signed)
Paged on call physician to notify him of patient's fall.

## 2015-08-04 NOTE — Clinical Social Work Placement (Signed)
   CLINICAL SOCIAL WORK PLACEMENT  NOTE  Date:  08/04/2015  Patient Details  Name: Jeff Foster MRN: [id number] Date of Birth: 1960/10/08  Clinical Social Work is seeking post-discharge placement for this patient at the Skilled  Nursing Facility level of care (*CSW will initial, date and re-position this form in  chart as items are completed):  Yes   Patient/family provided with [place] Clinical Social Work Department's list of facilities offering this level of care within the geographic area requested by the patient (or if unable, by the patient's family).  Yes   Patient/family informed of their freedom to choose among providers that offer the needed level of care, that participate in Medicare, Medicaid or managed care program needed by the patient, have an available bed and are willing to accept the patient.      Patient/family informed of [facility] ownership interest in [facility] and [facility], as well as of the fact that they are under no obligation to receive care at these facilities.  PASRR submitted to EDS on 08/03/15     PASRR number received on       Existing PASRR number confirmed on       FL2 transmitted to all facilities in geographic area requested by pt/family on 08/04/15     FL2 transmitted to all facilities within larger geographic area on 08/04/15     Patient informed that his/her managed care company has contracts with or will negotiate with certain facilities, including the following:        Yes   Patient/family informed of bed offers received.  Patient chooses bed at  [facility])     Physician recommends and patient chooses bed at      Patient to be transferred to  [facility]) on 08/04/15.  Patient to be transferred to facility by  [facility])     Patient family notified on 08/04/15 of transfer.  Name of family member notified:  Pricilla Handler and Care Coordinator: Elly Modena     PHYSICIAN Please prepare  priority discharge summary, including medications, Please sign DNR     Additional Comment:    _______________________________________________ Loleta Dicker, LCSW 08/04/2015, 4:48 PM

## 2015-08-04 NOTE — Progress Notes (Signed)
Patient has accepted bed offer at [facility].  Per facility representative Mal Amabile, patient can arrive to facility at anytime. CSW informed patient, family, and RN of transfer time. Family is appreciative of CSW services.  Patient to be transported via [place] to [facility] and [address]. D/C Summary to be sent via Hub system. No further needs were requested at this time. CSW to sign off.   Please re-consult if further CSW needs arise.    Fernande Boyden, LCSWA Clinical Social Worker [facility] Ph: [phone]

## 2015-08-04 NOTE — Progress Notes (Signed)
CSW received phone call from [place] at [facility]. Facility has offered a bed for the patient. Per Mal Amabile, patient can come today however the facility will need family to complete paperwork first. Mal Amabile is aware that patient has a Recruitment consultant due to falls. Facility will still accept the patient since the sitter is not for behavior.  CSW contacted Sister Leeann Must at [phone] to inform of placement arrangement for patient. Sister reports she does not drive so she will not be able to get to the facility to sign paperwork for the patient. Sister informed CSW that patient has a Gaffer by the name of Elly Modena, who can receive the paperwork via fax and could get to her.   CSW contacted Mal Amabile to inform. Per Mal Amabile, he just needs verbal consent from sister on today and paperwork can be signed tomorrow. Mal Amabile will contact sister.   Once updated, CSW will arrange transportation to facility.   Fernande Boyden, LCSWA Clinical Social Worker [facility] Ph: [phone]

## 2015-08-04 NOTE — Progress Notes (Signed)
[place] number has not come back yet for patient. Transportation cancelled. MD aware to cancel discharge. Facility representative [facility] informed. Will touch basis in the morning regarding patient.   Fernande Boyden, LCSWA Clinical Social Worker [facility] Ph: [phone]

## 2015-08-04 NOTE — NC FL2 (Signed)
[place] MEDICAID FL2 LEVEL OF CARE SCREENING TOOL     IDENTIFICATION  Patient Name: Jeff Foster Birthdate: June 05, 1961 Sex: male Admission Date (Current Location): 07/18/2015  [facility] and [place] Number:  Producer, television/film/video and Address:  [facility], [address], [place], [place] [address]      Provider Number: [id number]  Attending Physician Name and Address:  Meredeth Ide, MD  Relative Name and Phone Number:  Marena Chancy    [phone]    Current Level of Care: Hospital Recommended Level of Care: Skilled Nursing Facility Prior Approval Number:    Date Approved/Denied:   PASRR Number:    Discharge Plan: SNF    Current Diagnoses: Patient Active Problem List   Diagnosis Date Noted  . DNR (do not resuscitate) 07/25/2015  . Aspiration pneumonia (HCC) 07/25/2015  . Palliative care encounter 07/25/2015  . Dysphagia   . Altered mental status   . UTI (lower urinary tract infection) 07/18/2015  . Cognitive decline 07/18/2015  . Acute encephalopathy 07/18/2015  . Mental retardation 07/18/2015  . Hypertension 07/18/2015  . History of CVA (cerebrovascular accident) 07/18/2015  . Encephalopathy acute 07/18/2015  . Dehydration     Orientation RESPIRATION BLADDER Height & Weight     Self  Normal Continent Weight: 190 lb 1.6 oz (86.229 kg) Height:     BEHAVIORAL SYMPTOMS/MOOD NEUROLOGICAL BOWEL NUTRITION STATUS  Other (Comment) (Pt can have outbursts however Pt is more in control of his emotions now. )   Incontinent Diet (DYS II---Finely Chopped, soft / moist)  AMBULATORY STATUS COMMUNICATION OF NEEDS Skin   Total Care Verbally Skin abrasions (Knees )                       Personal Care Assistance Level of Assistance  Total care       Total Care Assistance: Maximum assistance   Functional Limitations Info  Speech (Slurs Speech: MR)     Speech Info: Impaired    SPECIAL CARE FACTORS FREQUENCY  PT (By licensed PT)     PT  Frequency: Minx2 per week              Contractures Contractures Info: Not present    Additional Factors Info  Code Status, Allergies Code Status Info: DNR Allergies Info: Peanut containing drug products           Current Medications (08/04/2015):  This is the current hospital active medication list Current Facility-Administered Medications  Medication Dose Route Frequency Provider Last Rate Last Dose  . acetaminophen (TYLENOL) tablet 650 mg  650 mg Oral Q6H PRN Meredith Pel, NP   650 mg at 08/01/15 2006   Or  . acetaminophen (TYLENOL) suppository 650 mg  650 mg Rectal Q6H PRN Meredith Pel, NP      . antiseptic oral rinse (CPC / CETYLPYRIDINIUM CHLORIDE 0.05%) solution 7 mL  7 mL Mouth Rinse q12n4p Zannie Cove, MD   7 mL at 07/31/15 1615  . aspirin EC tablet 81 mg  81 mg Oral Daily Meredith Pel, NP   81 mg at 08/04/15 1043  . atorvastatin (LIPITOR) tablet 80 mg  80 mg Oral Daily Meredith Pel, NP   80 mg at 08/04/15 1043  . bisacodyl (DULCOLAX) suppository 10 mg  10 mg Rectal Daily PRN Meredith Pel, NP      . chlorhexidine (PERIDEX) 0.12 % solution 15 mL  15 mL Mouth Rinse BID Zannie Cove,  MD   15 mL at 08/04/15 1042  . clonazePAM (KLONOPIN) tablet 0.5 mg  0.5 mg Oral BID Zannie Cove, MD   0.5 mg at 08/04/15 1043  . divalproex (DEPAKOTE ER) 24 hr tablet 1,500 mg  1,500 mg Oral QHS Zannie Cove, MD   1,500 mg at 08/03/15 2310  . enoxaparin (LOVENOX) injection 40 mg  40 mg Subcutaneous Q24H Meredith Pel, NP   40 mg at 08/03/15 2311  . famotidine (PEPCID) tablet 10 mg  10 mg Oral Once Rolan Lipa, NP   10 mg at 07/20/15 2324  . food thickener (THICK IT) powder   Oral PRN Zannie Cove, MD      . ipratropium-albuterol (DUONEB) 0.5-2.5 (3) MG/3ML nebulizer solution 3 mL  3 mL Nebulization Q6H PRN Meredeth Ide, MD      . levothyroxine (SYNTHROID, LEVOTHROID) tablet 25 mcg  25 mcg Oral QAC breakfast Meredith Pel, NP   25 mcg at 08/04/15  0454  . ondansetron (ZOFRAN) tablet 4 mg  4 mg Oral Q6H PRN Meredith Pel, NP       Or  . ondansetron [facility]) injection 4 mg  4 mg Intravenous Q6H PRN Meredith Pel, NP      . Oxcarbazepine (TRILEPTAL) tablet 300 mg  300 mg Oral BID Leata Mouse, MD   300 mg at 08/04/15 1042  . pantoprazole (PROTONIX) EC tablet 40 mg  40 mg Oral Daily Meredith Pel, NP   40 mg at 08/04/15 1043  . risperiDONE (RISPERDAL) tablet 1 mg  1 mg Oral BID Leata Mouse, MD   1 mg at 08/04/15 1043  . scopolamine (TRANSDERM-SCOP) 1 MG/3DAYS 1.5 mg  1 patch Transdermal Q72H Meredith Pel, NP   1.5 mg at 08/02/15 2317  . senna-docusate (Senokot-S) tablet 1 tablet  1 tablet Oral QHS PRN Meredith Pel, NP   1 tablet at 07/20/15 2103  . sodium chloride 0.9 % 1,000 mL with potassium chloride 40 mEq infusion   Intravenous Continuous Clydia Llano, MD 75 mL/hr at 08/01/15 1144    . tamsulosin (FLOMAX) capsule 0.4 mg  0.4 mg Oral QHS Meredith Pel, NP   0.4 mg at 08/03/15 2311  . ziprasidone (GEODON) injection 10 mg  10 mg Intramuscular Q2H PRN Leata Mouse, MD   10 mg at 08/04/15 0050     Discharge Medications: Please see discharge summary for a list of discharge medications.  Relevant Imaging Results:  Relevant Lab Results:   Additional Information  (SSN:# )  Loleta Dicker, LCSW
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
## 2015-08-04 NOTE — Progress Notes (Signed)
[facility] PROGRESS NOTE  Jeff Foster ZOX:[id number] DOB: 06-17-1961 DOA: 07/18/2015 PCP: No primary care provider on file.  Assessment/Plan:  Encephalopathy - possibly secondary to UTI, meds. -now awake, but very aggressive, confused On PRN Haldol, got Iv ativan PRN x1 last night per staff which helped him - Head CT scan negative for acute findings. Normal ammonia.  - EEG negative for seizures, Urine Cx polymicorbial, Stop Rocephin  - Blood and uirne cx negative,  - Baseline mentation quite poor for 3 weeks, total care at group home per staff, 2plus assistance , unable to carry a conversation, per [name] at group home he is mostly confused, aggressive, and rarely appropriate - Depakote dose was reduced, stopped thorazine dose, due to concern for over medication - SLp eval and diet when mentation improves - Per Staff at Group home he came with diagnosis of Moderate MR, intermittent Explosive disorder, Paranoia, Schizophrnia, some signs of Dementia -Depakote to  QHS, add Risperidone, and Klonopin 0.5mg  BID. -Psychiatry re-consulted - CXR on 08/02/14 was negative for pneumonia   Dysphagia:  -Appreciate palliative care consultation, appears to be chronic dysphagia, family wants him to have thinly chopped food. -Has had a stroke in March 2015.   Sinus tachycardia, resolved. Dry mucous membranes, suspect dehyration. He is s/p 2 liters bolus in ED -Continue IVF  Hypertension. Controlled  Transaminitis.  No baseline LFTs to compare. Possibly secondary to medications.  -no abd symptoms, cmet in am  Hypothyroidism.  -continue home synthroid. -TSH WNL  GERD. -continue daily PPI  History of CVA per records brought in by Group Home.   Hypokalemia Replete with oral supplements, check BMP in a.m.  Thrombocytopenia, resolved Platelets are back to 185 Platelets was 96 on admission  Palliative care was consulted- recommendations patient is DO NOT  RESUSCITATE/DO NOT INTUBATE and no artificial feeding. Finely chopped moist diet with aspiration precautions  DVT prophylaxis Lovenox  Code Status: DNR Family Communication: no family at bedside Disposition Plan: Skilled nursing facility when bed available   Consultants:  Palliative care  Psychiatry  Procedures:  None  Antibiotics:  None  HPI/Subjective: 55 year old male with past history of moderate mental retardation and significant psychiatric history of intermittent Explosive disorder, Paranoia, Schizophrnia, h/o CVA, COPD, just recently discharged from hospital in [place] to Group Home in [place] around Christmas (12/24 or 12/16). At the group home pt was confused, somnolent, total care at group home per staff, requiring 2plus assistance , unable to carry a conversation, per [name] at group home he is mostly confused, aggressive, and rarely appropriate. He was brought to [facility] 1/13 due to worsening lethargy, behavioral issues. CT head unremarkable, UA was abnormal, has been on Abx, due to increased lethargy idoses of Thorazine and Depakote was reduced, since last evening waking up some, and very aggressive behavior, hitting staff. -Urine Cx and EEG negative  Patient denies shortness of breath. Awaiting placement to skilled facility . Chest x-ray on 08/03/2015 was negative for pneumonia.  Objective: Filed Vitals:   08/04/15 0444 08/04/15 0936  BP: 100/57 101/54  Pulse: 81 82  Temp: 99.2 F (37.3 C) 98.5 F (36.9 C)  Resp: 19 20    Intake/Output Summary (Last 24 hours) at 08/04/15 1401 Last data filed at 08/04/15 1029  Gross per 24 hour  Intake   1380 ml  Output   1200 ml  Net    180 ml   Filed Weights   08/02/15 0500 08/03/15 0458 08/04/15 0444  Weight: 89.812 kg (  198 lb) 88.76 kg (195 lb 10.9 oz) 86.229 kg (190 lb 1.6 oz)    Exam:   General:  Alert, in restraints with [name] and mittens  Cardiovascular: S1-S2  regular  Respiratory: Bilateral rhonchi  Abdomen: Soft, nontender, no organomegaly  Musculoskeletal: No cyanosis/clubbing/edema. Extremities   Data Reviewed: Basic Metabolic Panel:  Recent Labs Lab 07/31/15 0830  NA 141  K 3.7  CL 109  CO2 25  GLUCOSE 67  BUN <5*  CREATININE 0.58*  CALCIUM 8.9   Liver Function Tests: No results for input(s): AST, ALT, ALKPHOS, BILITOT, PROT, ALBUMIN in the last 168 hours.   Studies: Dg Chest 2 View  08/03/2015  CLINICAL DATA:  Cough.  Evaluate for pneumonia. EXAM: CHEST  2 VIEW COMPARISON:  07/18/2015 FINDINGS: Mild peribronchial thickening. Heart and mediastinal contours are within normal limits. No focal opacities or effusions. No acute bony abnormality. IMPRESSION: Mild bronchitic changes. Electronically Signed   By: Charlett Nose M.D.   On: 08/03/2015 13:24    Scheduled Meds: . antiseptic oral rinse  7 mL Mouth Rinse q12n4p  . aspirin EC  81 mg Oral Daily  . atorvastatin  80 mg Oral Daily  . chlorhexidine  15 mL Mouth Rinse BID  . clonazePAM  0.5 mg Oral BID  . divalproex  1,500 mg Oral QHS  . enoxaparin (LOVENOX) injection  40 mg Subcutaneous Q24H  . famotidine  10 mg Oral Once  . levothyroxine  25 mcg Oral QAC breakfast  . OXcarbazepine  300 mg Oral BID  . pantoprazole  40 mg Oral Daily  . risperiDONE  1 mg Oral BID  . scopolamine  1 patch Transdermal Q72H  . tamsulosin  0.4 mg Oral QHS   Continuous Infusions: . sodium chloride 0.9 % 1,000 mL with potassium chloride 40 mEq infusion 75 mL/hr at 08/01/15 1144    Principal Problem:   Acute encephalopathy Active Problems:   UTI (lower urinary tract infection)   Cognitive decline   Mental retardation   Hypertension   History of CVA (cerebrovascular accident)   Encephalopathy acute   DNR (do not resuscitate)   Aspiration pneumonia [facility])   Palliative care encounter   Dysphagia   Altered mental status    Time spent: 25 min    [facility]  [facility] Pager [phone]. If 7PM-7AM, please contact night-coverage at [email or web address], password [facility] 08/04/2015, 2:01 PM  LOS: 16 days

## 2015-08-05 NOTE — Progress Notes (Signed)
Physical Therapy Treatment Patient Details Name: Rambo Sarafian MRN: [id number] DOB: [birth year] Today's Date: 08/05/2015    History of Present Illness Pt admitted with acute encephalopathy. 55 y.o. male with mental retardation from group home with apparent progressive decline in function and diet tolerance    PT Comments    Pt with excellent progression today able to walk 60' x 2 with chair to follow. Pt able to state name and place, disoriented to date, pleasant throughout. Pt able to follow one step commands and participate in HEP although limited. Will continue to follow.   Follow Up Recommendations  SNF;Supervision/Assistance - 24 hour     Equipment Recommendations       Recommendations for Other Services       Precautions / Restrictions Precautions Precautions: Fall    Mobility  Bed Mobility Overal bed mobility: Needs Assistance Bed Mobility: Supine to Sit     Supine to sit: Supervision     General bed mobility comments: cues for safety, use of rail and pt able to transfer to EOB on his own   Transfers Overall transfer level: Needs assistance   Transfers: Sit to/from Stand Sit to Stand: Min assist;+2 safety/equipment         General transfer comment: cues for hand placement and safety from bed and chair  Ambulation/Gait Ambulation/Gait assistance: Min assist;+2 safety/equipment Ambulation Distance (Feet): 60 Feet Assistive device: Rolling walker (2 wheeled) Gait Pattern/deviations: Step-through pattern;Decreased stride length;Trunk flexed;Shuffle   Gait velocity interpretation: Below normal speed for age/gender General Gait Details: Cues to stand upright.  Patient with very flexed posture.  cues for position in RW and chair to follow for safety 2 trials of 75' with seated rest between   Stairs            Wheelchair Mobility    Modified Rankin (Stroke Patients Only)       Balance Overall balance assessment: Needs assistance   Sitting  balance-Leahy Scale: Fair       Standing balance-Leahy Scale: Poor                      Cognition Arousal/Alertness: Awake/alert Behavior During Therapy: Flat affect;Impulsive Overall Cognitive Status: No family/caregiver present to determine baseline cognitive functioning                      Exercises General Exercises - Lower Extremity Long Arc Quad: AAROM;Both;10 reps;Seated Hip Flexion/Marching: AAROM;Both;10 reps;Seated    General Comments        Pertinent Vitals/Pain Pain Assessment: No/denies pain    Home Living                      Prior Function            PT Goals (current goals can now be found in the care plan section) Progress towards PT goals: Progressing toward goals    Frequency       PT Plan Current plan remains appropriate    Co-evaluation             End of Session Equipment Utilized During Treatment: Gait belt Activity Tolerance: Patient tolerated treatment well Patient left: in chair;with call bell/phone within reach;with chair alarm set;with nursing/sitter in room     Time: 4098-1191 PT Time Calculation (min) (ACUTE ONLY): 18 min  Charges:  $Gait Training: 8-22 mins  G CodesToney Sang Beth 09/04/15, 11:09 AM Delaney Meigs, PT [phone]

## 2015-08-05 NOTE — Progress Notes (Signed)
[facility] PROGRESS NOTE  Jeff Foster ZOX:[id number] DOB: July 15, 1960 DOA: 07/18/2015 PCP: No primary care provider on file.  Assessment/Plan:  Encephalopathy - possibly secondary to UTI, meds. -now awake, but very aggressive, confused On PRN Haldol, got Iv ativan PRN x1 last night per staff which helped him - Head CT scan negative for acute findings. Normal ammonia.  - EEG negative for seizures, Urine Cx polymicorbial, Stop Rocephin  - Blood and uirne cx negative,  - Baseline mentation quite poor for 3 weeks, total care at group home per staff, 2plus assistance , unable to carry a conversation, per [name] at group home he is mostly confused, aggressive, and rarely appropriate - Depakote dose was reduced, stopped thorazine dose, due to concern for over medication - SLp eval and diet when mentation improves - Per Staff at Group home he came with diagnosis of Moderate MR, intermittent Explosive disorder, Paranoia, Schizophrnia, some signs of Dementia -Depakote to  QHS, add Risperidone, and Klonopin 0.5mg  BID. -Psychiatry re-consulted - CXR on 08/02/14 was negative for pneumonia   Dysphagia:  -Appreciate palliative care consultation, appears to be chronic dysphagia, family wants him to have thinly chopped food. -Has had a stroke in March 2015.   Sinus tachycardia, resolved. Dry mucous membranes, suspect dehyration. He is s/p 2 liters bolus in ED -Continue IVF  Hypertension. Controlled  Transaminitis.  No baseline LFTs to compare. Possibly secondary to medications.  -no abd symptoms, cmet in am  Hypothyroidism.  -continue home synthroid. -TSH WNL  GERD. -continue daily PPI  History of CVA per records brought in by Group Home.   Hypokalemia Replete with oral supplements, check BMP in a.m.  Thrombocytopenia, resolved Platelets are back to 185 Platelets was 96 on admission  Palliative care was consulted- recommendations patient is DO NOT  RESUSCITATE/DO NOT INTUBATE and no artificial feeding. Finely chopped moist diet with aspiration precautions  DVT prophylaxis Lovenox  Code Status: DNR Family Communication: no family at bedside Disposition Plan: Skilled nursing facility when bed available   Consultants:  Palliative care  Psychiatry  Procedures:  None  Antibiotics:  None  HPI/Subjective: 55 year old male with past history of moderate mental retardation and significant psychiatric history of intermittent Explosive disorder, Paranoia, Schizophrnia, h/o CVA, COPD, just recently discharged from hospital in [place] to Group Home in [place] around Christmas (12/24 or 12/16). At the group home pt was confused, somnolent, total care at group home per staff, requiring 2plus assistance , unable to carry a conversation, per [name] at group home he is mostly confused, aggressive, and rarely appropriate. He was brought to [facility] 1/13 due to worsening lethargy, behavioral issues. CT head unremarkable, UA was abnormal, has been on Abx, due to increased lethargy idoses of Thorazine and Depakote was reduced, since last evening waking up some, and very aggressive behavior, hitting staff. -Urine Cx and EEG negative  Patient seen and examined, no new complaints. Awaiting placement to skilled facility . Chest x-ray on 08/03/2015 was negative for pneumonia.  Objective: Filed Vitals:   08/05/15 0629 08/05/15 1356  BP: 112/66 128/79  Pulse: 62 78  Temp: 98.7 F (37.1 C) 97.7 F (36.5 C)  Resp: 20 20    Intake/Output Summary (Last 24 hours) at 08/05/15 1529 Last data filed at 08/05/15 1352  Gross per 24 hour  Intake   1620 ml  Output   1800 ml  Net   -180 ml   Filed Weights   08/03/15 0458 08/04/15 0444 08/05/15 0629  Weight: 88.76  kg (195 lb 10.9 oz) 86.229 kg (190 lb 1.6 oz) 85.684 kg (188 lb 14.4 oz)    Exam:   General:  Alert, oriented to self, no restraints  Cardiovascular: S1-S2  regular  Respiratory: Bilateral rhonchi  Abdomen: Soft, nontender, no organomegaly  Musculoskeletal: No cyanosis/clubbing/edema. Extremities   Data Reviewed: Basic Metabolic Panel:  Recent Labs Lab 07/31/15 0830  NA 141  K 3.7  CL 109  CO2 25  GLUCOSE 67  BUN <5*  CREATININE 0.58*  CALCIUM 8.9   Liver Function Tests: No results for input(s): AST, ALT, ALKPHOS, BILITOT, PROT, ALBUMIN in the last 168 hours.   Studies: No results found.  Scheduled Meds: . antiseptic oral rinse  7 mL Mouth Rinse q12n4p  . aspirin EC  81 mg Oral Daily  . atorvastatin  80 mg Oral Daily  . chlorhexidine  15 mL Mouth Rinse BID  . clonazePAM  0.5 mg Oral BID  . divalproex  1,500 mg Oral QHS  . enoxaparin (LOVENOX) injection  40 mg Subcutaneous Q24H  . famotidine  10 mg Oral Once  . levothyroxine  25 mcg Oral QAC breakfast  . OXcarbazepine  300 mg Oral BID  . pantoprazole  40 mg Oral Daily  . risperiDONE  1 mg Oral BID  . scopolamine  1 patch Transdermal Q72H  . tamsulosin  0.4 mg Oral QHS   Continuous Infusions: . sodium chloride 0.9 % 1,000 mL with potassium chloride 40 mEq infusion 75 mL/hr at 08/01/15 1144    Principal Problem:   Acute encephalopathy Active Problems:   UTI (lower urinary tract infection)   Cognitive decline   Mental retardation   Hypertension   History of CVA (cerebrovascular accident)   Encephalopathy acute   DNR (do not resuscitate)   Aspiration pneumonia [facility])   Palliative care encounter   Dysphagia   Altered mental status    Time spent: 25 min    [facility]  [facility] Pager [phone]. If 7PM-7AM, please contact night-coverage at [email or web address], password [facility] 08/05/2015, 3:29 PM  LOS: 17 days

## 2015-08-05 NOTE — Progress Notes (Addendum)
CSW was advised that patient was reviewed on yesterday by [employer] representative Nigel Berthold. CSW contact [facility] regarding [facility] status. [employer] is currently being reviewed by [facility]. CSW will contact Medical Director to provide update.   Fernande Boyden, LCSWA Clinical Social Worker [facility] Ph: [phone]
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
## 2015-08-05 NOTE — Progress Notes (Signed)
CSW received phone call from [place] at [facility] Rehab regarding pasarr number. CSW informed Mal Amabile that we are still awaiting pasarr number. CSW will continue to follow up with Mal Amabile regarding patient's status and pasarr number.   Fernande Boyden, LCSWA Clinical Social Worker [facility] Ph: [phone]

## 2015-08-06 NOTE — Progress Notes (Signed)
Pt had an episode of agitation and restlessness this pm. Was getting ready to administer prn geodon when pt calmed down. Pt resting calmly in bed at present

## 2015-08-06 NOTE — Progress Notes (Signed)
[facility] PROGRESS NOTE  Jeff Foster ZOX:[id number] DOB: 09-14-1960 DOA: 07/18/2015 PCP: No primary care provider on file.  Assessment/Plan:  Encephalopathy - possibly secondary to UTI, meds. -now awake, On PRN Haldol, got Iv ativan PRN x1 last night per staff which helped him - Head CT scan negative for acute findings. Normal ammonia.  - EEG negative for seizures, Urine Cx polymicorbial, Stop Rocephin  - Blood and uirne cx negative,  - Baseline mentation quite poor for 3 weeks, total care at group home per staff, 2plus assistance , unable to carry a conversation, per [name] at group home he is mostly confused, aggressive, and rarely appropriate - Depakote dose was reduced, stopped thorazine dose, due to concern for over medication - SLP eval and diet when mentation improves - Per Staff at Group home he came with diagnosis of Moderate MR, intermittent Explosive disorder, Paranoia, Schizophrenia, some signs of Dementia -Depakote to  QHS, add Risperidone, and Klonopin 0.5mg  BID. -Psychiatry re-consulted - CXR on 08/02/14 was negative for pneumonia  Dysphagia:  -Appreciate palliative care consultation, appears to be chronic dysphagia, family wants him to have thinly chopped food. -Has had a stroke in March 2015.   Sinus tachycardia, resolved. Dry mucous membranes, suspect dehyration. He is s/p 2 liters bolus in ED -Continue IVF  Hypertension. Controlled  Transaminitis.  No baseline LFTs to compare. Possibly secondary to medications.  -no abd symptoms, cmet in am  Hypothyroidism.  -continue home synthroid. -TSH WNL  GERD. -continue daily PPI  History of CVA per records brought in by Group Home.   Hypokalemia Replete with oral supplements, check BMP in a.m.  Thrombocytopenia, resolved Platelets are back to 185 Platelets was 96 on admission  Palliative care was consulted- recommendations patient is DO NOT RESUSCITATE/DO NOT INTUBATE and no  artificial feeding. Finely chopped moist diet with aspiration precautions  DVT prophylaxis Lovenox  Code Status: DNR Family Communication: no family at bedside Disposition Plan: Skilled nursing facility when bed available   Consultants:  Palliative care  Psychiatry  Procedures:  None  Antibiotics:  None  HPI/Subjective: 55 year old male with past history of moderate mental retardation and significant psychiatric history of intermittent Explosive disorder, Paranoia, Schizophrnia, h/o CVA, COPD, just recently discharged from hospital in [place] to Group Home in [place] around Christmas (12/24 or 12/16). At the group home pt was confused, somnolent, total care at group home per staff, requiring 2plus assistance , unable to carry a conversation, per [name] at group home he is mostly confused, aggressive, and rarely appropriate. He was brought to [facility] 1/13 due to worsening lethargy, behavioral issues. CT head unremarkable, UA was abnormal, has been on Abx, due to increased lethargy idoses of Thorazine and Depakote was reduced, since last evening waking up some, and very aggressive behavior, hitting staff. -Urine Cx and EEG negative  Patient seen and examined, no new complaints. Awaiting placement to skilled facility . Chest x-ray on 08/03/2015 was negative for pneumonia.  Objective: Filed Vitals:   08/05/15 2023 08/06/15 1424  BP: 114/66 114/78  Pulse: 80 78  Temp: 98.4 F (36.9 C) 98.4 F (36.9 C)  Resp: 18 18    Intake/Output Summary (Last 24 hours) at 08/06/15 1609 Last data filed at 08/06/15 1404  Gross per 24 hour  Intake    600 ml  Output    600 ml  Net      0 ml   Filed Weights   08/03/15 0458 08/04/15 0444 08/05/15 0629  Weight: 88.76  kg (195 lb 10.9 oz) 86.229 kg (190 lb 1.6 oz) 85.684 kg (188 lb 14.4 oz)    Exam:   General:  Alert, oriented to self, no restraints  Cardiovascular: S1-S2 regular  Respiratory: Bilateral  rhonchi  Abdomen: Soft, nontender, no organomegaly  Musculoskeletal: No cyanosis/clubbing/edema. Extremities   Data Reviewed: Basic Metabolic Panel:  Recent Labs Lab 07/31/15 0830  NA 141  K 3.7  CL 109  CO2 25  GLUCOSE 67  BUN <5*  CREATININE 0.58*  CALCIUM 8.9   Liver Function Tests: No results for input(s): AST, ALT, ALKPHOS, BILITOT, PROT, ALBUMIN in the last 168 hours.   Studies: No results found.  Scheduled Meds: . aspirin EC  81 mg Oral Daily  . atorvastatin  80 mg Oral Daily  . chlorhexidine  15 mL Mouth Rinse BID  . clonazePAM  0.5 mg Oral BID  . divalproex  1,500 mg Oral QHS  . enoxaparin (LOVENOX) injection  40 mg Subcutaneous Q24H  . famotidine  10 mg Oral Once  . levothyroxine  25 mcg Oral QAC breakfast  . OXcarbazepine  300 mg Oral BID  . pantoprazole  40 mg Oral Daily  . risperiDONE  1 mg Oral BID  . scopolamine  1 patch Transdermal Q72H  . tamsulosin  0.4 mg Oral QHS   Continuous Infusions: . sodium chloride 0.9 % 1,000 mL with potassium chloride 40 mEq infusion 75 mL/hr at 08/01/15 1144    Principal Problem:   Acute encephalopathy Active Problems:   UTI (lower urinary tract infection)   Cognitive decline   Mental retardation   Hypertension   History of CVA (cerebrovascular accident)   Encephalopathy acute   DNR (do not resuscitate)   Aspiration pneumonia [facility])   Palliative care encounter   Dysphagia   Altered mental status    Time spent: 25 min    Mahala Menghini [facility]  [facility] Pager [phone]. If 7PM-7AM, please contact night-coverage at [email or web address], password [facility] 08/06/2015, 4:09 PM  LOS: 18 days

## 2015-08-06 NOTE — Clinical Social Work Note (Addendum)
Spoke with MD regarding patient's discharge. Patient's information is still being reviewed for Level II PASRR. Once PASRR is received CSW can discharge the patient to [facility] and Rehab. CSW will continue to check for patient's approved PASRR throughout the day.   UPDATE 3:24PM: PASRR number has still not been received.    Roddie Mc MSW, [place], [place], [phone]

## 2015-08-07 NOTE — Progress Notes (Signed)
Physical Therapy Treatment Patient Details Name: Rollie Hynek MRN: [id number] DOB: 08/17/1960 Today's Date: 08/07/2015    History of Present Illness Pt admitted with acute encephalopathy. 55 y.o. male with mental retardation from group home with apparent progressive decline in function and diet tolerance    PT Comments    Pt with improved gait today and able to walk long hall distance. Pt eager for breakfast and denied further HEP or activity. Pt very pleasant but insistent on having someone stay with him and have a Coke end of session. Nursing in room with him end of session. Will continue to follow.   Follow Up Recommendations  SNF;Supervision/Assistance - 24 hour     Equipment Recommendations       Recommendations for Other Services       Precautions / Restrictions Precautions Precautions: Fall    Mobility  Bed Mobility Overal bed mobility: Needs Assistance Bed Mobility: Supine to Sit     Supine to sit: Min guard     General bed mobility comments: guarding for safety and cues, pt with use of rail for transfer  Transfers Overall transfer level: Needs assistance   Transfers: Sit to/from Stand Sit to Stand: Min assist;+2 safety/equipment         General transfer comment: cues for hand placement and safety from bed and chair  Ambulation/Gait Ambulation/Gait assistance: Min assist;+2 safety/equipment Ambulation Distance (Feet): 150 Feet Assistive device: Rolling walker (2 wheeled) Gait Pattern/deviations: Step-through pattern;Decreased stride length;Trunk flexed;Shuffle   Gait velocity interpretation: Below normal speed for age/gender General Gait Details: Cues to stand upright.  Patient with very flexed posture.  cues for position in RW and chair to follow for safety 2 trials of 150' with seated rest between. assist to direct RW   Stairs            Wheelchair Mobility    Modified Rankin (Stroke Patients Only)       Balance Overall balance  assessment: Needs assistance   Sitting balance-Leahy Scale: Fair Sitting balance - Comments: pt with improved anterior lean today     Standing balance-Leahy Scale: Poor                      Cognition Arousal/Alertness: Awake/alert Behavior During Therapy: Flat affect;Impulsive Overall Cognitive Status: No family/caregiver present to determine baseline cognitive functioning                      Exercises      General Comments        Pertinent Vitals/Pain Pain Assessment: No/denies pain    Home Living                      Prior Function            PT Goals (current goals can now be found in the care plan section) Progress towards PT goals: Progressing toward goals    Frequency       PT Plan Current plan remains appropriate    Co-evaluation             End of Session Equipment Utilized During Treatment: Gait belt Activity Tolerance: Patient tolerated treatment well Patient left: in chair;with call bell/phone within reach;with chair alarm set;with nursing/sitter in room     Time: 4098-1191 PT Time Calculation (min) (ACUTE ONLY): 15 min  Charges:  $Gait Training: 8-22 mins  G CodesDelorse Lek August 29, 2015, 8:55 AM Delaney Meigs, PT [phone]

## 2015-08-07 NOTE — Clinical Social Work Placement (Signed)
   CLINICAL SOCIAL WORK PLACEMENT  NOTE  Date:  08/07/2015  Patient Details  Name: Jeff Foster MRN: [id number] Date of Birth: 05/17/1961  Clinical Social Work is seeking post-discharge placement for this patient at the Skilled  Nursing Facility level of care (*CSW will initial, date and re-position this form in  chart as items are completed):  Yes   Patient/family provided with [facility] [place] Clinical Social Work Department's list of facilities offering this level of care within the geographic area requested by the patient (or if unable, by the patient's family).  Yes   Patient/family informed of their freedom to choose among providers that offer the needed level of care, that participate in Medicare, Medicaid or managed care program needed by the patient, have an available bed and are willing to accept the patient.      Patient/family informed of [facility]'s ownership interest in [facility] and [facility], as well as of the fact that they are under no obligation to receive care at these facilities.  PASRR submitted to EDS on 08/03/15     PASRR number received on 08/07/15     Existing PASRR number confirmed on       FL2 transmitted to all facilities in geographic area requested by pt/family on 08/04/15     FL2 transmitted to all facilities within larger geographic area on 08/04/15     Patient informed that his/her managed care company has contracts with or will negotiate with certain facilities, including the following:        Yes   Patient/family informed of bed offers received.  Patient chooses bed at  [facility])     Physician recommends and patient chooses bed at      Patient to be transferred to  [facility] and Rehab) on 08/04/15.  Patient to be transferred to facility by  [facility])     Patient family notified on 08/04/15 of transfer.  Name of family member notified:  Leeann Must (sister and guardian)  PHYSICIAN Please prepare priority  discharge summary, including medications, Please sign DNR     Additional Comment:  Per MD patient ready for DC to [facility]. RN, patient, patient's family, and facility notified of DC. RN given number for report. DC packet on chart. Ambulance transport requested for patient for next available pickup. [facility] states "it's going to be a long wait". CSW signing off.   _______________________________________________ Roddie Mc MSW, LCSW, [place], [phone]

## 2015-08-07 NOTE — Progress Notes (Signed)
Genene Churn to be D/C'd to [facility] per MD order. Discussed with the patient/family and all questions fully answered.  VSS.  IV catheter discontinued intact. Site without signs and symptoms of complications. Dressing and pressure applied. Foley catheter also discontinued.    SNF packet prepared by Child psychotherapist and sent with EMS. Packet includes prescriptions. Report called to RN at [facility]. Patient's sister is aware of transfer.  Patient to be escorted via stretcher, and D/C to [facility] via EMS.

## 2015-08-07 NOTE — Progress Notes (Signed)
Patient transferred to SNF via PTAR.  

## 2015-08-07 NOTE — Clinical Social Work Note (Signed)
CSW checked status of patient's PASRR. Patient's PASRR screening is still running. CSW will assist with discharging patient to [facility] Rehab once PASRR number is received.    Roddie Mc MSW, [place], [place], [phone]

## 2015-08-07 NOTE — Discharge Summary (Signed)
Physician Discharge Summary  Jeff Foster:[id number] DOB: 02-20-1961 DOA: 07/18/2015  PCP: No primary care provider on file.  Admit date: 07/18/2015 Discharge date: 08/07/2015  Time spent: 30 minutes  Recommendations for Outpatient Follow-up:  1. Follow up PCP in 2 weeks   Discharge Diagnoses:  Principal Problem:   Acute encephalopathy Active Problems:   UTI (lower urinary tract infection)   Cognitive decline   Mental retardation   Hypertension   History of CVA (cerebrovascular accident)   Encephalopathy acute   DNR (do not resuscitate)   Aspiration pneumonia (HCC)   Palliative care encounter   Dysphagia   Altered mental status   Discharge Condition: Stable  Diet recommendation: Dysphagia 3 diet with assist nectar thick   Filed Weights   08/04/15 0444 08/05/15 0629 08/07/15 0621  Weight: 86.229 kg (190 lb 1.6 oz) 85.684 kg (188 lb 14.4 oz) 85.369 kg (188 lb 3.3 oz)    History of present illness:  55 year old male with past history of moderate mental retardation and significant psychiatric history of intermittent Explosive disorder, Paranoia, Schizophrnia, h/o CVA, COPD, just recently discharged from hospital in [place] to Group Home in [place] around Christmas (12/24 or 12/16). At the group home pt was confused, somnolent, total care at group home per staff, requiring 2plus assistance , unable to carry a conversation, per [name] at group home he is mostly confused, aggressive, and rarely appropriate. He was brought to [facility] 1/13 due to worsening lethargy, behavioral issues. CT head unremarkable, UA was abnormal, has been on Abx, due to increased lethargy idoses of Thorazine and Depakote was reduced, since last evening waking up some, and very aggressive behavior, hitting staff. -Urine Cx and EEG negative  Hospital Course:  Encephalopathy Possibly secondary to UTI and medications. Now patient is awake, mildly confused. - Head CT scan negative for acute  findings. Normal ammonia.  - EEG negative for seizures, Urine Cx polymicorbial  - Blood and uirne cx negative,  - Baseline mentation quite poor for 3 weeks, total care at group home per staff, 2plus assistance , unable to carry a conversation, per [name] at group home he is mostly confused, aggressive, and rarely appropriate - Depakote dose was reduced, stopped thorazine dose, due to concern for over medication Speech therapy recommended dysphagia 3 diet - Per Staff at Group home he came with diagnosis of Moderate MR, intermittent Explosive disorder, Paranoia, Schizophrnia, some signs of Dementia -Psychiatry re-consulted -Depakote to  QHS, add Risperidone, and Klonopin 0.5mg  BID.  - CXR on 08/02/14 was negative for pneumonia  Dysphagia:  -Appreciate palliative care consultation, appears to be chronic dysphagia, family wants him to have thinly chopped food. Continue dysphagia 3 diet -Has had a stroke in March 2015.   Sinus tachycardia, resolved. Dry mucous membranes, suspect dehyration. He is s/p 2 liters bolus in ED  Hypertension. Controlled  Transaminitis. No baseline LFTs to compare. Possibly secondary to medications.  -no abdominal symptoms  Hypothyroidism.  -continue home synthroid. -TSH WNL  History of CVA per records brought in by Group Home.   Thrombocytopenia, resolved Platelets are back to 185 Platelets was 96 on admission  Procedures:  None   Consultations:  Psychiatry  Discharge Exam: Filed Vitals:   08/06/15 2051 08/07/15 1343  BP: 112/69 114/69  Pulse: 73 74  Temp:  98.2 F (36.8 C)  Resp: 17 18     Medication List    STOP taking these medications        chlorproMAZINE 100 MG  tablet  Commonly known as:  THORAZINE     LORazepam 1 MG tablet  Commonly known as:  ATIVAN      TAKE these medications        albuterol 108 (90 Base) MCG/ACT inhaler  Commonly known as:  PROVENTIL HFA;VENTOLIN HFA  Inhale 2 puffs into the  lungs every 6 (six) hours as needed for wheezing or shortness of breath.     aspirin EC 81 MG tablet  Take 81 mg by mouth daily.     atorvastatin 80 MG tablet  Commonly known as:  LIPITOR  Take 80 mg by mouth daily.     clonazePAM 0.5 MG tablet  Commonly known as:  KLONOPIN  Take 1 tablet (0.5 mg total) by mouth 2 (two) times daily.     divalproex 500 MG 24 hr tablet  Commonly known as:  DEPAKOTE ER  Take 3 tablets (1,500 mg total) by mouth at bedtime.     esomeprazole 40 MG capsule  Commonly known as:  NEXIUM  Take 40 mg by mouth daily at 12 noon.     food thickener Powd  Commonly known as:  THICK IT  Use as needed     levothyroxine 25 MCG tablet  Commonly known as:  SYNTHROID, LEVOTHROID  Take 25 mcg by mouth daily before breakfast.     Oxcarbazepine 300 MG tablet  Commonly known as:  TRILEPTAL  Take 1 tablet (300 mg total) by mouth 2 (two) times daily.     risperiDONE 1 MG tablet  Commonly known as:  RISPERDAL  Take 1 tablet (1 mg total) by mouth 2 (two) times daily.     scopolamine 1 MG/3DAYS  Commonly known as:  TRANSDERM-SCOP  Place 1 patch onto the skin every 3 (three) days.     tamsulosin 0.4 MG Caps capsule  Commonly known as:  FLOMAX  Take 0.4 mg by mouth at bedtime.     ziprasidone injection  Commonly known as:  GEODON  Inject 10 mg into the muscle every 2 (two) hours as needed for agitation (maximum two doses or 20 mg within 24 hours.).          Discharge Instructions   Discharge Instructions    Diet - low sodium heart healthy    Complete by:  As directed      Increase activity slowly    Complete by:  As directed              The results of significant diagnostics from this hospitalization (including imaging, microbiology, ancillary and laboratory) are listed below for reference.    Significant Diagnostic Studies: Dg Chest 2 View  08/03/2015  CLINICAL DATA:  Cough.  Evaluate for pneumonia. EXAM: CHEST  2 VIEW COMPARISON:  07/18/2015  FINDINGS: Mild peribronchial thickening. Heart and mediastinal contours are within normal limits. No focal opacities or effusions. No acute bony abnormality. IMPRESSION: Mild bronchitic changes. Electronically Signed   By: Charlett Nose M.D.   On: 08/03/2015 13:24   Ct Head Wo Contrast  07/18/2015  CLINICAL DATA:  Mental status changes. EXAM: CT HEAD WITHOUT CONTRAST TECHNIQUE: Contiguous axial images were obtained from the base of the skull through the vertex without intravenous contrast. COMPARISON:  None. FINDINGS: Age advanced cerebral atrophy, ventriculomegaly and periventricular white matter disease. No extra-axial fluid collections. No CT findings for hemispheric infarction an or intracranial hemorrhage. No mass lesions. The brainstem and cerebellum are grossly normal. No acute bony findings. No skull fracture or bone lesion.  Scattered mucoperiosteal thickening involving the ethmoid air cells and maxillary sinuses. No air-fluid levels. The mastoid air cells and middle ear cavities are clear. The globes are intact. IMPRESSION: Age advanced cerebral atrophy, ventriculomegaly and periventricular white matter disease. No acute intracranial findings or skull fracture. Electronically Signed   By: Rudie Meyer M.D.   On: 07/18/2015 13:43   Dg Chest Port 1 View  07/19/2015  CLINICAL DATA:  Acute onset of leukocytosis.  Initial encounter. EXAM: PORTABLE CHEST 1 VIEW COMPARISON:  None. FINDINGS: The lungs are well-aerated. Mild right perihilar opacity could reflect pneumonia, given the patient's leukocytosis. There is no evidence of pleural effusion or pneumothorax. The cardiomediastinal silhouette is within normal limits. No acute osseous abnormalities are seen. IMPRESSION: Mild right perihilar opacity could reflect pneumonia, given the patient's leukocytosis. Would correlate with the patient's symptoms. If the patient has symptoms of pneumonia, follow-up PA and lateral chest X-ray is recommended in 3-4 weeks  following trial of antibiotic therapy to ensure resolution and exclude underlying malignancy. Otherwise, further evaluation is recommended. Electronically Signed   By: Roanna Raider M.D.   On: 07/19/2015 02:00   Dg Humerus Left  07/18/2015  CLINICAL DATA:  Deformity, weakness, altered mental status. EXAM: LEFT HUMERUS - 2+ VIEW COMPARISON:  None. FINDINGS: Examination demonstrates chronic bony remodeling over the distal diametaphyseal region of the left humerus compatible with an old fracture site. No acute fracture or dislocation. Mild degenerative change of the [facility] joint. IV tubing over the left antecubital region. IMPRESSION: No acute findings. Old distal left humeral fracture. Electronically Signed   By: Elberta Fortis M.D.   On: 07/18/2015 14:24   Dg Swallowing Func-speech Pathology  07/24/2015  Objective Swallowing Evaluation:   Patient Details Name: Jeff Foster MRN: [id number] Date of Birth: May 20, 1961 Today's Date: 07/24/2015 Time: SLP Start Time (ACUTE ONLY): 1422-SLP Stop Time (ACUTE ONLY): 1440 SLP Time Calculation (min) (ACUTE ONLY): 18 min Past Medical History: No past medical history on file. Past Surgical History: No past surgical history on file. HPI: Pt admitted with acute encephalopathy. 55 y.o. male with mental retardation from group home with apparent progressive decline in function and diet tolerance. CXR 1/13 concerning for PNA. Subjective: pt lethargic Assessment / Plan / Recommendation CHL IP CLINICAL IMPRESSIONS 07/24/2015 Therapy Diagnosis Severe oral phase dysphagia;Severe pharyngeal phase dysphagia Clinical Impression Pt has a severe oropharyngeal dysphagia with limited assessment. He has weak and ineffective lingual manipulation for containment of thin liquids, resulting in almost all liquids spilling anteriorly from his oral cavity. Puree boluses have poor cohesion, but with Max cues from SLP and assistance from RN, small amounts of puree are posteriorly transited to the pharynx. It  takes Max cues to elicit a pharyngeal swallow response with puree sitting in the valleculae during significant delay. When he does ultimately swallow, he does not appear to have any appreciable transfer from the pharynx to the esophagus, with the entire bolus staying in the valleculae, base of tongue, and oral cavity. Delayed coughing not captured on camera is concerning for aspiration after the swallow on residue. Additional boluses were attempted after completion of testing (please see treatment note for further details), but NPO status is recommended at this time due to severe aspiration risk. Impact on safety and function Severe aspiration risk;Risk for inadequate nutrition/hydration   CHL IP TREATMENT RECOMMENDATION 07/24/2015 Treatment Recommendations Therapy as outlined in treatment plan below   Prognosis 07/24/2015 Prognosis for Safe Diet Advancement Guarded Barriers to Reach Goals Other (Comment) Barriers/Prognosis Comment --  CHL IP DIET RECOMMENDATION 07/24/2015 SLP Diet Recommendations NPO Liquid Administration via -- Medication Administration Via alternative means Compensations -- Postural Changes --   CHL IP OTHER RECOMMENDATIONS 07/24/2015 Recommended Consults -- Oral Care Recommendations Oral care QID Other Recommendations --   CHL IP FOLLOW UP RECOMMENDATIONS 07/24/2015 Follow up Recommendations (No Data)   CHL IP FREQUENCY AND DURATION 07/24/2015 Speech Therapy Frequency (ACUTE ONLY) min 2x/week Treatment Duration 2 weeks      CHL IP ORAL PHASE 07/24/2015 Oral Phase Impaired Oral - Pudding Teaspoon -- Oral - Pudding Cup -- Oral - Honey Teaspoon -- Oral - Honey Cup -- Oral - Nectar Teaspoon -- Oral - Nectar Cup -- Oral - Nectar Straw -- Oral - Thin Teaspoon Weak lingual manipulation;Reduced posterior propulsion;Left anterior bolus loss;Right anterior bolus loss;Decreased bolus cohesion;Lingual/palatal residue Oral - Thin Cup -- Oral - Thin Straw -- Oral - Puree Weak lingual manipulation;Reduced posterior  propulsion;Left anterior bolus loss;Right anterior bolus loss;Decreased bolus cohesion;Lingual/palatal residue;Delayed oral transit Oral - Mech Soft -- Oral - Regular -- Oral - Multi-Consistency -- Oral - Pill -- Oral Phase - Comment --  CHL IP PHARYNGEAL PHASE 07/24/2015 Pharyngeal Phase Impaired Pharyngeal- Pudding Teaspoon -- Pharyngeal -- Pharyngeal- Pudding Cup -- Pharyngeal -- Pharyngeal- Honey Teaspoon -- Pharyngeal -- Pharyngeal- Honey Cup -- Pharyngeal -- Pharyngeal- Nectar Teaspoon -- Pharyngeal -- Pharyngeal- Nectar Cup -- Pharyngeal -- Pharyngeal- Nectar Straw -- Pharyngeal -- Pharyngeal- Thin Teaspoon NT Pharyngeal -- Pharyngeal- Thin Cup -- Pharyngeal -- Pharyngeal- Thin Straw -- Pharyngeal -- Pharyngeal- Puree Delayed swallow initiation-vallecula;Reduced pharyngeal peristalsis;Reduced epiglottic inversion;Reduced anterior laryngeal mobility;Reduced laryngeal elevation;Reduced airway/laryngeal closure;Reduced tongue base retraction;Pharyngeal residue - valleculae Pharyngeal -- Pharyngeal- Mechanical Soft -- Pharyngeal -- Pharyngeal- Regular -- Pharyngeal -- Pharyngeal- Multi-consistency -- Pharyngeal -- Pharyngeal- Pill -- Pharyngeal -- Pharyngeal Comment --  CHL IP CERVICAL ESOPHAGEAL PHASE 07/24/2015 Cervical Esophageal Phase (No Data) Pudding Teaspoon -- Pudding Cup -- Honey Teaspoon -- Honey Cup -- Nectar Teaspoon -- Nectar Cup -- Nectar Straw -- Thin Teaspoon -- Thin Cup -- Thin Straw -- Puree -- Mechanical Soft -- Regular -- Multi-consistency -- Pill -- Cervical Esophageal Comment -- No flowsheet data found. Maxcine Ham, M.A. CCC-SLP [phone] Maxcine Ham 07/24/2015, 3:53 PM               Microbiology: No results found for this or any previous visit (from the past 240 hour(s)).   Labs: Basic Metabolic Panel: No results for input(s): NA, K, CL, CO2, GLUCOSE, BUN, CREATININE, CALCIUM, MG, PHOS in the last 168 hours.  No results for input(s): WBC, NEUTROABS, HGB, HCT, MCV, PLT  in the last 168 hours.     SignedRhetta Mura MD.  [facility] 08/07/2015, 1:53 PM

## 2015-10-27 ENCOUNTER — Encounter (HOSPITAL_COMMUNITY): Payer: Self-pay

## 2015-10-27 ENCOUNTER — Inpatient Hospital Stay (HOSPITAL_COMMUNITY): Payer: Medicare Other

## 2015-10-27 ENCOUNTER — Emergency Department (HOSPITAL_COMMUNITY): Payer: Medicare Other

## 2015-10-27 ENCOUNTER — Inpatient Hospital Stay (HOSPITAL_COMMUNITY)
Admission: EM | Admit: 2015-10-27 | Discharge: 2015-10-28 | DRG: 100 | Disposition: A | Discharge status: Skilled Nursing Facility | Payer: Medicare Other | Attending: Internal Medicine | Admitting: Internal Medicine

## 2015-10-27 DIAGNOSIS — E039 Hypothyroidism, unspecified: Secondary | ICD-10-CM | POA: Diagnosis present

## 2015-10-27 DIAGNOSIS — G40409 Other generalized epilepsy and epileptic syndromes, not intractable, without status epilepticus: Secondary | ICD-10-CM | POA: Diagnosis not present

## 2015-10-27 DIAGNOSIS — Z91010 Allergy to peanuts: Secondary | ICD-10-CM

## 2015-10-27 DIAGNOSIS — J690 Pneumonitis due to inhalation of food and vomit: Secondary | ICD-10-CM | POA: Diagnosis present

## 2015-10-27 DIAGNOSIS — F79 Unspecified intellectual disabilities: Secondary | ICD-10-CM | POA: Diagnosis present

## 2015-10-27 DIAGNOSIS — D696 Thrombocytopenia, unspecified: Secondary | ICD-10-CM | POA: Diagnosis present

## 2015-10-27 DIAGNOSIS — R569 Unspecified convulsions: Secondary | ICD-10-CM

## 2015-10-27 DIAGNOSIS — Z7982 Long term (current) use of aspirin: Secondary | ICD-10-CM

## 2015-10-27 DIAGNOSIS — E038 Other specified hypothyroidism: Secondary | ICD-10-CM | POA: Diagnosis not present

## 2015-10-27 DIAGNOSIS — D649 Anemia, unspecified: Secondary | ICD-10-CM | POA: Diagnosis present

## 2015-10-27 DIAGNOSIS — Z79899 Other long term (current) drug therapy: Secondary | ICD-10-CM | POA: Diagnosis not present

## 2015-10-27 DIAGNOSIS — Z888 Allergy status to other drugs, medicaments and biological substances status: Secondary | ICD-10-CM | POA: Diagnosis not present

## 2015-10-27 DIAGNOSIS — G9340 Encephalopathy, unspecified: Secondary | ICD-10-CM

## 2015-10-27 DIAGNOSIS — I10 Essential (primary) hypertension: Secondary | ICD-10-CM | POA: Diagnosis present

## 2015-10-27 DIAGNOSIS — Z8673 Personal history of transient ischemic attack (TIA), and cerebral infarction without residual deficits: Secondary | ICD-10-CM | POA: Diagnosis not present

## 2015-10-27 DIAGNOSIS — Z66 Do not resuscitate: Secondary | ICD-10-CM | POA: Diagnosis present

## 2015-10-27 DIAGNOSIS — F919 Conduct disorder, unspecified: Secondary | ICD-10-CM | POA: Diagnosis present

## 2015-10-27 DIAGNOSIS — F05 Delirium due to known physiological condition: Secondary | ICD-10-CM | POA: Diagnosis present

## 2015-10-27 DIAGNOSIS — M795 Residual foreign body in soft tissue: Secondary | ICD-10-CM

## 2015-10-27 DIAGNOSIS — F29 Unspecified psychosis not due to a substance or known physiological condition: Secondary | ICD-10-CM | POA: Diagnosis present

## 2015-10-27 DIAGNOSIS — J9601 Acute respiratory failure with hypoxia: Secondary | ICD-10-CM | POA: Diagnosis present

## 2015-10-27 HISTORY — DX: Unspecified convulsions: R56.9

## 2015-10-27 HISTORY — DX: Cerebral infarction, unspecified: I63.9

## 2015-10-27 LAB — URINALYSIS, ROUTINE W REFLEX MICROSCOPIC
BILIRUBIN URINE: NEGATIVE
Glucose, UA: NEGATIVE mg/dL
Ketones, ur: NEGATIVE mg/dL
Leukocytes, UA: NEGATIVE
Nitrite: NEGATIVE
PH: 6 (ref 5.0–8.0)
Protein, ur: 30 mg/dL — AB
SPECIFIC GRAVITY, URINE: 1.014 (ref 1.005–1.030)

## 2015-10-27 LAB — COMPREHENSIVE METABOLIC PANEL
ALBUMIN: 3.5 g/dL (ref 3.5–5.0)
ALT: 13 U/L — AB (ref 17–63)
AST: 19 U/L (ref 15–41)
Alkaline Phosphatase: 68 U/L (ref 38–126)
Anion gap: 12 (ref 5–15)
BILIRUBIN TOTAL: 0.4 mg/dL (ref 0.3–1.2)
BUN: 10 mg/dL (ref 6–20)
CO2: 22 mmol/L (ref 22–32)
Calcium: 8.8 mg/dL — ABNORMAL LOW (ref 8.9–10.3)
Chloride: 101 mmol/L (ref 101–111)
Creatinine, Ser: 0.75 mg/dL (ref 0.61–1.24)
GFR calc Af Amer: >60 mL/min (ref >60)
GLUCOSE: 106 mg/dL — AB (ref 65–99)
POTASSIUM: 4.2 mmol/L (ref 3.5–5.1)
Sodium: 135 mmol/L (ref 135–145)
TOTAL PROTEIN: 6.7 g/dL (ref 6.5–8.1)

## 2015-10-27 LAB — I-STAT CG4 LACTIC ACID, ED
LACTIC ACID, VENOUS: 1.11 mmol/L (ref 0.5–2.0)
Lactic Acid, Venous: 2.73 mmol/L (ref 0.5–2.0)

## 2015-10-27 LAB — CBC WITH DIFFERENTIAL/PLATELET
BASOS ABS: 0 10*3/uL (ref 0.0–0.1)
BASOS PCT: 0 %
Eosinophils Absolute: 0.2 10*3/uL (ref 0.0–0.7)
Eosinophils Relative: 3 %
HEMATOCRIT: 38.7 % — AB (ref 39.0–52.0)
Hemoglobin: 12.7 g/dL — ABNORMAL LOW (ref 13.0–17.0)
LYMPHS PCT: 19 %
Lymphs Abs: 1.5 10*3/uL (ref 0.7–4.0)
MCH: 29.1 pg (ref 26.0–34.0)
MCHC: 32.8 g/dL (ref 30.0–36.0)
MCV: 88.8 fL (ref 78.0–100.0)
MONO ABS: 1 10*3/uL (ref 0.1–1.0)
Monocytes Relative: 12 %
NEUTROS ABS: 5.2 10*3/uL (ref 1.7–7.7)
NEUTROS PCT: 66 %
Platelets: 89 10*3/uL — ABNORMAL LOW (ref 150–400)
RBC: 4.36 MIL/uL (ref 4.22–5.81)
RDW: 13.6 % (ref 11.5–15.5)
WBC: 8 10*3/uL (ref 4.0–10.5)

## 2015-10-27 LAB — URINE MICROSCOPIC-ADD ON: BACTERIA UA: NONE SEEN

## 2015-10-27 LAB — I-STAT TROPONIN, ED: TROPONIN I, POC: 0 ng/mL (ref 0.00–0.08)

## 2015-10-27 LAB — TSH: TSH: 4.79 u[IU]/mL — AB (ref 0.350–4.500)

## 2015-10-27 LAB — VALPROIC ACID LEVEL: VALPROIC ACID LVL: 52 ug/mL (ref 50.0–100.0)

## 2015-10-27 MED ORDER — VANCOMYCIN HCL IN DEXTROSE 750-5 MG/150ML-% IV SOLN
750.0000 mg | Freq: Three times a day (TID) | INTRAVENOUS | Status: DC
  Filled 2015-10-27: qty 150

## 2015-10-27 MED ORDER — SODIUM CHLORIDE 0.9 % IV SOLN
500.0000 mg | Freq: Two times a day (BID) | INTRAVENOUS | Status: DC
  Filled 2015-10-27 (×2): qty 5

## 2015-10-27 MED ORDER — LEVOTHYROXINE SODIUM 25 MCG PO TABS
25.0000 ug | ORAL_TABLET | Freq: Every day | ORAL | Status: DC
Start: 2015-10-28 — End: 2015-10-28
  Administered 2015-10-28: 25 ug via ORAL
  Filled 2015-10-27 (×2): qty 1

## 2015-10-27 MED ORDER — TAMSULOSIN HCL 0.4 MG PO CAPS: 0.4000 mg | ORAL_CAPSULE | Freq: Every day | ORAL | Status: DC

## 2015-10-27 MED ORDER — CLONAZEPAM 0.5 MG PO TABS
0.5000 mg | ORAL_TABLET | Freq: Two times a day (BID) | ORAL | Status: DC
  Administered 2015-10-28: 0.5 mg via ORAL
  Filled 2015-10-27: qty 1

## 2015-10-27 MED ORDER — VALPROATE SODIUM 500 MG/5ML IV SOLN
500.0000 mg | Freq: Three times a day (TID) | INTRAVENOUS | Status: DC
  Administered 2015-10-28: 500 mg via INTRAVENOUS
  Filled 2015-10-27 (×4): qty 5

## 2015-10-27 MED ORDER — IPRATROPIUM-ALBUTEROL 0.5-2.5 (3) MG/3ML IN SOLN
3.0000 mL | Freq: Once | RESPIRATORY_TRACT | Status: AC
  Administered 2015-10-27: 3 mL via RESPIRATORY_TRACT
  Filled 2015-10-27: qty 3

## 2015-10-27 MED ORDER — OXCARBAZEPINE 300 MG PO TABS
300.0000 mg | ORAL_TABLET | Freq: Two times a day (BID) | ORAL | Status: DC
  Administered 2015-10-28: 300 mg via ORAL
  Filled 2015-10-27 (×2): qty 1

## 2015-10-27 MED ORDER — VALPROATE SODIUM 500 MG/5ML IV SOLN
1000.0000 mg | Freq: Once | INTRAVENOUS | Status: AC
  Administered 2015-10-27: 1000 mg via INTRAVENOUS
  Filled 2015-10-27: qty 10

## 2015-10-27 MED ORDER — PIPERACILLIN-TAZOBACTAM 3.375 G IVPB 30 MIN
3.3750 g | Freq: Once | INTRAVENOUS | Status: AC
  Administered 2015-10-27: 3.375 g via INTRAVENOUS
  Filled 2015-10-27: qty 50

## 2015-10-27 MED ORDER — SODIUM CHLORIDE 0.9 % IV SOLN: 1.5000 g | Freq: Once | INTRAVENOUS | Status: DC

## 2015-10-27 MED ORDER — SODIUM CHLORIDE 0.9 % IV SOLN
1000.0000 mg | INTRAVENOUS | Status: AC
  Administered 2015-10-27: 1000 mg via INTRAVENOUS
  Filled 2015-10-27: qty 10

## 2015-10-27 MED ORDER — HALOPERIDOL LACTATE 5 MG/ML IJ SOLN
2.0000 mg | Freq: Four times a day (QID) | INTRAMUSCULAR | Status: DC | PRN
  Administered 2015-10-27: 2 mg via INTRAVENOUS
  Filled 2015-10-27: qty 1

## 2015-10-27 MED ORDER — PIPERACILLIN-TAZOBACTAM 3.375 G IVPB: 3.3750 g | Freq: Three times a day (TID) | INTRAVENOUS | Status: DC

## 2015-10-27 MED ORDER — VANCOMYCIN HCL 10 G IV SOLR
1500.0000 mg | Freq: Once | INTRAVENOUS | Status: AC
  Administered 2015-10-27: 1500 mg via INTRAVENOUS
  Filled 2015-10-27: qty 1500

## 2015-10-27 MED ORDER — RISPERIDONE 1 MG PO TABS
1.0000 mg | ORAL_TABLET | Freq: Two times a day (BID) | ORAL | Status: DC
  Administered 2015-10-28: 1 mg via ORAL
  Filled 2015-10-27 (×2): qty 1

## 2015-10-27 MED ORDER — IPRATROPIUM-ALBUTEROL 0.5-2.5 (3) MG/3ML IN SOLN: 3.0000 mL | RESPIRATORY_TRACT | Status: DC | PRN

## 2015-10-27 MED ORDER — SODIUM CHLORIDE 0.9 % IV SOLN
3.0000 g | Freq: Three times a day (TID) | INTRAVENOUS | Status: DC
  Administered 2015-10-28 (×3): 3 g via INTRAVENOUS
  Filled 2015-10-27 (×5): qty 3

## 2015-10-27 MED ORDER — PANTOPRAZOLE SODIUM 40 MG PO TBEC
40.0000 mg | DELAYED_RELEASE_TABLET | Freq: Every day | ORAL | Status: DC
Start: 2015-10-27 — End: 2015-10-28
  Administered 2015-10-28: 40 mg via ORAL
  Filled 2015-10-27: qty 1

## 2015-10-27 MED ORDER — HALOPERIDOL LACTATE 5 MG/ML IJ SOLN: 3.0000 mg | Freq: Once | INTRAMUSCULAR | Status: DC

## 2015-10-27 MED ORDER — SCOPOLAMINE 1 MG/3DAYS TD PT72
1.0000 | MEDICATED_PATCH | TRANSDERMAL | Status: DC
  Administered 2015-10-28: 1.5 mg via TRANSDERMAL
  Filled 2015-10-27 (×2): qty 1

## 2015-10-27 MED ORDER — LORAZEPAM 2 MG/ML IJ SOLN
1.0000 mg | Freq: Once | INTRAMUSCULAR | Status: AC
  Administered 2015-10-27: 1 mg via INTRAVENOUS
  Filled 2015-10-27: qty 1

## 2015-10-27 MED ORDER — ZIPRASIDONE MESYLATE 20 MG IM SOLR
10.0000 mg | INTRAMUSCULAR | Status: DC | PRN
  Filled 2015-10-27: qty 20

## 2015-10-27 MED ORDER — SODIUM CHLORIDE 0.9 % IV BOLUS (SEPSIS)
1000.0000 mL | Freq: Once | INTRAVENOUS | Status: AC
  Administered 2015-10-27: 1000 mL via INTRAVENOUS

## 2015-10-27 MED ORDER — ATORVASTATIN CALCIUM 80 MG PO TABS
80.0000 mg | ORAL_TABLET | Freq: Every day | ORAL | Status: DC
  Administered 2015-10-28: 80 mg via ORAL
  Filled 2015-10-27: qty 1

## 2015-10-27 MED ORDER — ASPIRIN EC 81 MG PO TBEC
81.0000 mg | DELAYED_RELEASE_TABLET | Freq: Every day | ORAL | Status: DC
  Administered 2015-10-28: 81 mg via ORAL
  Filled 2015-10-27: qty 1

## 2015-10-27 MED ORDER — SODIUM CHLORIDE 0.9 % IV SOLN
INTRAVENOUS | Status: AC
  Administered 2015-10-28: 06:00:00 via INTRAVENOUS

## 2015-10-27 NOTE — ED Notes (Signed)
Per Jeff Foster, assisted family living caregiver, pt is verbal, ambulatory and self-care for ADL's. Caregiver reports managing meds and doing cooking for regular diet.  Caregiver reports no hx dysphagia.

## 2015-10-27 NOTE — ED Notes (Signed)
MD Isaacs at the bedside  

## 2015-10-27 NOTE — ED Notes (Signed)
CBG 112.  

## 2015-10-27 NOTE — ED Notes (Signed)
MD Erma HeritageIsaacs and Silverio LayYao at the bedside. Pt following commands, but is still post-ictal. Pt has tongue outside of mouth and breathing with crackles. Pt continues to try and roll to the left side. Bilateral arms pulled to abdomen. Able to release right arm with some assistance. Pt caregiver at the bedside and states that baseline is normal verbal and cognition.

## 2015-10-27 NOTE — H&P (Signed)
History and Physical    Jeff ChurnJohn Bratton ZOX:[id number] DOB: [birth year] DOA: 10/27/2015  Referring Provider: EDP PCP: No primary care provider on file.  Outpatient Specialists:  None listed  Patient coming from: Group home   Chief Complaint: Seizure  HPI: Jeff Foster is a 55 y.o. male with medical history significant for seizures, developmental delay, psychosis, hypothyroidism, chronic anemia, and chronic thrombocytopenia who presents to the ED from his group home following an episode with seizure like activity and subsequent difficulty arousing him. Patient was apparently in his usual state of health when just prior to arrival, he was noted to have a staring spell followed by convulsions involving the body and extremities. He was difficult to arouse afterwards and was brought into the ED for evaluation. There had been no recent illness or recent change in his medications reported. There is been no recent trauma. There has been no known use of alcohol or illicit drugs.  ED Course: Upon arrival to the ED, patient is found to be afebrile, saturating poorly on room air, but with vital signs otherwise stable. CMP is unremarkable and CBC features a hemoglobin of 12.7 and platelet count of 89,000. Troponin is undetectable, urine is grossly negative for infection, and lactic acid is elevated to 2.73. Head CT is negative for acute intracranial abnormality. Chest x-ray demonstrates opacities in the right lower and left mid-upper lung fields. Patient was placed on nonrebreather with recovery of his O2 sats to the 90s. Blood and urine cultures were obtained and empiric vancomycin and Zosyn were administered for suspected healthcare associated pneumonia. Neurology was consulted by the EDP and recommends brain MRI and Keppra load. Patient has what appears to be a prolonged postictal state rather than being in status. Patient is remained hemodynamically stable in the emergency department and will be admitted for ongoing  evaluation and management of suspected seizure with prolonged postictal state and acute hypoxic respiratory failure suspected secondary to an aspiration pneumonia.  Review of Systems:  All other systems reviewed and apart from HPI, are negative.  Past Medical History  Diagnosis Date  . Seizure (HCC)   . 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 No pertinent past surgical history.   has no tobacco, alcohol, and drug history on file.  Allergies  Allergen Reactions  . Other Other (See Comments)    clorox bleach  . Peanut-Containing Drug Products     History reviewed. No pertinent family history.   Prior to Admission medications   Medication Sig Start Date End Date Taking? Authorizing Provider  albuterol (PROVENTIL HFA;VENTOLIN HFA) 108 (90 Base) MCG/ACT inhaler Inhale 2 puffs into the lungs every 6 (six) hours as needed for wheezing or shortness of breath.   Yes Historical Provider, MD  aspirin EC 81 MG tablet Take 81 mg by mouth daily.   Yes Historical Provider, MD  atorvastatin (LIPITOR) 80 MG tablet Take 80 mg by mouth daily.   Yes Historical Provider, MD  clonazePAM (KLONOPIN) 0.5 MG tablet Take 1 tablet (0.5 mg total) by mouth 2 (two) times daily. 08/04/15  Yes Meredeth IdeGagan S Lama, MD  divalproex (DEPAKOTE ER) 500 MG 24 hr tablet Take 3 tablets (1,500 mg total) by mouth at bedtime. 08/04/15  Yes Meredeth IdeGagan S Lama, MD  esomeprazole (NEXIUM) 40 MG capsule Take 40 mg by mouth daily at 12 noon.   Yes Historical Provider, MD  food thickener (THICK IT) POWD Use as needed 08/04/15  Yes Meredeth IdeGagan S Lama, MD  levothyroxine (SYNTHROID, LEVOTHROID) 25 MCG  tablet Take 25 mcg by mouth daily before breakfast.   Yes Historical Provider, MD  Oxcarbazepine (TRILEPTAL) 300 MG tablet Take 1 tablet (300 mg total) by mouth 2 (two) times daily. 08/04/15  Yes Meredeth Ide, MD  risperiDONE (RISPERDAL) 1 MG tablet Take 1 tablet (1 mg total) by mouth 2 (two) times daily. 08/04/15  Yes Meredeth Ide, MD  scopolamine  (TRANSDERM-SCOP) 1 MG/3DAYS Place 1 patch onto the skin every 3 (three) days.   Yes Historical Provider, MD  tamsulosin (FLOMAX) 0.4 MG CAPS capsule Take 0.4 mg by mouth at bedtime.   Yes Historical Provider, MD  ziprasidone (GEODON) injection Inject 10 mg into the muscle every 2 (two) hours as needed for agitation (maximum two doses or 20 mg within 24 hours.). 08/04/15  Yes Meredeth Ide, MD    Physical Exam: Filed Vitals:   10/27/15 1833 10/27/15 1845 10/27/15 1900 10/27/15 2000  BP:  103/65  167/84  Pulse:  55 62 91  Temp:      TempSrc:      Resp:  SpO2: 100% 100% 100% 97%      Constitutional: NAD, calm, comfortable Eyes: PERTLA, lids and conjunctivae normal ENMT: Mucous membranes are moist. Posterior pharynx clear of any exudate or lesions.   Neck: normal, supple, no masses, no thyromegaly Respiratory: Diminished b/l, coarse rhonchi b/l  Cardiovascular: S1 & S2 heard, regular rate and rhythm, no murmurs / rubs / gallops. No extremity edema. 2+ pedal pulses.   Abdomen: No distension, no tenderness, no masses palpated. Bowel sounds normal.  Musculoskeletal: no clubbing / cyanosis. No joint deformity upper and lower extremities. Normal muscle tone.  Skin: no rashes, lesions, ulcers. No induration Neurologic: CN 2-12 grossly intact. Sensation intact, DTR normal. Strength 5/5 in all 4 limbs.  Psychiatric: Normal mood and affect.      Labs on Admission: I have personally reviewed following labs and imaging studies  CBC:  Recent Labs Lab 10/27/15 1918  WBC 8.0  NEUTROABS 5.2  HGB 12.7*  HCT 38.7*  MCV 88.8  PLT 89*   Basic Metabolic Panel:  Recent Labs Lab 10/27/15 1918  NA 135  K 4.2  CL 101  CO2 22  GLUCOSE 106*  BUN 10  CREATININE 0.75  CALCIUM 8.8*   GFR: CrCl cannot be calculated (Unknown ideal weight.). Liver Function Tests:  Recent Labs Lab 10/27/15 1918  AST 19  ALT 13*  ALKPHOS 68  BILITOT 0.4  PROT 6.7  ALBUMIN 3.5   No results  for input(s): LIPASE, AMYLASE in the last 168 hours. No results for input(s): AMMONIA in the last 168 hours. Coagulation Profile: No results for input(s): INR, PROTIME in the last 168 hours. Cardiac Enzymes: No results for input(s): CKTOTAL, CKMB, CKMBINDEX, TROPONINI in the last 168 hours. BNP (last 3 results) No results for input(s): PROBNP in the last 8760 hours. HbA1C: No results for input(s): HGBA1C in the last 72 hours. CBG: No results for input(s): GLUCAP in the last 168 hours. Lipid Profile: No results for input(s): CHOL, HDL, LDLCALC, TRIG, CHOLHDL, LDLDIRECT in the last 72 hours. Thyroid Function Tests:  Recent Labs  10/27/15 1918  TSH 4.790*   Anemia Panel: No results for input(s): VITAMINB12, FOLATE, FERRITIN, TIBC, IRON, RETICCTPCT in the last 72 hours. Urine analysis:    Component Value Date/Time   COLORURINE YELLOW 10/27/2015 2007   APPEARANCEUR CLEAR 10/27/2015 2007   LABSPEC 1.014 10/27/2015 2007   PHURINE 6.0 10/27/2015 2007  GLUCOSEU NEGATIVE 10/27/2015 2007   HGBUR TRACE* 10/27/2015 2007   BILIRUBINUR NEGATIVE 10/27/2015 2007   KETONESUR NEGATIVE 10/27/2015 2007   PROTEINUR 30* 10/27/2015 2007   NITRITE NEGATIVE 10/27/2015 2007   LEUKOCYTESUR NEGATIVE 10/27/2015 2007   Sepsis Labs: @LABRCNTIP (procalcitonin:4,lacticidven:4) )No results found for this or any previous visit (from the past 240 hour(s)).   Radiological Exams on Admission: Dg Abd 1 View  10/27/2015  CLINICAL DATA:  Preprocedural evaluation prior to MRI. EXAM: ABDOMEN - 1 VIEW COMPARISON:  None. FINDINGS: Stool is present throughout the colon. Nonobstructed bowel gas pattern. Supine evaluation limited for the detection of free intraperitoneal air. Lumbar spine degenerative changes. IMPRESSION: Stool throughout the colon as can be seen with constipation. Electronically Signed   By: Annia Belt M.D.   On: 10/27/2015 21:39   Ct Head Wo Contrast  10/27/2015  CLINICAL DATA:  Altered mental  status.  Seizure. EXAM: CT HEAD WITHOUT CONTRAST TECHNIQUE: Contiguous axial images were obtained from the base of the skull through the vertex without intravenous contrast. COMPARISON:  07/18/2015 FINDINGS: Re- demonstrated age advanced atrophy with centralized volume loss and commensurate ex vacuo dilatation of the ventricular system. Scattered periventricular hypodensities compatible with microvascular ischemic disease. Given background parenchymal abnormalities, there is no CT evidence of superimposed acute large territory infarct. No intraparenchymal or extra-axial mass or hemorrhage. Unchanged size and configuration of the ventricles and basilar cisterns. No midline shift. There is scattered opacification involving the right anterior and the left anterior and posterior ethmoidal air cells. Small air-fluid levels noted within the left sphenoid sinus. There is a minimal amount of mucosal thickening within the bilateral maxillary sinuses. The remaining paranasal sinuses and mastoid air cells are normally aerated. No air-fluid levels. Regional soft tissues appear normal. No displaced calvarial fracture. Suspected old right nasal bone fracture, unchanged. IMPRESSION:: IMPRESSION: 1. 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 2. Sinus disease including small air-fluid level within left sphenoid sinus as above. Electronically Signed   By: Simonne Come M.D.   On: 10/27/2015 17:54   Dg Chest Port 1 View  10/27/2015  CLINICAL DATA:  Altered mental status.  Recent seizure. EXAM: PORTABLE CHEST 1 VIEW COMPARISON:  Chest radiograph 08/03/2015 FINDINGS: Limited rotated portable chest radiograph with multiple overlying monitoring leads. Grossly stable cardiac and mediastinal contours. Suggestion of patchy airspace opacities throughout the left mid and upper lung and right lower lung. No large pleural effusion or pneumothorax. IMPRESSION: Limited rotated portable  radiograph. Suggestion of opacities within the right lower lung and left mid and upper lung which are nonspecific may be secondary to atelectasis, aspiration or infection. Recommend correlation with repeat PA and lateral chest radiograph when patient clinically able. Electronically Signed   By: Annia Belt M.D.   On: 10/27/2015 18:13    EKG: Independently reviewed. Sinus rhythm, RBBB, LaFB, no significant change from prior  Assessment/Plan  1. Acute hypoxic respiratory failure secondary to aspiration pneumonia  - Satting 85% on rm air, recovered well with NRB at 15 Lpm, plan to wean down FiO2 as tolerated   - CXR with multifocal opacities and history is consistent with aspiration  - Blood cultures are incubating, will follow  - Received empiric vancomycin and Zosyn in ED, will continue with Unasyn only for now  - Initial lactate 2.73; giving IVF, will trend; check procalcitonin - Continuous pulse oximetry with titration of FiO2 to maintain sat >92%    2. Seizure disorder with breakthrough seizure  -  Head CT negative for acute intracranial abnormality  - Managed with Depakote and Trileptal at home, levels pending  - Neurology is consulting and much appreciated  - Keppra loaded in ED, will continue with 500 mg BID  - Continue Depakote, Trileptal  - Seizure precautions   3. Acute encephalopathy  - Likely secondary to seizure with prolonged post-ictal state  - Improving in ED  - Seen by neuro; MRI ordered to exclude other etiologies    4. Anemia, thrombocytopenia  - Hgb 12.7, platelets 89,000 on admission  - Suspected secondary to antiepileptics  - Both indices stable relative to prior measurements  - No sign of active blood loss   5. Developmental delay with psychosis  - Appears stable  - Continue home-dose haloperidol BID with prn Geodon   6. Hypothyroidism  - TSH 4.790 on admission  - Check free T4 - Continue current-dose Synthroid 25 mcg for now    DVT prophylaxis: sq  Lovenox Code Status: DNR  Family Communication: None available  Disposition Plan: admit to stepdown  Consults called: None  Admission status: Inpatient    Briscoe Deutscher MD [facility] Pager [phone]  If 7PM-7AM, please contact night-coverage [email or web address] Password [facility]  10/27/2015, 10:03 PM

## 2015-10-27 NOTE — ED Notes (Signed)
Upon entering room at shift change, pt very agitated stating he had to pee. Attempted several interventions and ended up in and out cathing pt. Pt has not settled down and is laying in bed. All fall risk interventions are in place. Per nurse that gave report, pt has been post ictal/non verbal his entire time here and this was the first time pt had spoken.

## 2015-10-27 NOTE — ED Notes (Signed)
hospitalist at the bedside 

## 2015-10-27 NOTE — ED Notes (Signed)
Pt has coarse crackles noted. RT called to assess patient for aspiration. MD called.

## 2015-10-27 NOTE — Consult Note (Signed)
Neurology Consultation Reason for Consult: Seizures Referring Physician: Silverio Lay, D  CC: Seizures  History is obtained from: Caregiver  HPI: Jeff Foster is a 55 y.o. male lives in a group home with a caregiver but is able to take care of most of his own activities of daily living. His caregiver states that all he really does for him is [employer]. He was in his normal state of health earlier, when he was seen to stare off blankly. His caregiver then lowered into a chair at which time he became stiff and started shaking all over. This lasted for a couple of minutes. EMS was called. He was apparently following some commands on initial arrival. He continues to be postictal.   ROS:  Unable to obtain due to altered mental status.   Past Medical History  Diagnosis Date  . Seizure (HCC)   . Stroke (cerebrum) (HCC)      No family history on file. And unable to obtain further given altered mental status   Social History:  has no tobacco, alcohol, and drug history on file. And unable to obtain further due to altered mental status  Exam: Current vital signs: BP 103/65 mmHg  Pulse 62  Temp(Src) 97.6 F (36.4 C) (Rectal)  Resp 14  SpO2 100% Vital signs in last 24 hours: Temp:  [97.6 F (36.4 C)] 97.6 F (36.4 C) (04/24 1710) Pulse Rate:  [55-87] 62 (04/24 1900) Resp:  [14-38] 14 (04/24 1900) BP: (103-148)/(60-82) 103/65 mmHg (04/24 1845) SpO2:  [86 %-100 %] 100 % (04/24 1900)   Physical Exam  Constitutional: Appears well-developed and well-nourished.  Psych: Affect appropriate to situation Eyes: No scleral injection HENT: No OP obstrucion Head: Normocephalic.  Cardiovascular: Normal rate and regular rhythm.  Respiratory: Effort normal and breath sounds normal to anterior ascultation GI: Soft.  No distension. There is no tenderness.  Skin: WDI  Neuro: Mental Status: Patient is lethargic, but does open eyes to noxious stimulus. He does not follow commands, but is purposeful with  bilateral arms. Cranial Nerves: II: He does not clearly blink to threat from either direction Pupils are equal, round, and reactive to light.   III,IV, VI: He has slight disconjugate gaze with the left eye hourly deviated compared to the right V: VII: He blinks to eyelid stimulation bilaterally Motor: He moves all extremities with good strength, he has purposeful volitional movements bilaterally. Sensory: He withdraws to noxious stimulation bilaterally Cerebellar: He does not perform  He has a coarse tremor of the arm and jaw which I believe is tardive in nature.       I have reviewed labs in epic and the results pertinent to this consultation are: Oxcarbazepine and Depakote levels pending CBC-unremarkable other than some thrombocytopenia, BMP pending  I have reviewed the images obtained: CT head-no acute findings  Impression: 55 year old male with breakthrough seizure. It is unclear if he has had any issues with compliance.  Following levels, however, further adjustment of his medications may be necessary. I suspect that he continues to be postictal, prolonged postictal periods can happen in the developmentally delayed. With purposeful bilateral movements, I have low suspicion for ongoing seizure. His disconjugate gaze is more concerning, and an MRI to rule out other possible etiologies is necessary.  Recommendations: 1) restart Trileptal once able to take by mouth 2) Keppra IV until able to take Trileptal 3) Depacon IV pending ability to  take by mouth, I will give him an additional dose of 1000 mg now 4) EEG 5)  MRI brain 6) we will follow   Ritta SlotMcNeill Preethi Scantlebury, MD [facility] [phone]  If 7pm- 7am, please page neurology on call as listed in AMION.

## 2015-10-27 NOTE — ED Provider Notes (Signed)
CSN: [id number]     Arrival date & time 10/27/15  1702 History   First MD Initiated Contact with Patient 10/27/15 1715     Chief Complaint  Patient presents with  . Seizures     (Consider location/radiation/quality/duration/timing/severity/associated sxs/prior Treatment) HPI  55 year old male with past medical history of seizure disorder, mental retardation, and history of behavioral disturbances who presents with confusion after seizure-like activity. Per report from the patient's facility, the patient had a witnessed generalized tonic-clonic seizure lasting several minutes. Since then, he has been sonorous and confused. He is intermittently confused at baseline but this is significantly increased and he is normally talkative. Patient lives in a facility and they deny any recent illnesses or fevers. No recent medication changes. Patient has been adherent with his medications. On my assessment, the patient is confused and unable to provide further history.  Per review of records, the patient was admitted in January for aspiration pneumonia. At that time, palliative care was consult it and the patient's sister, who is the healthcare power of attorney, made the patient DO NOT RESUSCITATE/DO NOT RESUSCITATE.   Past Medical History  Diagnosis Date  . Seizure (HCC)   . Stroke (cerebrum) (HCC)    No past surgical history on file. No family history on file. Social History  Substance Use Topics  . Smoking status: None  . Smokeless tobacco: None  . Alcohol Use: None    Review of Systems  Unable to perform ROS: Mental status change  Psychiatric/Behavioral: Positive for confusion.      Allergies  Other and Peanut-containing drug products  Home Medications   Prior to Admission medications   Medication Sig Start Date End Date Taking? Authorizing Provider  albuterol (PROVENTIL HFA;VENTOLIN HFA) 108 (90 Base) MCG/ACT inhaler Inhale 2 puffs into the lungs every 6 (six) hours as needed for  wheezing or shortness of breath.    Historical Provider, MD  aspirin EC 81 MG tablet Take 81 mg by mouth daily.    Historical Provider, MD  atorvastatin (LIPITOR) 80 MG tablet Take 80 mg by mouth daily.    Historical Provider, MD  clonazePAM (KLONOPIN) 0.5 MG tablet Take 1 tablet (0.5 mg total) by mouth 2 (two) times daily. 08/04/15   Meredeth Ide, MD  divalproex (DEPAKOTE ER) 500 MG 24 hr tablet Take 3 tablets (1,500 mg total) by mouth at bedtime. 08/04/15   Meredeth Ide, MD  esomeprazole (NEXIUM) 40 MG capsule Take 40 mg by mouth daily at 12 noon.    Historical Provider, MD  food thickener (THICK IT) POWD Use as needed 08/04/15   Meredeth Ide, MD  levothyroxine (SYNTHROID, LEVOTHROID) 25 MCG tablet Take 25 mcg by mouth daily before breakfast.    Historical Provider, MD  Oxcarbazepine (TRILEPTAL) 300 MG tablet Take 1 tablet (300 mg total) by mouth 2 (two) times daily. 08/04/15   Meredeth Ide, MD  risperiDONE (RISPERDAL) 1 MG tablet Take 1 tablet (1 mg total) by mouth 2 (two) times daily. 08/04/15   Meredeth Ide, MD  scopolamine (TRANSDERM-SCOP) 1 MG/3DAYS Place 1 patch onto the skin every 3 (three) days.    Historical Provider, MD  tamsulosin (FLOMAX) 0.4 MG CAPS capsule Take 0.4 mg by mouth at bedtime.    Historical Provider, MD  ziprasidone (GEODON) injection Inject 10 mg into the muscle every 2 (two) hours as needed for agitation (maximum two doses or 20 mg within 24 hours.). 08/04/15   Meredeth Ide, MD  BP 148/82 mmHg  Pulse 57  Temp(Src) 97.6 F (36.4 C) (Rectal)  Resp 26  SpO2 100% Physical Exam  Constitutional:  Markedly disoriented, with sonorous respirations. Transmitted upper airway sounds. Intermittently follows commands.  HENT:  Head: Normocephalic.  Mouth/Throat: No oropharyngeal exudate.  Eyes: Conjunctivae are normal. Pupils are equal, round, and reactive to light.  Neck: Normal range of motion. Neck supple.  Cardiovascular: Normal rate, regular rhythm, normal heart sounds and  intact distal pulses.  Exam reveals no friction rub.   No murmur heard. Pulmonary/Chest: Effort normal. No respiratory distress. He has no wheezes. He has rales.  Bibasilar rales and rhonchi with coarse breath sounds. Transmitted upper airway sounds.  Abdominal: Soft. Bowel sounds are normal. He exhibits no distension. There is no tenderness.  Musculoskeletal: He exhibits no edema.  Neurological:  Drowsy but intermittently follows commands with bilateral upper and lower extremities. Temperature reading with intermittent fasciculations but no rhythm like abnormal movements. Pupils are reactive bilaterally. He does have an intact gag and cough reflex. No pooling of secretions.  Skin: Skin is warm. No rash noted.  Nursing note and vitals reviewed.   ED Course  Procedures (including critical care time) Labs Review Labs Reviewed  CBC WITH DIFFERENTIAL/PLATELET - Abnormal; Notable for the following:    Hemoglobin 12.7 (*)    HCT 38.7 (*)    Platelets 89 (*)    All other components within normal limits  COMPREHENSIVE METABOLIC PANEL - Abnormal; Notable for the following:    Glucose, Bld 106 (*)    Calcium 8.8 (*)    ALT 13 (*)    All other components within normal limits  TSH - Abnormal; Notable for the following:    TSH 4.790 (*)    All other components within normal limits  URINALYSIS, ROUTINE W REFLEX MICROSCOPIC (NOT AT [facility]) - Abnormal; Notable for the following:    Hgb urine dipstick TRACE (*)    Protein, ur 30 (*)    All other components within normal limits  URINE MICROSCOPIC-ADD ON - Abnormal; Notable for the following:    Squamous Epithelial / LPF 0-5 (*)    Casts HYALINE CASTS (*)    All other components within normal limits  I-STAT CG4 LACTIC ACID, ED - Abnormal; Notable for the following:    Lactic Acid, Venous 2.73 (*)    All other components within normal limits  CULTURE, BLOOD (ROUTINE X 2)  CULTURE, BLOOD (ROUTINE X 2)  URINE CULTURE  CULTURE, EXPECTORATED  SPUTUM-ASSESSMENT  GRAM STAIN  VALPROIC ACID LEVEL  10-HYDROXYCARBAZEPINE  HIV ANTIBODY (ROUTINE TESTING)  STREP PNEUMONIAE URINARY ANTIGEN  PROCALCITONIN  BASIC METABOLIC PANEL  CBC WITH DIFFERENTIAL/PLATELET  LACTIC ACID, PLASMA  LACTIC ACID, PLASMA  T4, FREE  I-STAT TROPOININ, ED  CBG MONITORING, ED  I-STAT CG4 LACTIC ACID, ED    Imaging Review Ct Head Wo Contrast  10/27/2015  CLINICAL DATA:  Altered mental status.  Seizure. EXAM: CT HEAD WITHOUT CONTRAST TECHNIQUE: Contiguous axial images were obtained from the base of the skull through the vertex without intravenous contrast. COMPARISON:  07/18/2015 FINDINGS: Re- demonstrated age advanced atrophy with centralized volume loss and commensurate ex vacuo dilatation of the ventricular system. Scattered periventricular hypodensities compatible with microvascular ischemic disease. Given background parenchymal abnormalities, there is no CT evidence of superimposed acute large territory infarct. No intraparenchymal or extra-axial mass or hemorrhage. Unchanged size and configuration of the ventricles and basilar cisterns. No midline shift. There is scattered opacification involving the right anterior  and the left anterior and posterior ethmoidal air cells. Small air-fluid levels noted within the left sphenoid sinus. There is a minimal amount of mucosal thickening within the bilateral maxillary sinuses. The remaining paranasal sinuses and mastoid air cells are normally aerated. No air-fluid levels. Regional soft tissues appear normal. No displaced calvarial fracture. Suspected old right nasal bone fracture, unchanged. IMPRESSION:: IMPRESSION: 1. 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 2. Sinus disease including small air-fluid level within left sphenoid sinus as above. Electronically Signed   By: Simonne ComeJohn  Watts M.D.   On: 10/27/2015 17:54   Dg Chest Port 1 View  10/27/2015  CLINICAL DATA:   Altered mental status.  Recent seizure. EXAM: PORTABLE CHEST 1 VIEW COMPARISON:  Chest radiograph 08/03/2015 FINDINGS: Limited rotated portable chest radiograph with multiple overlying monitoring leads. Grossly stable cardiac and mediastinal contours. Suggestion of patchy airspace opacities throughout the left mid and upper lung and right lower lung. No large pleural effusion or pneumothorax. IMPRESSION: Limited rotated portable radiograph. Suggestion of opacities within the right lower lung and left mid and upper lung which are nonspecific may be secondary to atelectasis, aspiration or infection. Recommend correlation with repeat PA and lateral chest radiograph when patient clinically able. Electronically Signed   By: Annia Beltrew  Davis M.D.   On: 10/27/2015 18:13   I have personally reviewed and evaluated these images and lab results as part of my medical decision-making.   EKG Interpretation   Date/Time:  Monday October 27 2015 17:27:07 EDT Ventricular Rate:  83 PR Interval:  172 QRS Duration: 146 QT Interval:  419 QTC Calculation: 492 R Axis:   -55 Text Interpretation:  Sinus rhythm RBBB and LAFB Artifact in lead(s) I V1  V2 No significant change since last tracing Confirmed by YAO  MD, DAVID  ([id number]) on 10/27/2015 5:55:01 PM      MDM   55 yo M with PMHx of cognitive impairment, CVA, seizure disorder who p/w persistent AMS after witnesses GTC seizure. On arrival, pt remains post-ictal, confused but protecting airway. Blood glucose >100. No focal neuro deficits on initial exam. No fever or signs of sepsis, though pt is tacyhpneic and mildly hypoxic - suspect aspiration, likely 2/2 seizure. Per review of records, pt has h/o recurrent aspiration .Will treat empirically, obtain CT Head, broad labs to evaluate for possible infectious or metabolic trigger. Pt has been adherent with meds as he is in facility.  Labs, imaging as above. CBC with no leukocytosis. CMP at baseline. LA mildly elevated at 2.7,  likely 2/2 recent seizure. EKG non-ischemic. CT Head negative. CXR c/f aspiration, for which pt has been covered. Attempted to contact pt's HCPOA without success. Per review of records, pt is DNR/DNI but would like other treatment. Will consult Neurology for reccs re: prolonged post-ictal period, possible intermittent seizure-like activity  Neuro has evaluated. Do not suspect ongoing seizure activity. Will treat and admit to Medicine. HCPOA again unable to be reached. Pt otherwise HDS.  Clinical Impression: 1. Post-ictal confusion   2. Seizure (HCC)   4. Other specified hypothyroidism   5. Thrombocytopenia (HCC)   6. Acute respiratory failure with hypoxia (HCC)   7. History of CVA (cerebrovascular accident)   8. Essential hypertension   9. Mental retardation   10. Acute encephalopathy     Disposition: Admit  Condition: Stable  Pt seen in conjunction with Dr. Hilarie FredricksonYao   Tamitha Norell, MD 10/28/15 0149  Richardean Canalavid H Yao, MD 10/30/15 [phone]

## 2015-10-27 NOTE — ED Notes (Signed)
Patient arrived to CT 

## 2015-10-27 NOTE — Progress Notes (Addendum)
Pharmacy Antibiotic Note  Jeff Foster is a 55 y.o. male admitted on 10/27/2015 with pneumonia and sepsis.  Pharmacy has been consulted for vancomycin and zosyn dosing. Pt is afebrile and WBC is WNL. Lactic acid is elevated at 2.73.   Plan: - Vancomycin 1500mg  IV x 1 then 750mg  IV Q8H - Zosyn 3.375gm IV Q8H (4 hr inf) - F/u renal fxn, C&S, clinical status and trough at SS     Temp (24hrs), Avg:97.6 F (36.4 C), Min:97.6 F (36.4 C), Max:97.6 F (36.4 C)   Recent Labs Lab 10/27/15 1916 10/27/15 1918  WBC  --  8.0  CREATININE  --  0.75  LATICACIDVEN 2.73*  --     CrCl cannot be calculated (Unknown ideal weight.).    Allergies  Allergen Reactions  . Other Other (See Comments)    clorox bleach  . Peanut-Containing Drug Products     Antimicrobials this admission: Vanc 4/24>> Zosyn 4/24>>  Dose adjustments this admission: N/A  Microbiology results: Pending  Thank you for allowing pharmacy to be a part of this patient's care.  Naji Mehringer, Drake LeachRachel Lynn 10/27/2015 8:33 PM   Addendum: Now changing to unasyn. Unasyn 3gm IV Q8H  Lysle Pearlachel Marjean Imperato, PharmD, BCPS Pager # [phone] 10/27/2015 10:08 PM

## 2015-10-27 NOTE — ED Notes (Signed)
Pt taken to CT.

## 2015-10-27 NOTE — ED Notes (Signed)
Dr. Kirkpatrick MD at bedside. 

## 2015-10-27 NOTE — ED Notes (Signed)
Per EMS, PT is coming from home. Pt has Hx of Stroke and Seizures with abnormal gait at baseline, roommate is unknown about specifics. Speech is normal at baseline. At 1605, Pt had a seizure that lasted a couple minutes with a blank stare that led then into seizure like activity. No injury noted. Pt is currently post-ictal with GCS 10. 4L [place] 97%, 114/70, 119 CBG, 75 HR with RBBB.

## 2015-10-27 NOTE — ED Notes (Signed)
Called CT for Stat CT

## 2015-10-27 NOTE — ED Notes (Signed)
Pt returned to room. Xray at the bedside

## 2015-10-28 ENCOUNTER — Encounter (HOSPITAL_COMMUNITY): Payer: Self-pay | Admitting: *Deleted

## 2015-10-28 DIAGNOSIS — D649 Anemia, unspecified: Secondary | ICD-10-CM

## 2015-10-28 DIAGNOSIS — I10 Essential (primary) hypertension: Secondary | ICD-10-CM

## 2015-10-28 DIAGNOSIS — E038 Other specified hypothyroidism: Secondary | ICD-10-CM

## 2015-10-28 DIAGNOSIS — F05 Delirium due to known physiological condition: Secondary | ICD-10-CM

## 2015-10-28 DIAGNOSIS — F79 Unspecified intellectual disabilities: Secondary | ICD-10-CM

## 2015-10-28 DIAGNOSIS — Z8673 Personal history of transient ischemic attack (TIA), and cerebral infarction without residual deficits: Secondary | ICD-10-CM

## 2015-10-28 DIAGNOSIS — R569 Unspecified convulsions: Secondary | ICD-10-CM

## 2015-10-28 DIAGNOSIS — J9601 Acute respiratory failure with hypoxia: Secondary | ICD-10-CM

## 2015-10-28 DIAGNOSIS — G9340 Encephalopathy, unspecified: Secondary | ICD-10-CM

## 2015-10-28 DIAGNOSIS — D696 Thrombocytopenia, unspecified: Secondary | ICD-10-CM

## 2015-10-28 LAB — CBC WITH DIFFERENTIAL/PLATELET
BASOS PCT: 0 %
Basophils Absolute: 0 10*3/uL (ref 0.0–0.1)
Eosinophils Absolute: 0 10*3/uL (ref 0.0–0.7)
Eosinophils Relative: 0 %
HEMATOCRIT: 35 % — AB (ref 39.0–52.0)
HEMOGLOBIN: 11.7 g/dL — AB (ref 13.0–17.0)
LYMPHS ABS: 1.4 10*3/uL (ref 0.7–4.0)
LYMPHS PCT: 13 %
MCH: 29.5 pg (ref 26.0–34.0)
MCHC: 33.4 g/dL (ref 30.0–36.0)
MCV: 88.4 fL (ref 78.0–100.0)
MONO ABS: 1.4 10*3/uL — AB (ref 0.1–1.0)
MONOS PCT: 13 %
NEUTROS ABS: 7.7 10*3/uL (ref 1.7–7.7)
NEUTROS PCT: 74 %
Platelets: 98 10*3/uL — ABNORMAL LOW (ref 150–400)
RBC: 3.96 MIL/uL — ABNORMAL LOW (ref 4.22–5.81)
RDW: 13.7 % (ref 11.5–15.5)
WBC: 10.5 10*3/uL (ref 4.0–10.5)

## 2015-10-28 LAB — BASIC METABOLIC PANEL
ANION GAP: 8 (ref 5–15)
BUN: 6 mg/dL (ref 6–20)
CHLORIDE: 103 mmol/L (ref 101–111)
CO2: 26 mmol/L (ref 22–32)
Calcium: 8.7 mg/dL — ABNORMAL LOW (ref 8.9–10.3)
Creatinine, Ser: 0.71 mg/dL (ref 0.61–1.24)
GFR calc non Af Amer: >60 mL/min (ref >60)
GLUCOSE: 111 mg/dL — AB (ref 65–99)
POTASSIUM: 4 mmol/L (ref 3.5–5.1)
Sodium: 137 mmol/L (ref 135–145)

## 2015-10-28 LAB — PROCALCITONIN: Procalcitonin: <0.1 ng/mL

## 2015-10-28 LAB — HIV ANTIBODY (ROUTINE TESTING W REFLEX): HIV Screen 4th Generation wRfx: NONREACTIVE

## 2015-10-28 LAB — 10-HYDROXYCARBAZEPINE: TRILIPTAL/MTB(OXCARBAZEPIN): NOT DETECTED ug/mL (ref 10–35)

## 2015-10-28 LAB — T4, FREE: FREE T4: 1.09 ng/dL (ref 0.61–1.12)

## 2015-10-28 LAB — LACTIC ACID, PLASMA: Lactic Acid, Venous: 0.7 mmol/L (ref 0.5–2.0)

## 2015-10-28 LAB — GLUCOSE, CAPILLARY: GLUCOSE-CAPILLARY: 112 mg/dL — AB (ref 65–99)

## 2015-10-28 LAB — MRSA PCR SCREENING: MRSA by PCR: NEGATIVE

## 2015-10-28 MED ORDER — SODIUM CHLORIDE 0.9 % IV BOLUS (SEPSIS): 1500.0000 mL | Freq: Once | INTRAVENOUS | Status: DC

## 2015-10-28 MED ORDER — DIVALPROEX SODIUM ER 500 MG PO TB24: 500.0000 mg | ORAL_TABLET | Freq: Every morning | ORAL | Status: DC

## 2015-10-28 MED ORDER — DIVALPROEX SODIUM ER 500 MG PO TB24: 1500.0000 mg | ORAL_TABLET | Freq: Every day | ORAL | Status: DC

## 2015-10-28 MED ORDER — STARCH (THICKENING) PO POWD
ORAL | Status: DC | PRN
  Filled 2015-10-28: qty 227

## 2015-10-28 MED ORDER — DIVALPROEX SODIUM ER 500 MG PO TB24
500.0000 mg | ORAL_TABLET | Freq: Every day | ORAL | Status: DC
  Filled 2015-10-28: qty 1

## 2015-10-28 MED ORDER — RESOURCE THICKENUP CLEAR PO POWD
ORAL | Status: DC | PRN
  Filled 2015-10-28: qty 125

## 2015-10-28 MED ORDER — DIVALPROEX SODIUM ER 500 MG PO TB24
1500.0000 mg | ORAL_TABLET | Freq: Every day | ORAL | Status: DC
  Filled 2015-10-28: qty 3

## 2015-10-28 NOTE — Progress Notes (Signed)
Discharge instructions reviewed with Casimiro NeedleMichael, pt's caregiver.  New dosages reviewed with Casimiro NeedleMichael.  New dosages also highlighted on discharge instructions.  Casimiro NeedleMichael verbalizes understanding of discharge instructions.  Dr. Joseph ArtWoods notified that pt needs new rx for depakote.  Discharged via wheelchair to Casimiro NeedleMichael (caregiver).

## 2015-10-28 NOTE — Progress Notes (Signed)
Subjective: No further seizures, much improved.   Exam: Filed Vitals:   10/28/15 0600 10/28/15 0740  BP: 107/67   Pulse: 66   Temp:  97.9 F (36.6 C)  Resp: 14    Gen: In bed, NAD Resp: non-labored breathing, no acute distress Abd: soft, nt  Neuro: MS: awake, alert.  ZO:XWRUECN:PERRL, eyes dysconjugate(patient staets this is baseline). Tongue and mouth tremor, suspect tardive Motor: MAEW, coarse left arm tremor Sensory:reports reduced sensation in his right arm, but no   Pertinent Labs: VPA 52  Impression: 55 yo M with breakthrough seizure. His VPA level was low normal and there is room to increase this. Oxcarbazepine level still pending.  Recommendations: 1) increase depakote ER to 500mg  qma and 1500mg  qpm 2) continue oxcarbazepine 300mg  BID 3) Follow up with outpatient  4) no need for EEG given return for baseline, neurology will sign off.   Ritta SlotMcNeill Kirstina Leinweber, MD [facility] [phone]  If 7pm- 7am, please page neurology on call as listed in AMION.

## 2015-10-28 NOTE — Discharge Summary (Addendum)
Physician Discharge Summary  Jeff Foster WUJ:[id number] DOB: 28-Aug-1960 DOA: 10/27/2015  PCP: No primary care provider on file.  Admit date: 10/27/2015 Discharge date: 10/28/2015  Time spent: 30 minutes  Recommendations for Outpatient Follow-up:  Acute hypoxic respiratory failure  - Satting 85% on rm air. Most likely secondary to patient's seizure. Patient with negative leukocytosis, negative fever, negative left shift (to support diagnosis of aspiration pneumonia). Will DC ANTIBIOTICS discharged home.  -Follow-up with PCP in one week  Seizure disorder with breakthrough seizure  - Head CT negative for acute intracranial abnormality  - increase depakote ER to  qma and  qpm -continue oxcarbazepine  BID -Follow up with outpatient  -no need for EEG given return for baseline, neurology will sign off.   Acute encephalopathy  -Resolved   Anemia, thrombocytopenia  - Hgb 12.7, platelets 89,000 on admission  - Suspected secondary to antiepileptics  - Both indices stable relative to prior measurements  - No sign of active blood loss   Developmental delay with psychosis  - Appears stable  - Continue home medication.   Hypothyroidism  - TSH 4.790 on admission  - Free T4 WNL, PCP may need to increase patient's Synthroid slightly.      Discharge Diagnoses:  Principal Problem:   Acute respiratory failure with hypoxia (HCC) Active Problems:   Hypertension   Aspiration pneumonia (HCC)   Post-ictal confusion   Hypothyroidism   Thrombocytopenia (HCC)   Seizure (HCC)   Psychosis   Normocytic anemia   Essential hypertension   Discharge Condition: Stable  Diet recommendation: Regular honey thick liquid  Filed Weights   10/28/15 0545  Weight: 90.7 kg (199 lb 15.3 oz)    History of present illness:  Jeff Foster is a 55 y.o MR . WM PMHx Seizures, Developmental Delay, Psychosis, Hypothyroidism, chronic anemia, and chronic thrombocytopenia   Who  presents to the ED from his group home following an episode with seizure like activity and subsequent difficulty arousing him. Patient was apparently in his usual state of health when just prior to arrival, he was noted to have a staring spell followed by convulsions involving the body and extremities. He was difficult to arouse afterwards and was brought into the ED for evaluation. There had been no recent illness or recent change in his medications reported. There is been no recent trauma. There has been no known use of alcohol or illicit drugs.  ED Course: Upon arrival to the ED, patient is found to be afebrile, saturating poorly on room air, but with vital signs otherwise stable. CMP is unremarkable and CBC features a hemoglobin of 12.7 and platelet count of 89,000. Troponin is undetectable, urine is grossly negative for infection, and lactic acid is elevated to 2.73. Head CT is negative for acute intracranial abnormality. Chest x-ray demonstrates opacities in the right lower and left mid-upper lung fields. Patient was placed on nonrebreather with recovery of his O2 sats to the 90s. Blood and urine cultures were obtained and empiric vancomycin and Zosyn were administered for suspected healthcare associated pneumonia. Neurology was consulted by the EDP and recommends brain MRI and Keppra load. Patient has what appears to be a prolonged postictal state rather than being in status. Patient is remained hemodynamically stable in the emergency department and will be admitted for ongoing evaluation and management of suspected seizure with prolonged postictal state and acute hypoxic respiratory failure suspected secondary to an aspiration pneumonia. Patient evaluated by neuro hospitalist and medication adjusted. Neuro hospitalist has signed off and  okay patient for discharge.   Procedures: 4/24 Head CT is negative for acute intracranial abnormality 4/24 PCXR; opacities within the RLL and LUL; nonspecific may be  secondary to atelectasis vs aspiration vs infection 4/24 MRI brain; negative acute intracranial process-Small remote lacunar infarcts within the bilateral basal ganglia.   Consultations: Dr.McNeill [facility]   Antibiotics Unasyn 4/24>> 4/25   Discharge Exam: Filed Vitals:   10/28/15 1400 10/28/15 1500 10/28/15 1527 10/28/15 1600  BP: 120/53 106/45  109/83  Pulse:  63  65  Temp:   97.4 F (36.3 C)   TempSrc:   Oral   Resp: Height:      Weight:      SpO2:  100%  98%    General: A/O 4, sitting in bed eating take, NAD Cardiovascular: Regular rhythm or rate, negative murmurs rubs gallops, normal S1/S2 Respiratory: Clear to auscultation bilateral  Discharge Instructions     Medication List    TAKE these medications        albuterol 108 (90 Base) MCG/ACT inhaler  Commonly known as:  PROVENTIL HFA;VENTOLIN HFA  Inhale 2 puffs into the lungs every 6 (six) hours as needed for wheezing or shortness of breath.     aspirin EC 81 MG tablet  Take 81 mg by mouth daily.     atorvastatin 80 MG tablet  Commonly known as:  LIPITOR  Take 80 mg by mouth daily.     clonazePAM 0.5 MG tablet  Commonly known as:  KLONOPIN  Take 1 tablet (0.5 mg total) by mouth 2 (two) times daily.     divalproex 500 MG 24 hr tablet  Commonly known as:  DEPAKOTE ER  Take 3 tablets (1,500 mg total) by mouth at bedtime.     divalproex 500 MG 24 hr tablet  Commonly known as:  DEPAKOTE ER  Take 1 tablet (500 mg total) by mouth every morning.     divalproex 500 MG 24 hr tablet  Commonly known as:  DEPAKOTE ER  Take 3 tablets (1,500 mg total) by mouth at bedtime.     esomeprazole 40 MG capsule  Commonly known as:  NEXIUM  Take 40 mg by mouth daily at 12 noon.     food thickener Powd  Commonly known as:  THICK IT  Use as needed     levothyroxine 25 MCG tablet  Commonly known as:  SYNTHROID, LEVOTHROID  Take 25 mcg by mouth daily before breakfast.     Oxcarbazepine  300 MG tablet  Commonly known as:  TRILEPTAL  Take 1 tablet (300 mg total) by mouth 2 (two) times daily.     risperiDONE 1 MG tablet  Commonly known as:  RISPERDAL  Take 1 tablet (1 mg total) by mouth 2 (two) times daily.     scopolamine 1 MG/3DAYS  Commonly known as:  TRANSDERM-SCOP  Place 1 patch onto the skin every 3 (three) days.     tamsulosin 0.4 MG Caps capsule  Commonly known as:  FLOMAX  Take 0.4 mg by mouth at bedtime.     ziprasidone injection  Commonly known as:  GEODON  Inject 10 mg into the muscle every 2 (two) hours as needed for agitation (maximum two doses or 20 mg within 24 hours.).       Allergies  Allergen Reactions  . Other Other (See Comments)    clorox bleach  . Peanut-Containing Drug Products       The results of  significant diagnostics from this hospitalization (including imaging, microbiology, ancillary and laboratory) are listed below for reference.    Significant Diagnostic Studies: Dg Abd 1 View  10/27/2015  CLINICAL DATA:  Preprocedural evaluation prior to MRI. EXAM: ABDOMEN - 1 VIEW COMPARISON:  None. FINDINGS: Stool is present throughout the colon. Nonobstructed bowel gas pattern. Supine evaluation limited for the detection of free intraperitoneal air. Lumbar spine degenerative changes. IMPRESSION: Stool throughout the colon as can be seen with constipation. Electronically Signed   By: Annia Beltrew  Davis M.D.   On: 10/27/2015 21:39   Ct Head Wo Contrast  10/27/2015  CLINICAL DATA:  Altered mental status.  Seizure. EXAM: CT HEAD WITHOUT CONTRAST TECHNIQUE: Contiguous axial images were obtained from the base of the skull through the vertex without intravenous contrast. COMPARISON:  07/18/2015 FINDINGS: Re- demonstrated age advanced atrophy with centralized volume loss and commensurate ex vacuo dilatation of the ventricular system. Scattered periventricular hypodensities compatible with microvascular ischemic disease. Given background parenchymal  abnormalities, there is no CT evidence of superimposed acute large territory infarct. No intraparenchymal or extra-axial mass or hemorrhage. Unchanged size and configuration of the ventricles and basilar cisterns. No midline shift. There is scattered opacification involving the right anterior and the left anterior and posterior ethmoidal air cells. Small air-fluid levels noted within the left sphenoid sinus. There is a minimal amount of mucosal thickening within the bilateral maxillary sinuses. The remaining paranasal sinuses and mastoid air cells are normally aerated. No air-fluid levels. Regional soft tissues appear normal. No displaced calvarial fracture. Suspected old right nasal bone fracture, unchanged. IMPRESSION:: IMPRESSION: 1. 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 2. Sinus disease including small air-fluid level within left sphenoid sinus as above. Electronically Signed   By: Simonne ComeJohn  Watts M.D.   On: 10/27/2015 17:54   Mr Brain Wo Contrast  10/28/2015  CLINICAL DATA:  Initial evaluation for acute seizure. History of seizures, developmental delay, psychosis. EXAM: MRI HEAD WITHOUT CONTRAST TECHNIQUE: Multiplanar, multiecho pulse sequences of the brain and surrounding structures were obtained without intravenous contrast. COMPARISON:  Prior CT from earlier the same day. FINDINGS: Study mildly degraded by motion artifact. Diffuse prominence of the CSF containing spaces compatible with age-related cerebral atrophy. Patchy and confluent T2/FLAIR hyperintensity within the periventricular and deep white matter most consistent with chronic small vessel ischemic disease, mild in nature. Few small chronic remote lacunar infarcts within the bilateral basal ganglia. No abnormal foci of restricted diffusion to suggest infarct or acute seizure activity. Major intracranial vascular flow voids maintained. Gray-white matter differentiation preserved. No  acute intracranial hemorrhage. No mass lesion, midline shift, or mass effect. No hydrocephalus. No extra-axial fluid collection. Major dural sinuses are patent. Craniocervical junction within normal limits. Visualized upper cervical spine unremarkable. Pituitary gland within normal limits. No acute abnormality about the orbits. Scattered mucosal thickening throughout the paranasal sinuses. No air-fluid levels to suggest active sinus infection. No mastoid effusion. Inner ear structures normal. Bone marrow signal intensity within normal limits. Scalp soft tissues demonstrate no acute abnormality. IMPRESSION: 1. No acute intracranial process. 2. Mild age-related cerebral atrophy with chronic small vessel ischemic disease. 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 Electronically Signed   By: Rise MuBenjamin  McClintock M.D.   On: 10/28/2015 00:15   Dg Chest Port 1 View  10/27/2015  CLINICAL DATA:  Altered mental status.  Recent seizure. EXAM: PORTABLE CHEST 1 VIEW COMPARISON:  Chest radiograph 08/03/2015 FINDINGS: Limited rotated portable chest radiograph with multiple overlying  monitoring leads. Grossly stable cardiac and mediastinal contours. Suggestion of patchy airspace opacities throughout the left mid and upper lung and right lower lung. No large pleural effusion or pneumothorax. IMPRESSION: Limited rotated portable radiograph. Suggestion of opacities within the right lower lung and left mid and upper lung which are nonspecific may be secondary to atelectasis, aspiration or infection. Recommend correlation with repeat PA and lateral chest radiograph when patient clinically able. Electronically Signed   By: Annia Belt M.D.   On: 10/27/2015 18:13    Microbiology: Recent Results (from the past 240 hour(s))  Blood culture (routine x 2)     Status: None (Preliminary result)   Collection Time: 10/27/15  6:57 PM  Result Value Ref Range Status   Specimen Description BLOOD RIGHT HAND  Final    Special Requests BOTTLES DRAWN AEROBIC ONLY 5CC  Final   Culture NO GROWTH < 24 HOURS  Final   Report Status PENDING  Incomplete  Blood culture (routine x 2)     Status: None (Preliminary result)   Collection Time: 10/27/15  7:03 PM  Result Value Ref Range Status   Specimen Description BLOOD RIGHT ANTECUBITAL  Final   Special Requests BOTTLES DRAWN AEROBIC AND ANAEROBIC 5CC  Final   Culture NO GROWTH < 24 HOURS  Final   Report Status PENDING  Incomplete  Urine culture     Status: None (Preliminary result)   Collection Time: 10/27/15  8:08 PM  Result Value Ref Range Status   Specimen Description URINE, CATHETERIZED  Final   Special Requests Normal  Final   Culture TOO YOUNG TO READ  Final   Report Status PENDING  Incomplete  MRSA PCR Screening     Status: None   Collection Time: 10/28/15  6:00 AM  Result Value Ref Range Status   MRSA by PCR NEGATIVE NEGATIVE Final    Comment:        The GeneXpert MRSA Assay (FDA approved for NASAL specimens only), is one component of a comprehensive MRSA colonization surveillance program. It is not intended to diagnose MRSA infection nor to guide or monitor treatment for MRSA infections.      Labs: Basic Metabolic Panel:  Recent Labs Lab 10/27/15 1918 10/28/15 0156  NA 135 137  K 4.2 4.0  CL 101 103  CO2 22 26  GLUCOSE 106* 111*  BUN 10 6  CREATININE 0.75 0.71  CALCIUM 8.8* 8.7*   Liver Function Tests:  Recent Labs Lab 10/27/15 1918  AST 19  ALT 13*  ALKPHOS 68  BILITOT 0.4  PROT 6.7  ALBUMIN 3.5   No results for input(s): LIPASE, AMYLASE in the last 168 hours. No results for input(s): AMMONIA in the last 168 hours. CBC:  Recent Labs Lab 10/27/15 1918 10/28/15 0156  WBC 8.0 10.5  NEUTROABS 5.2 7.7  HGB 12.7* 11.7*  HCT 38.7* 35.0*  MCV 88.8 88.4  PLT 89* 98*   Cardiac Enzymes: No results for input(s): CKTOTAL, CKMB, CKMBINDEX, TROPONINI in the last 168 hours. BNP: BNP (last 3 results) No results for  input(s): BNP in the last 8760 hours.  ProBNP (last 3 results) No results for input(s): PROBNP in the last 8760 hours.  CBG:  Recent Labs Lab 10/27/15 1941  GLUCAP 112*       Signed:  Carolyne Littles, MD [facility] [phone] pager

## 2015-10-29 LAB — URINE CULTURE: SPECIAL REQUESTS: NORMAL

## 2015-11-01 LAB — CULTURE, BLOOD (ROUTINE X 2)
Culture: NO GROWTH
Culture: NO GROWTH

## 2015-12-03 ENCOUNTER — Emergency Department (HOSPITAL_COMMUNITY)
Admission: EM | Admit: 2015-12-03 | Discharge: 2015-12-03 | Disposition: A | Discharge status: Home / Self Care | Payer: Medicare Other | Attending: Emergency Medicine | Admitting: Emergency Medicine

## 2015-12-03 ENCOUNTER — Emergency Department (HOSPITAL_COMMUNITY): Payer: Medicare Other

## 2015-12-03 ENCOUNTER — Encounter (HOSPITAL_COMMUNITY): Payer: Self-pay | Admitting: *Deleted

## 2015-12-03 DIAGNOSIS — G259 Extrapyramidal and movement disorder, unspecified: Secondary | ICD-10-CM

## 2015-12-03 DIAGNOSIS — Z91010 Allergy to peanuts: Secondary | ICD-10-CM | POA: Diagnosis not present

## 2015-12-03 DIAGNOSIS — Z87891 Personal history of nicotine dependence: Secondary | ICD-10-CM | POA: Insufficient documentation

## 2015-12-03 DIAGNOSIS — R269 Unspecified abnormalities of gait and mobility: Secondary | ICD-10-CM

## 2015-12-03 DIAGNOSIS — I639 Cerebral infarction, unspecified: Secondary | ICD-10-CM | POA: Diagnosis not present

## 2015-12-03 DIAGNOSIS — T50905A Adverse effect of unspecified drugs, medicaments and biological substances, initial encounter: Secondary | ICD-10-CM | POA: Diagnosis not present

## 2015-12-03 DIAGNOSIS — Z8673 Personal history of transient ischemic attack (TIA), and cerebral infarction without residual deficits: Secondary | ICD-10-CM | POA: Diagnosis not present

## 2015-12-03 DIAGNOSIS — G2119 Other drug induced secondary parkinsonism: Secondary | ICD-10-CM

## 2015-12-03 DIAGNOSIS — R42 Dizziness and giddiness: Secondary | ICD-10-CM | POA: Diagnosis present

## 2015-12-03 LAB — COMPREHENSIVE METABOLIC PANEL WITH GFR
ALT: 11 U/L — ABNORMAL LOW (ref 17–63)
AST: 17 U/L (ref 15–41)
Albumin: 3.8 g/dL (ref 3.5–5.0)
Alkaline Phosphatase: 77 U/L (ref 38–126)
Anion gap: 6 (ref 5–15)
BUN: 10 mg/dL (ref 6–20)
CO2: 30 mmol/L (ref 22–32)
Calcium: 9 mg/dL (ref 8.9–10.3)
Chloride: 95 mmol/L — ABNORMAL LOW (ref 101–111)
Creatinine, Ser: 0.82 mg/dL (ref 0.61–1.24)
GFR calc Af Amer: >60 mL/min
GFR calc non Af Amer: >60 mL/min
Glucose, Bld: 100 mg/dL — ABNORMAL HIGH (ref 65–99)
Potassium: 4.7 mmol/L (ref 3.5–5.1)
Sodium: 131 mmol/L — ABNORMAL LOW (ref 135–145)
Total Bilirubin: 0.9 mg/dL (ref 0.3–1.2)
Total Protein: 6.9 g/dL (ref 6.5–8.1)

## 2015-12-03 LAB — DIFFERENTIAL
Basophils Absolute: 0 K/uL (ref 0.0–0.1)
Basophils Relative: 0 %
Eosinophils Absolute: 0.4 K/uL (ref 0.0–0.7)
Eosinophils Relative: 4 %
Lymphocytes Relative: 19 %
Lymphs Abs: 1.9 K/uL (ref 0.7–4.0)
Monocytes Absolute: 1.6 K/uL — ABNORMAL HIGH (ref 0.1–1.0)
Monocytes Relative: 16 %
Neutro Abs: 6.1 K/uL (ref 1.7–7.7)
Neutrophils Relative %: 61 %

## 2015-12-03 LAB — AMMONIA: Ammonia: 59 umol/L — ABNORMAL HIGH (ref 9–35)

## 2015-12-03 LAB — ETHANOL: Alcohol, Ethyl (B): <5 mg/dL (ref <5)

## 2015-12-03 LAB — I-STAT CHEM 8, ED
BUN: 10 mg/dL (ref 6–20)
CALCIUM ION: 1.15 mmol/L (ref 1.12–1.23)
CHLORIDE: 93 mmol/L — AB (ref 101–111)
Creatinine, Ser: 0.8 mg/dL (ref 0.61–1.24)
GLUCOSE: 94 mg/dL (ref 65–99)
HCT: 35 % — ABNORMAL LOW (ref 39.0–52.0)
Hemoglobin: 11.9 g/dL — ABNORMAL LOW (ref 13.0–17.0)
Potassium: 4.9 mmol/L (ref 3.5–5.1)
Sodium: 131 mmol/L — ABNORMAL LOW (ref 135–145)
TCO2: 32 mmol/L (ref 0–100)

## 2015-12-03 LAB — I-STAT TROPONIN, ED: Troponin i, poc: 0 ng/mL (ref 0.00–0.08)

## 2015-12-03 LAB — VALPROIC ACID LEVEL: Valproic Acid Lvl: 133 ug/mL — ABNORMAL HIGH (ref 50.0–100.0)

## 2015-12-03 LAB — RAPID URINE DRUG SCREEN, HOSP PERFORMED
Amphetamines: NOT DETECTED
BARBITURATES: NOT DETECTED
BENZODIAZEPINES: NOT DETECTED
COCAINE: NOT DETECTED
OPIATES: NOT DETECTED
TETRAHYDROCANNABINOL: NOT DETECTED

## 2015-12-03 LAB — APTT: aPTT: 50 s — ABNORMAL HIGH (ref 24–37)

## 2015-12-03 LAB — CBC
HCT: 34.1 % — ABNORMAL LOW (ref 39.0–52.0)
Hemoglobin: 11.7 g/dL — ABNORMAL LOW (ref 13.0–17.0)
MCH: 29.1 pg (ref 26.0–34.0)
MCHC: 34.3 g/dL (ref 30.0–36.0)
MCV: 84.8 fL (ref 78.0–100.0)
Platelets: 74 K/uL — ABNORMAL LOW (ref 150–400)
RBC: 4.02 MIL/uL — ABNORMAL LOW (ref 4.22–5.81)
RDW: 14.7 % (ref 11.5–15.5)
WBC: 10 K/uL (ref 4.0–10.5)

## 2015-12-03 LAB — PROTIME-INR
INR: 1.2 (ref 0.00–1.49)
PROTHROMBIN TIME: 15 s (ref 11.6–15.2)

## 2015-12-03 LAB — CBG MONITORING, ED: GLUCOSE-CAPILLARY: 99 mg/dL (ref 65–99)

## 2015-12-03 MED ORDER — IOPAMIDOL (ISOVUE-370) INJECTION 76%
100.0000 mL | Freq: Once | INTRAVENOUS | Status: AC | PRN
  Administered 2015-12-03: 100 mL via INTRAVENOUS

## 2015-12-03 NOTE — Discharge Instructions (Signed)
You do not have Parkinson's disease. Her symptoms are caused by the medications these been taking. Your seizures medication levels were elevated today please do not take your next dose of the medication and call your family doctor in the morning for instructions whether or not to continue taking it or follow-up for recheck level.

## 2015-12-03 NOTE — ED Notes (Signed)
Code stroked called by Dr Adela LankFloyd.

## 2015-12-03 NOTE — Consult Note (Signed)
Requesting Physician: Dr.  Adela Lank    Reason for consultation:  To rule out stroke  HPI:                                                                                                                                         Jeff Foster is an 55 y.o. male patient who  was brought into the emergency room for further evaluation of gait problems, and slurred speech, possible left-sided weakness. He lives in a group home, has poor baseline with gait dysfunction, psychiatric history with being on antipsychotic medications. Reports having a stroke several years ago in the past although he is not clear how the stroke has affected him except that he's been having trouble with walking because of stroke, per history No family or group home staff at bedside. Patient is a poor historian.   Past Medical History: Past Medical History  Diagnosis Date  . Seizure (HCC)   . Stroke (cerebrum) [facility])     History reviewed. No pertinent past surgical history.  Family History: No family history on file.  Social History:   reports that he has quit smoking. He does not have any smokeless tobacco history on file. His alcohol and drug histories are not on file.  Allergies:  Allergies  Allergen Reactions  . Other Other (See Comments)    clorox bleach  . Peanut-Containing Drug Products Diarrhea     Medications:                                                                                                                        No current facility-administered medications for this encounter.  Current outpatient prescriptions:  .  acetaminophen (TYLENOL) 500 MG tablet, Take 500 mg by mouth every 6 (six) hours as needed for mild pain, moderate pain, fever or headache., Disp: , Rfl:  .  albuterol (PROVENTIL HFA;VENTOLIN HFA) 108 (90 Base) MCG/ACT inhaler, Inhale 2 puffs into the lungs every 6 (six) hours as needed for wheezing or shortness of breath., Disp: , Rfl:  .  atorvastatin (LIPITOR) 40 MG tablet, Take 40 mg  by mouth at bedtime., Disp: , Rfl:  .  clonazePAM (KLONOPIN) 0.5 MG tablet, Take 1 tablet (0.5 mg total) by mouth 2 (two) times daily., Disp: 30 tablet, Rfl: 0 .  divalproex (DEPAKOTE ER) 500 MG 24 hr tablet, Take 1 tablet (500 mg total)  by mouth every morning., Disp: 30 tablet, Rfl: 0 .  divalproex (DEPAKOTE ER) 500 MG 24 hr tablet, Take 3 tablets (1,500 mg total) by mouth at bedtime., Disp: 90 tablet, Rfl: 0 .  food thickener (THICK IT) POWD, Use as needed, Disp: , Rfl: 0 .  levothyroxine (SYNTHROID, LEVOTHROID) 25 MCG tablet, Take 25 mcg by mouth daily before breakfast., Disp: , Rfl:  .  omeprazole (PRILOSEC) 20 MG capsule, Take 20 mg by mouth daily., Disp: , Rfl:  .  Oxcarbazepine (TRILEPTAL) 300 MG tablet, Take 1 tablet (300 mg total) by mouth 2 (two) times daily., Disp: 30 tablet, Rfl: 2 .  risperiDONE (RISPERDAL) 1 MG tablet, Take 1 tablet (1 mg total) by mouth 2 (two) times daily., Disp: 30 tablet, Rfl: 2 .  tamsulosin (FLOMAX) 0.4 MG CAPS capsule, Take 0.4 mg by mouth at bedtime., Disp: , Rfl:  .  traZODone (DESYREL) 50 MG tablet, Take 50 mg by mouth at bedtime., Disp: , Rfl:  .  divalproex (DEPAKOTE ER) 500 MG 24 hr tablet, Take 3 tablets (1,500 mg total) by mouth at bedtime. (Patient not taking: Reported on 12/03/2015), Disp: 30 tablet, Rfl: 2 .  ziprasidone (GEODON) injection, Inject 10 mg into the muscle every 2 (two) hours as needed for agitation (maximum two doses or 20 mg within 24 hours.). (Patient not taking: Reported on 12/03/2015), Disp: 1 each, Rfl:    ROS:                                                                                                                                       History    unobtainable from patient due to lack of cooperation  Neurologic Examination:                                                                                                    Today's Vitals   12/03/15 1046 12/03/15 1130 12/03/15 1337  BP: 124/80 108/62 135/84  Pulse: 66 68 61   TempSrc:   Oral  Resp: SpO2: 99% 96% 99%    Evaluation of higher integrative functions including: Level of alertness: Alert,  Oriented to time, place and person Speech: fluent, no evidence of dysarthria or aphasia noted. Mild hypophonia noted Test the following cranial nerves: 2-12 grossly intact Motor examination: Mild to Moderate Rigidity in all 4 extremities, worse on the left side, able to demonstrate full 5/5 motor strength in all 4 extremities Examination of sensation :  symmetric sensation  to pinprick in all 4 extremities and on face Examination of deep tendon reflexes: 2+, symmetric in all extremities, normal plantars bilaterally Test coordination: Normal finger nose testing, with no evidence of limb appendicular ataxia. Mild intermittent resting tremor in the fingers with cogwheeling at the wrists noted.     Lab Results: Basic Metabolic Panel:  Recent Labs Lab 12/03/15 1102 12/03/15 1135  NA 131* 131*  K 4.7 4.9  CL 95* 93*  CO2 30  --   GLUCOSE 100* 94  BUN 10 10  CREATININE 0.82 0.80  CALCIUM 9.0  --     Liver Function Tests:  Recent Labs Lab 12/03/15 1102  AST 17  ALT 11*  ALKPHOS 77  BILITOT 0.9  PROT 6.9  ALBUMIN 3.8   No results for input(s): LIPASE, AMYLASE in the last 168 hours.  Recent Labs Lab 12/03/15 1102  AMMONIA 59*    CBC:  Recent Labs Lab 12/03/15 1102 12/03/15 1135  WBC 10.0  --   NEUTROABS 6.1  --   HGB 11.7* 11.9*  HCT 34.1* 35.0*  MCV 84.8  --   PLT 74*  --     Cardiac Enzymes: No results for input(s): CKTOTAL, CKMB, CKMBINDEX, TROPONINI in the last 168 hours.  Lipid Panel: No results for input(s): CHOL, TRIG, HDL, CHOLHDL, VLDL, LDLCALC in the last 168 hours.  CBG:  Recent Labs Lab 12/03/15 1056  GLUCAP 99    Microbiology: Results for orders placed or performed during the hospital encounter of 10/27/15  Blood culture (routine x 2)     Status: None   Collection Time: 10/27/15  6:57 PM  Result  Value Ref Range Status   Specimen Description BLOOD RIGHT HAND  Final   Special Requests BOTTLES DRAWN AEROBIC ONLY 5CC  Final   Culture NO GROWTH 5 DAYS  Final   Report Status 11/01/2015 FINAL  Final  Blood culture (routine x 2)     Status: None   Collection Time: 10/27/15  7:03 PM  Result Value Ref Range Status   Specimen Description BLOOD RIGHT ANTECUBITAL  Final   Special Requests BOTTLES DRAWN AEROBIC AND ANAEROBIC 5CC  Final   Culture NO GROWTH 5 DAYS  Final   Report Status 11/01/2015 FINAL  Final  Urine culture     Status: Abnormal   Collection Time: 10/27/15  8:08 PM  Result Value Ref Range Status   Specimen Description URINE, CATHETERIZED  Final   Special Requests Normal  Final   Culture MULTIPLE SPECIES PRESENT, SUGGEST RECOLLECTION (A)  Final   Report Status 10/29/2015 FINAL  Final  MRSA PCR Screening     Status: None   Collection Time: 10/28/15  6:00 AM  Result Value Ref Range Status   MRSA by PCR NEGATIVE NEGATIVE Final    Comment:        The GeneXpert MRSA Assay (FDA approved for NASAL specimens only), is one component of a comprehensive MRSA colonization surveillance program. It is not intended to diagnose MRSA infection nor to guide or monitor treatment for MRSA infections.      Imaging: Ct Angio Head W/cm &/or Wo Cm  12/03/2015  CLINICAL DATA:  55 year old male with slurred speech and abnormal gait for 1 day. Initial encounter. EXAM: CT ANGIOGRAPHY HEAD AND NECK TECHNIQUE: Multidetector CT imaging of the head and neck was performed using the standard protocol during bolus administration of intravenous contrast. Multiplanar CT image reconstructions and MIPs were obtained to evaluate the vascular anatomy. Carotid stenosis measurements (  when applicable) are obtained utilizing NASCET criteria, using the distal internal carotid diameter as the denominator. CONTRAST:  100 mL Isovue 370. COMPARISON:  Noncontrast head CT 1125 hours today. FINDINGS: CTA NECK Skeleton:  Moderate to severe lower cervical spine disc and endplate degeneration. Absent dentition. Partially visible T5 superior endplate deformity, the visualized portion of which most resembles a large chronic Schmorl node. Mild maxillary and ethmoid sinus mucosal thickening. Other neck: Negative lung apices aside from mild paraseptal emphysema on the right. No superior mediastinal lymphadenopathy. Mild gas distention of the upper thoracic esophagus. Thyroid, larynx, pharynx, parapharyngeal spaces, retropharyngeal space, sublingual space, submandibular glands, and parotid glands are within normal limits. No cervical lymphadenopathy. Negative orbit and scalp soft tissues. Aortic arch: 3 vessel arch configuration. Mild arch atherosclerosis. Right carotid system: Mild motion artifact at the level of the thyroid, otherwise negative right CCA. Negative right carotid bifurcation. Minimal calcified plaque in the right ICA bulb. No cervical right carotid stenosis. Left carotid system: Negative left CCA. Negative left carotid bifurcation. Negative cervical left ICA. Vertebral arteries: No proximal subclavian artery stenosis. Normal right vertebral artery origin. Non dominant right vertebral artery appears normal to the skullbase, where it partially terminates in muscular branches (series 5, image 110). The left vertebral artery is dominant. There is mild calcified plaque at the anterior left vertebral artery origin without stenosis. Negative cervical left vertebral artery otherwise. CTA HEAD Posterior circulation: Negative distal vertebral arteries, the left is dominant. There does appear to be a small fenestration in the non dominant right V4 segment (normal variant). Normal basilar artery without stenosis. Normal SCA and left PCA origins. Fetal type right PCA origin. Bilateral PCA branches are normal. Anterior circulation: Both ICA siphons are patent. There is mild supraclinoid calcified plaque without stenosis. There is mild  cavernous right ICA calcified plaque without stenosis. Ophthalmic and right posterior communicating artery origins are normal. The left posterior communicating artery is diminutive or absent. Normal carotid termini. Dominant left and diminutive right ACA A1 segments. Anterior communicating artery and bilateral ACA branches are normal. Left MCA M1 segment, bifurcation, and left MCA branches are within normal limits. Right MCA M1 segment, trifurcation and right MCA branches are within normal limits. Venous sinuses: Patent. Anatomic variants: Dominant left vertebral artery. Dominant left ACA A1 segment. Small fenestration in the right vertebral artery V4 segment. Fetal type right PCA origin. Delayed phase: No abnormal enhancement identified. Stable CT appearance of the brain. IMPRESSION: 1. Negative for age CTA head and neck. Minimal carotid artery plaque with no stenosis, and no circle of Willis branch occlusion identified. 2. Stable CT appearance of the brain. 3. No acute findings identified in the neck. Electronically Signed   By: Odessa FlemingH  Hall M.D.   On: 12/03/2015 12:33   Dg Chest 2 View  12/03/2015  CLINICAL DATA:  Coughing after drinking EXAM: CHEST  2 VIEW COMPARISON:  10/27/2015 chest radiograph. FINDINGS: Stable cardiomediastinal silhouette with normal heart size. No pneumothorax. No pleural effusion. Lungs appear clear, with no acute consolidative airspace disease and no pulmonary edema. IMPRESSION: No active cardiopulmonary disease. Electronically Signed   By: Delbert PhenixJason A Poff M.D.   On: 12/03/2015 13:18   Ct Head Wo Contrast  12/03/2015  CLINICAL DATA:  Slurred speech and unsteady gait for 1 day. Dizziness. EXAM: CT HEAD WITHOUT CONTRAST TECHNIQUE: Contiguous axial images were obtained from the base of the skull through the vertex without intravenous contrast. COMPARISON:  Head CT and brain MRI October 27, 2015 FINDINGS: There is mild  diffuse atrophy, stable. There is no intracranial mass, hemorrhage,  extra-axial fluid collection, or midline shift. There is mild patchy small vessel disease in the centra semiovale bilaterally. There is no new gray-white compartment lesion. No acute infarct evident. Middle cerebral artery attenuation is symmetric and unremarkable bilaterally. The bony calvarium appears intact. The mastoid air cells are clear. No intraorbital lesions are evident. There is mucosal thickening in multiple ethmoid air cells bilaterally. IMPRESSION: Mild atrophy with stable mild patchy periventricular small vessel disease. No acute infarct evident. No hemorrhage or mass effect. Areas of ethmoid sinus disease bilaterally noted. Critical Value/emergent results were called by telephone at the time of interpretation on 12/03/2015 at 11:33 am to Dr. Melene Plan , who verbally acknowledged these results. Electronically Signed   By: Bretta Bang III M.D.   On: 12/03/2015 11:34   Ct Angio Neck W/cm &/or Wo/cm  12/03/2015  CLINICAL DATA:  55 year old male with slurred speech and abnormal gait for 1 day. Initial encounter. EXAM: CT ANGIOGRAPHY HEAD AND NECK TECHNIQUE: Multidetector CT imaging of the head and neck was performed using the standard protocol during bolus administration of intravenous contrast. Multiplanar CT image reconstructions and MIPs were obtained to evaluate the vascular anatomy. Carotid stenosis measurements (when applicable) are obtained utilizing NASCET criteria, using the distal internal carotid diameter as the denominator. CONTRAST:  100 mL Isovue 370. COMPARISON:  Noncontrast head CT 1125 hours today. FINDINGS: CTA NECK Skeleton: Moderate to severe lower cervical spine disc and endplate degeneration. Absent dentition. Partially visible T5 superior endplate deformity, the visualized portion of which most resembles a large chronic Schmorl node. Mild maxillary and ethmoid sinus mucosal thickening. Other neck: Negative lung apices aside from mild paraseptal emphysema on the right. No  superior mediastinal lymphadenopathy. Mild gas distention of the upper thoracic esophagus. Thyroid, larynx, pharynx, parapharyngeal spaces, retropharyngeal space, sublingual space, submandibular glands, and parotid glands are within normal limits. No cervical lymphadenopathy. Negative orbit and scalp soft tissues. Aortic arch: 3 vessel arch configuration. Mild arch atherosclerosis. Right carotid system: Mild motion artifact at the level of the thyroid, otherwise negative right CCA. Negative right carotid bifurcation. Minimal calcified plaque in the right ICA bulb. No cervical right carotid stenosis. Left carotid system: Negative left CCA. Negative left carotid bifurcation. Negative cervical left ICA. Vertebral arteries: No proximal subclavian artery stenosis. Normal right vertebral artery origin. Non dominant right vertebral artery appears normal to the skullbase, where it partially terminates in muscular branches (series 5, image 110). The left vertebral artery is dominant. There is mild calcified plaque at the anterior left vertebral artery origin without stenosis. Negative cervical left vertebral artery otherwise. CTA HEAD Posterior circulation: Negative distal vertebral arteries, the left is dominant. There does appear to be a small fenestration in the non dominant right V4 segment (normal variant). Normal basilar artery without stenosis. Normal SCA and left PCA origins. Fetal type right PCA origin. Bilateral PCA branches are normal. Anterior circulation: Both ICA siphons are patent. There is mild supraclinoid calcified plaque without stenosis. There is mild cavernous right ICA calcified plaque without stenosis. Ophthalmic and right posterior communicating artery origins are normal. The left posterior communicating artery is diminutive or absent. Normal carotid termini. Dominant left and diminutive right ACA A1 segments. Anterior communicating artery and bilateral ACA branches are normal. Left MCA M1 segment,  bifurcation, and left MCA branches are within normal limits. Right MCA M1 segment, trifurcation and right MCA branches are within normal limits. Venous sinuses: Patent. Anatomic variants: Dominant left vertebral  artery. Dominant left ACA A1 segment. Small fenestration in the right vertebral artery V4 segment. Fetal type right PCA origin. Delayed phase: No abnormal enhancement identified. Stable CT appearance of the brain. IMPRESSION: 1. Negative for age CTA head and neck. Minimal carotid artery plaque with no stenosis, and no circle of Willis branch occlusion identified. 2. Stable CT appearance of the brain. 3. No acute findings identified in the neck. Electronically Signed   By: Odessa Fleming M.D.   On: 12/03/2015 12:33       Assessment and plan:   Kiara Keep is an 55 y.o. male patient who is sent to the emergency room from the group home for further evaluation of slurred speech and weakness symptoms. His neurological examination is significant for extrapyramidal symptoms with rigidity in all extremity is, worse on the left side, no evidence of dysarthria or aphasia is noted, mild hypophonia is seen. I suspect that his extrapyramidal symptoms are secondary to chronic antipsychotic medication use. CT of the head done in the ER did not show any evidence of acute pathology. I personally reviewed the MRI of the brain from 10/27/2015, no evidence of acute or chronic infarct noted. At this time, recommend CT a of head and neck for further neurodiagnostic workup to rule out any acute pathology. He is on Depakote for bipolar disorder, with supratherapeutic level of 133. Since the blood was drawn at 11 AM which is likely within a couple of hours after his morning dose, these levels are not reliable, does not represent trough levels. Hence Recommend rechecking trough levels through his psychiatrist or PCP office prior to his morning dose. If the trough levels remain greater than 100, need to reduce the Depakote dose,  especially nighttime dose which he is prescribed 1500 mg, and 500 mg in the morning.   The CTA studies are completed in the ER, and are negative for any acute pathology or significant stenosis or occlusion. At this time, no further neurodiagnostic testing is recommended. He is advised to follow-up with outpatient neurology as well as psychiatric for management of his antipsychotic medications, dose adjustment of Depakote based on the trough levels, and treatment of extrapyramidal symptoms noted on exam. Discussed with ER physician.

## 2015-12-03 NOTE — ED Notes (Signed)
Jamesetta Orleansalled Michael, caregiver , to inform him about the discharge order and to provide transport for pt.

## 2015-12-03 NOTE — ED Notes (Addendum)
Pt's caregiver reports unsteady gait and slurred speech this am, was last seen normal when he arrived at his house at 0900.  Pt reports last night he also felt dizzy.  Pt was drinking water in triage, after drinking, pt started coughing.  Instructed pt to stop drinking at this time.

## 2015-12-03 NOTE — ED Provider Notes (Signed)
CSN: [id number]     Arrival date & time 12/03/15  1038 History   First MD Initiated Contact with Patient 12/03/15 1051     Chief Complaint  Patient presents with  . Aphasia  . Dizziness     (Consider location/radiation/quality/duration/timing/severity/associated sxs/prior Treatment) Patient is a 55 y.o. male presenting with dizziness and Acute Neurological Problem. The history is provided by the patient and the nursing home.  Dizziness Associated symptoms: weakness   Associated symptoms: no chest pain, no diarrhea, no headaches, no palpitations, no shortness of breath and no vomiting   Cerebrovascular Accident This is a new problem. The current episode started 1 to 2 hours ago. The problem occurs constantly. The problem has not changed since onset.Pertinent negatives include no chest pain, no abdominal pain, no headaches and no shortness of breath. Nothing aggravates the symptoms. Nothing relieves the symptoms. He has tried nothing for the symptoms. The treatment provided no relief.   55 yo M With a chief complaint of slurred speech and weakness. This happened acutely couple hours ago. Patient has reportedly a history of stroke in the past lives in a group home. They had trouble getting him to continue walking and was different from his baseline and called 911. Patient states he has had a stroke in the past and does not add much more to the history.  Past Medical History  Diagnosis Date  . Seizure (HCC)   . 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 No pertinent past surgical history. No family history on file. Social History  Substance Use Topics  . Smoking status: Former Games developermoker  . Smokeless tobacco: None  . Alcohol Use: None    Review of Systems  Constitutional: Negative for fever and chills.  HENT: Negative for congestion and facial swelling.   Eyes: Negative for discharge and visual disturbance.  Respiratory: Negative for shortness of breath.   Cardiovascular: Negative  for chest pain and palpitations.  Gastrointestinal: Negative for vomiting, abdominal pain and diarrhea.  Musculoskeletal: Negative for myalgias and arthralgias.  Skin: Negative for color change and rash.  Neurological: Positive for dizziness, speech difficulty and weakness. Negative for tremors, syncope and headaches.  Psychiatric/Behavioral: Negative for confusion and dysphoric mood.      Allergies  Other and Peanut-containing drug products  Home Medications   Prior to Admission medications   Medication Sig Start Date End Date Taking? Authorizing Provider  acetaminophen (TYLENOL) 500 MG tablet Take 500 mg by mouth every 6 (six) hours as needed for mild pain, moderate pain, fever or headache.   Yes Historical Provider, MD  albuterol (PROVENTIL HFA;VENTOLIN HFA) 108 (90 Base) MCG/ACT inhaler Inhale 2 puffs into the lungs every 6 (six) hours as needed for wheezing or shortness of breath.   Yes Historical Provider, MD  atorvastatin (LIPITOR) 40 MG tablet Take 40 mg by mouth at bedtime.   Yes Historical Provider, MD  clonazePAM (KLONOPIN) 0.5 MG tablet Take 1 tablet (0.5 mg total) by mouth 2 (two) times daily. 08/04/15  Yes Meredeth IdeGagan S Lama, MD  divalproex (DEPAKOTE ER) 500 MG 24 hr tablet Take 1 tablet (500 mg total) by mouth every morning. 10/28/15  Yes Drema Dallasurtis J Woods, MD  divalproex (DEPAKOTE ER) 500 MG 24 hr tablet Take 3 tablets (1,500 mg total) by mouth at bedtime. 10/28/15  Yes Drema Dallasurtis J Woods, MD  food thickener [facility](THICK IT) POWD Use as needed 08/04/15  Yes Meredeth IdeGagan S Lama, MD  levothyroxine (SYNTHROID, LEVOTHROID) 25 MCG tablet Take 25 mcg by  mouth daily before breakfast.   Yes Historical Provider, MD  omeprazole (PRILOSEC) 20 MG capsule Take 20 mg by mouth daily.   Yes Historical Provider, MD  Oxcarbazepine (TRILEPTAL) 300 MG tablet Take 1 tablet (300 mg total) by mouth 2 (two) times daily. 08/04/15  Yes Meredeth Ide, MD  risperiDONE (RISPERDAL) 1 MG tablet Take 1 tablet (1 mg total) by mouth 2  (two) times daily. 08/04/15  Yes Meredeth Ide, MD  tamsulosin (FLOMAX) 0.4 MG CAPS capsule Take 0.4 mg by mouth at bedtime.   Yes Historical Provider, MD  traZODone (DESYREL) 50 MG tablet Take 50 mg by mouth at bedtime.   Yes Historical Provider, MD  divalproex (DEPAKOTE ER) 500 MG 24 hr tablet Take 3 tablets (1,500 mg total) by mouth at bedtime. Patient not taking: Reported on 12/03/2015 08/04/15   Meredeth Ide, MD  ziprasidone (GEODON) injection Inject 10 mg into the muscle every 2 (two) hours as needed for agitation (maximum two doses or 20 mg within 24 hours.). Patient not taking: Reported on 12/03/2015 08/04/15   Meredeth Ide, MD   BP 135/84 mmHg  Pulse 61  Resp 17  SpO2 99% Physical Exam  Constitutional: He is oriented to person, place, and time. He appears well-developed and well-nourished.  HENT:  Head: Normocephalic and atraumatic.  Eyes: EOM are normal. Pupils are equal, round, and reactive to light.  Neck: Normal range of motion. Neck supple. No JVD present.  Cardiovascular: Normal rate and regular rhythm.  Exam reveals no gallop and no friction rub.   No murmur heard. Pulmonary/Chest: No respiratory distress. He has no wheezes.  Abdominal: He exhibits no distension. There is no tenderness. There is no rebound and no guarding.  Musculoskeletal: Normal range of motion.  Neurological: He is alert and oriented to person, place, and time. He displays tremor. A cranial nerve deficit is present. No sensory deficit. Coordination and gait abnormal. He displays no Babinski's sign on the right side. He displays no Babinski's sign on the left side.  Reflex Scores:      Tricep reflexes are 2+ on the right side and 2+ on the left side.      Bicep reflexes are 2+ on the right side and 2+ on the left side.      Brachioradialis reflexes are 2+ on the right side and 2+ on the left side.      Patellar reflexes are 2+ on the right side and 2+ on the left side.      Achilles reflexes are 2+ on the  right side and 2+ on the left side. Difficulty with ambulation on my exam, left sided facial droop, LLE weakness.   Skin: No rash noted. No pallor.  Psychiatric: He has a normal mood and affect. His behavior is normal.  Nursing note and vitals reviewed.   ED Course  Procedures (including critical care time) Labs Review Labs Reviewed  APTT - Abnormal; Notable for the following:    aPTT 50 (*)    All other components within normal limits  CBC - Abnormal; Notable for the following:    RBC 4.02 (*)    Hemoglobin 11.7 (*)    HCT 34.1 (*)    Platelets 74 (*)    All other components within normal limits  DIFFERENTIAL - Abnormal; Notable for the following:    Monocytes Absolute 1.6 (*)    All other components within normal limits  COMPREHENSIVE METABOLIC PANEL - Abnormal; Notable for the following:  Sodium 131 (*)    Chloride 95 (*)    Glucose, Bld 100 (*)    ALT 11 (*)    All other components within normal limits  VALPROIC ACID LEVEL - Abnormal; Notable for the following:    Valproic Acid Lvl 133 (*)    All other components within normal limits  AMMONIA - Abnormal; Notable for the following:    Ammonia 59 (*)    All other components within normal limits  I-STAT CHEM 8, ED - Abnormal; Notable for the following:    Sodium 131 (*)    Chloride 93 (*)    Hemoglobin 11.9 (*)    HCT 35.0 (*)    All other components within normal limits  PROTIME-INR  URINE RAPID DRUG SCREEN, HOSP PERFORMED  ETHANOL  I-STAT TROPOININ, ED  CBG MONITORING, ED    Imaging Review Ct Angio Head W/cm &/or Wo Cm  12/03/2015  CLINICAL DATA:  55 year old male with slurred speech and abnormal gait for 1 day. Initial encounter. EXAM: CT ANGIOGRAPHY HEAD AND NECK TECHNIQUE: Multidetector CT imaging of the head and neck was performed using the standard protocol during bolus administration of intravenous contrast. Multiplanar CT image reconstructions and MIPs were obtained to evaluate the vascular anatomy.  Carotid stenosis measurements (when applicable) are obtained utilizing NASCET criteria, using the distal internal carotid diameter as the denominator. CONTRAST:  100 mL Isovue 370. COMPARISON:  Noncontrast head CT 1125 hours today. FINDINGS: CTA NECK Skeleton: Moderate to severe lower cervical spine disc and endplate degeneration. Absent dentition. Partially visible T5 superior endplate deformity, the visualized portion of which most resembles a large chronic Schmorl node. Mild maxillary and ethmoid sinus mucosal thickening. Other neck: Negative lung apices aside from mild paraseptal emphysema on the right. No superior mediastinal lymphadenopathy. Mild gas distention of the upper thoracic esophagus. Thyroid, larynx, pharynx, parapharyngeal spaces, retropharyngeal space, sublingual space, submandibular glands, and parotid glands are within normal limits. No cervical lymphadenopathy. Negative orbit and scalp soft tissues. Aortic arch: 3 vessel arch configuration. Mild arch atherosclerosis. Right carotid system: Mild motion artifact at the level of the thyroid, otherwise negative right CCA. Negative right carotid bifurcation. Minimal calcified plaque in the right ICA bulb. No cervical right carotid stenosis. Left carotid system: Negative left CCA. Negative left carotid bifurcation. Negative cervical left ICA. Vertebral arteries: No proximal subclavian artery stenosis. Normal right vertebral artery origin. Non dominant right vertebral artery appears normal to the skullbase, where it partially terminates in muscular branches (series 5, image 110). The left vertebral artery is dominant. There is mild calcified plaque at the anterior left vertebral artery origin without stenosis. Negative cervical left vertebral artery otherwise. CTA HEAD Posterior circulation: Negative distal vertebral arteries, the left is dominant. There does appear to be a small fenestration in the non dominant right V4 segment (normal variant). Normal  basilar artery without stenosis. Normal SCA and left PCA origins. Fetal type right PCA origin. Bilateral PCA branches are normal. Anterior circulation: Both ICA siphons are patent. There is mild supraclinoid calcified plaque without stenosis. There is mild cavernous right ICA calcified plaque without stenosis. Ophthalmic and right posterior communicating artery origins are normal. The left posterior communicating artery is diminutive or absent. Normal carotid termini. Dominant left and diminutive right ACA A1 segments. Anterior communicating artery and bilateral ACA branches are normal. Left MCA M1 segment, bifurcation, and left MCA branches are within normal limits. Right MCA M1 segment, trifurcation and right MCA branches are within normal limits. Venous sinuses: Patent.  Anatomic variants: Dominant left vertebral artery. Dominant left ACA A1 segment. Small fenestration in the right vertebral artery V4 segment. Fetal type right PCA origin. Delayed phase: No abnormal enhancement identified. Stable CT appearance of the brain. IMPRESSION: 1. Negative for age CTA head and neck. Minimal carotid artery plaque with no stenosis, and no circle of Willis branch occlusion identified. 2. Stable CT appearance of the brain. 3. No acute findings identified in the neck. Electronically Signed   By: Odessa Fleming M.D.   On: 12/03/2015 12:33   Dg Chest 2 View  12/03/2015  CLINICAL DATA:  Coughing after drinking EXAM: CHEST  2 VIEW COMPARISON:  10/27/2015 chest radiograph. FINDINGS: Stable cardiomediastinal silhouette with normal heart size. No pneumothorax. No pleural effusion. Lungs appear clear, with no acute consolidative airspace disease and no pulmonary edema. IMPRESSION: No active cardiopulmonary disease. Electronically Signed   By: Delbert Phenix M.D.   On: 12/03/2015 13:18   Ct Head Wo Contrast  12/03/2015  CLINICAL DATA:  Slurred speech and unsteady gait for 1 day. Dizziness. EXAM: CT HEAD WITHOUT CONTRAST TECHNIQUE:  Contiguous axial images were obtained from the base of the skull through the vertex without intravenous contrast. COMPARISON:  Head CT and brain MRI October 27, 2015 FINDINGS: There is mild diffuse atrophy, stable. There is no intracranial mass, hemorrhage, extra-axial fluid collection, or midline shift. There is mild patchy small vessel disease in the centra semiovale bilaterally. There is no new gray-white compartment lesion. No acute infarct evident. Middle cerebral artery attenuation is symmetric and unremarkable bilaterally. The bony calvarium appears intact. The mastoid air cells are clear. No intraorbital lesions are evident. There is mucosal thickening in multiple ethmoid air cells bilaterally. IMPRESSION: Mild atrophy with stable mild patchy periventricular small vessel disease. No acute infarct evident. No hemorrhage or mass effect. Areas of ethmoid sinus disease bilaterally noted. Critical Value/emergent results were called by telephone at the time of interpretation on 12/03/2015 at 11:33 am to Dr. Melene Plan , who verbally acknowledged these results. Electronically Signed   By: Bretta Bang III M.D.   On: 12/03/2015 11:34   Ct Angio Neck W/cm &/or Wo/cm  12/03/2015  CLINICAL DATA:  55 year old male with slurred speech and abnormal gait for 1 day. Initial encounter. EXAM: CT ANGIOGRAPHY HEAD AND NECK TECHNIQUE: Multidetector CT imaging of the head and neck was performed using the standard protocol during bolus administration of intravenous contrast. Multiplanar CT image reconstructions and MIPs were obtained to evaluate the vascular anatomy. Carotid stenosis measurements (when applicable) are obtained utilizing NASCET criteria, using the distal internal carotid diameter as the denominator. CONTRAST:  100 mL Isovue 370. COMPARISON:  Noncontrast head CT 1125 hours today. FINDINGS: CTA NECK Skeleton: Moderate to severe lower cervical spine disc and endplate degeneration. Absent dentition. Partially  visible T5 superior endplate deformity, the visualized portion of which most resembles a large chronic Schmorl node. Mild maxillary and ethmoid sinus mucosal thickening. Other neck: Negative lung apices aside from mild paraseptal emphysema on the right. No superior mediastinal lymphadenopathy. Mild gas distention of the upper thoracic esophagus. Thyroid, larynx, pharynx, parapharyngeal spaces, retropharyngeal space, sublingual space, submandibular glands, and parotid glands are within normal limits. No cervical lymphadenopathy. Negative orbit and scalp soft tissues. Aortic arch: 3 vessel arch configuration. Mild arch atherosclerosis. Right carotid system: Mild motion artifact at the level of the thyroid, otherwise negative right CCA. Negative right carotid bifurcation. Minimal calcified plaque in the right ICA bulb. No cervical right carotid stenosis. Left carotid system:  Negative left CCA. Negative left carotid bifurcation. Negative cervical left ICA. Vertebral arteries: No proximal subclavian artery stenosis. Normal right vertebral artery origin. Non dominant right vertebral artery appears normal to the skullbase, where it partially terminates in muscular branches (series 5, image 110). The left vertebral artery is dominant. There is mild calcified plaque at the anterior left vertebral artery origin without stenosis. Negative cervical left vertebral artery otherwise. CTA HEAD Posterior circulation: Negative distal vertebral arteries, the left is dominant. There does appear to be a small fenestration in the non dominant right V4 segment (normal variant). Normal basilar artery without stenosis. Normal SCA and left PCA origins. Fetal type right PCA origin. Bilateral PCA branches are normal. Anterior circulation: Both ICA siphons are patent. There is mild supraclinoid calcified plaque without stenosis. There is mild cavernous right ICA calcified plaque without stenosis. Ophthalmic and right posterior communicating  artery origins are normal. The left posterior communicating artery is diminutive or absent. Normal carotid termini. Dominant left and diminutive right ACA A1 segments. Anterior communicating artery and bilateral ACA branches are normal. Left MCA M1 segment, bifurcation, and left MCA branches are within normal limits. Right MCA M1 segment, trifurcation and right MCA branches are within normal limits. Venous sinuses: Patent. Anatomic variants: Dominant left vertebral artery. Dominant left ACA A1 segment. Small fenestration in the right vertebral artery V4 segment. Fetal type right PCA origin. Delayed phase: No abnormal enhancement identified. Stable CT appearance of the brain. IMPRESSION: 1. Negative for age CTA head and neck. Minimal carotid artery plaque with no stenosis, and no circle of Willis branch occlusion identified. 2. Stable CT appearance of the brain. 3. No acute findings identified in the neck. Electronically Signed   By: Odessa Fleming M.D.   On: 12/03/2015 12:33   I have personally reviewed and evaluated these images and lab results as part of my medical decision-making.   EKG Interpretation None      MDM   Final diagnoses:  Stroke (cerebrum) (HCC)  Drug-induced Parkinson's disease (HCC)    55 yo M with a chief complaint of slurred speech and left-sided weakness. This occurred acutely and are different than from his baseline code stroke was initiated. No significant findings on CT. Neurology evaluated at bedside feel this is likely drug-induced parkinsonism. Patient is able to ambulate without difficulty on reassessment. His valproic acid level is elevated. We'll have him skip his next dose and call his primary provider.  1:43 PM:  I have discussed the diagnosis/risks/treatment options with the patient and family and believe the pt to be eligible for discharge home to follow-up with PCP. We also discussed returning to the ED immediately if new or worsening sx occur. We discussed the sx which  are most concerning (e.g., sudden worsening pain, fever, inability to tolerate by mouth) that necessitate immediate return. Medications administered to the patient during their visit and any new prescriptions provided to the patient are listed below.  Medications given during this visit Medications  iopamidol (ISOVUE-370) 76 % injection 100 mL (100 mLs Intravenous Contrast Given 12/03/15 1155)    New Prescriptions   No medications on file    The patient appears reasonably screen and/or stabilized for discharge and I doubt any other medical condition or other [facility] requiring further screening, evaluation, or treatment in the ED at this time prior to discharge.      Melene Plan, DO 12/03/15 1343

## 2016-03-04 ENCOUNTER — Emergency Department (HOSPITAL_COMMUNITY)
Admission: EM | Admit: 2016-03-04 | Discharge: 2016-03-06 | Disposition: A | Discharge status: Home / Self Care | Payer: Medicare Other | Attending: Emergency Medicine | Admitting: Emergency Medicine

## 2016-03-04 ENCOUNTER — Encounter (HOSPITAL_COMMUNITY): Payer: Self-pay | Admitting: Emergency Medicine

## 2016-03-04 ENCOUNTER — Emergency Department (HOSPITAL_COMMUNITY): Payer: Medicare Other

## 2016-03-04 DIAGNOSIS — Y999 Unspecified external cause status: Secondary | ICD-10-CM | POA: Diagnosis not present

## 2016-03-04 DIAGNOSIS — R4182 Altered mental status, unspecified: Secondary | ICD-10-CM | POA: Diagnosis present

## 2016-03-04 DIAGNOSIS — I10 Essential (primary) hypertension: Secondary | ICD-10-CM | POA: Insufficient documentation

## 2016-03-04 DIAGNOSIS — S022XXB Fracture of nasal bones, initial encounter for open fracture: Secondary | ICD-10-CM | POA: Insufficient documentation

## 2016-03-04 DIAGNOSIS — S022XXA Fracture of nasal bones, initial encounter for closed fracture: Secondary | ICD-10-CM

## 2016-03-04 DIAGNOSIS — Y929 Unspecified place or not applicable: Secondary | ICD-10-CM | POA: Diagnosis not present

## 2016-03-04 DIAGNOSIS — Z791 Long term (current) use of non-steroidal anti-inflammatories (NSAID): Secondary | ICD-10-CM | POA: Diagnosis not present

## 2016-03-04 DIAGNOSIS — F6089 Other specific personality disorders: Secondary | ICD-10-CM | POA: Diagnosis not present

## 2016-03-04 DIAGNOSIS — R4689 Other symptoms and signs involving appearance and behavior: Secondary | ICD-10-CM

## 2016-03-04 DIAGNOSIS — Z79899 Other long term (current) drug therapy: Secondary | ICD-10-CM | POA: Insufficient documentation

## 2016-03-04 DIAGNOSIS — E039 Hypothyroidism, unspecified: Secondary | ICD-10-CM | POA: Diagnosis not present

## 2016-03-04 DIAGNOSIS — Z87891 Personal history of nicotine dependence: Secondary | ICD-10-CM | POA: Diagnosis not present

## 2016-03-04 DIAGNOSIS — W19XXXA Unspecified fall, initial encounter: Secondary | ICD-10-CM | POA: Insufficient documentation

## 2016-03-04 DIAGNOSIS — R450 Nervousness: Secondary | ICD-10-CM | POA: Diagnosis not present

## 2016-03-04 DIAGNOSIS — G4700 Insomnia, unspecified: Secondary | ICD-10-CM | POA: Diagnosis not present

## 2016-03-04 DIAGNOSIS — Y9389 Activity, other specified: Secondary | ICD-10-CM | POA: Diagnosis not present

## 2016-03-04 LAB — VALPROIC ACID LEVEL: Valproic Acid Lvl: 65 ug/mL (ref 50.0–100.0)

## 2016-03-04 LAB — CBC WITH DIFFERENTIAL/PLATELET
Basophils Absolute: 0 10*3/uL (ref 0.0–0.1)
Basophils Relative: 0 %
EOS PCT: 1 %
Eosinophils Absolute: 0 10*3/uL (ref 0.0–0.7)
HCT: 30.2 % — ABNORMAL LOW (ref 39.0–52.0)
Hemoglobin: 10.3 g/dL — ABNORMAL LOW (ref 13.0–17.0)
LYMPHS ABS: 1.4 10*3/uL (ref 0.7–4.0)
LYMPHS PCT: 17 %
MCH: 28.9 pg (ref 26.0–34.0)
MCHC: 34.1 g/dL (ref 30.0–36.0)
MCV: 84.6 fL (ref 78.0–100.0)
MONO ABS: 1.1 10*3/uL — AB (ref 0.1–1.0)
MONOS PCT: 14 %
Neutro Abs: 5.4 10*3/uL (ref 1.7–7.7)
Neutrophils Relative %: 68 %
PLATELETS: 103 10*3/uL — AB (ref 150–400)
RBC: 3.57 MIL/uL — ABNORMAL LOW (ref 4.22–5.81)
RDW: 13.7 % (ref 11.5–15.5)
WBC: 7.9 10*3/uL (ref 4.0–10.5)

## 2016-03-04 LAB — URINALYSIS, ROUTINE W REFLEX MICROSCOPIC
Bilirubin Urine: NEGATIVE
GLUCOSE, UA: NEGATIVE mg/dL
Hgb urine dipstick: NEGATIVE
KETONES UR: NEGATIVE mg/dL
LEUKOCYTES UA: NEGATIVE
Nitrite: NEGATIVE
PH: 7 (ref 5.0–8.0)
Protein, ur: NEGATIVE mg/dL
Specific Gravity, Urine: 1.006 (ref 1.005–1.030)

## 2016-03-04 LAB — COMPREHENSIVE METABOLIC PANEL
ALT: 32 U/L (ref 17–63)
AST: 46 U/L — ABNORMAL HIGH (ref 15–41)
Albumin: 4 g/dL (ref 3.5–5.0)
Alkaline Phosphatase: 68 U/L (ref 38–126)
Anion gap: 5 (ref 5–15)
BUN: 6 mg/dL (ref 6–20)
CHLORIDE: 103 mmol/L (ref 101–111)
CO2: 31 mmol/L (ref 22–32)
CREATININE: 0.65 mg/dL (ref 0.61–1.24)
Calcium: 9.1 mg/dL (ref 8.9–10.3)
Glucose, Bld: 76 mg/dL (ref 65–99)
POTASSIUM: 3.5 mmol/L (ref 3.5–5.1)
Sodium: 139 mmol/L (ref 135–145)
Total Bilirubin: 1 mg/dL (ref 0.3–1.2)
Total Protein: 7 g/dL (ref 6.5–8.1)

## 2016-03-04 LAB — RAPID URINE DRUG SCREEN, HOSP PERFORMED
Amphetamines: NOT DETECTED
Barbiturates: NOT DETECTED
Benzodiazepines: NOT DETECTED
COCAINE: NOT DETECTED
OPIATES: NOT DETECTED
TETRAHYDROCANNABINOL: NOT DETECTED

## 2016-03-04 MED ORDER — MIRTAZAPINE 30 MG PO TABS
15.0000 mg | ORAL_TABLET | Freq: Every day | ORAL | Status: DC
  Administered 2016-03-04 – 2016-03-05 (×2): 15 mg via ORAL
  Filled 2016-03-04 (×2): qty 1

## 2016-03-04 MED ORDER — RISPERIDONE 1 MG PO TABS: 1.0000 mg | ORAL_TABLET | Freq: Two times a day (BID) | ORAL | Status: DC

## 2016-03-04 MED ORDER — NICOTINE 21 MG/24HR TD PT24
21.0000 mg | MEDICATED_PATCH | Freq: Every day | TRANSDERMAL | Status: DC
Start: 2016-03-04 — End: 2016-03-06
  Filled 2016-03-04: qty 1

## 2016-03-04 MED ORDER — RISPERIDONE 2 MG PO TABS
2.0000 mg | ORAL_TABLET | Freq: Every day | ORAL | Status: DC
  Administered 2016-03-04 – 2016-03-06 (×3): 2 mg via ORAL
  Filled 2016-03-04 (×3): qty 1

## 2016-03-04 MED ORDER — IBUPROFEN 200 MG PO TABS
600.0000 mg | ORAL_TABLET | Freq: Three times a day (TID) | ORAL | Status: DC | PRN
  Administered 2016-03-06 (×2): 600 mg via ORAL
  Filled 2016-03-04 (×2): qty 3

## 2016-03-04 MED ORDER — ACETAMINOPHEN 325 MG PO TABS
650.0000 mg | ORAL_TABLET | ORAL | Status: DC | PRN
  Administered 2016-03-05: 650 mg via ORAL
  Filled 2016-03-04 (×2): qty 2

## 2016-03-04 MED ORDER — SODIUM CHLORIDE 0.9 % IV BOLUS (SEPSIS)
500.0000 mL | Freq: Once | INTRAVENOUS | Status: AC
  Administered 2016-03-04: 500 mL via INTRAVENOUS

## 2016-03-04 MED ORDER — ZIPRASIDONE MESYLATE 20 MG IM SOLR
20.0000 mg | Freq: Once | INTRAMUSCULAR | Status: AC
  Administered 2016-03-04: 20 mg via INTRAMUSCULAR

## 2016-03-04 MED ORDER — ATORVASTATIN CALCIUM 40 MG PO TABS
40.0000 mg | ORAL_TABLET | Freq: Every day | ORAL | Status: DC
  Administered 2016-03-04: 40 mg via ORAL
  Filled 2016-03-04 (×2): qty 1

## 2016-03-04 MED ORDER — LORAZEPAM 2 MG/ML IJ SOLN
2.0000 mg | Freq: Once | INTRAMUSCULAR | Status: AC
  Administered 2016-03-04: 2 mg via INTRAVENOUS
  Filled 2016-03-04: qty 1

## 2016-03-04 MED ORDER — ACETAMINOPHEN 500 MG PO TABS: 500.0000 mg | ORAL_TABLET | Freq: Four times a day (QID) | ORAL | Status: DC | PRN

## 2016-03-04 MED ORDER — ONDANSETRON HCL 4 MG PO TABS: 4.0000 mg | ORAL_TABLET | Freq: Three times a day (TID) | ORAL | Status: DC | PRN

## 2016-03-04 MED ORDER — PANTOPRAZOLE SODIUM 40 MG PO TBEC
40.0000 mg | DELAYED_RELEASE_TABLET | Freq: Every day | ORAL | Status: DC
  Administered 2016-03-04 – 2016-03-06 (×3): 40 mg via ORAL
  Filled 2016-03-04 (×3): qty 1

## 2016-03-04 MED ORDER — ALBUTEROL SULFATE HFA 108 (90 BASE) MCG/ACT IN AERS
2.0000 | INHALATION_SPRAY | Freq: Four times a day (QID) | RESPIRATORY_TRACT | Status: DC | PRN
  Administered 2016-03-05: 2 via RESPIRATORY_TRACT
  Filled 2016-03-04: qty 6.7

## 2016-03-04 MED ORDER — STERILE WATER FOR INJECTION IJ SOLN
INTRAMUSCULAR | Status: AC
  Administered 2016-03-04: 1.2 mL
  Filled 2016-03-04: qty 10

## 2016-03-04 MED ORDER — LEVOTHYROXINE SODIUM 25 MCG PO TABS
25.0000 ug | ORAL_TABLET | Freq: Every day | ORAL | Status: DC
  Administered 2016-03-05 – 2016-03-06 (×2): 25 ug via ORAL
  Filled 2016-03-04 (×3): qty 1

## 2016-03-04 MED ORDER — SERTRALINE HCL 50 MG PO TABS
100.0000 mg | ORAL_TABLET | Freq: Every day | ORAL | Status: DC
  Administered 2016-03-04 – 2016-03-06 (×3): 100 mg via ORAL
  Filled 2016-03-04 (×3): qty 2

## 2016-03-04 MED ORDER — DIVALPROEX SODIUM ER 500 MG PO TB24
500.0000 mg | ORAL_TABLET | Freq: Every morning | ORAL | Status: DC
  Administered 2016-03-05: 500 mg via ORAL
  Filled 2016-03-04: qty 1

## 2016-03-04 MED ORDER — ZOLPIDEM TARTRATE 5 MG PO TABS
5.0000 mg | ORAL_TABLET | Freq: Every evening | ORAL | Status: DC | PRN
  Administered 2016-03-05: 5 mg via ORAL
  Filled 2016-03-04: qty 1

## 2016-03-04 MED ORDER — ZIPRASIDONE MESYLATE 20 MG IM SOLR
INTRAMUSCULAR | Status: AC
  Filled 2016-03-04: qty 20

## 2016-03-04 MED ORDER — OXCARBAZEPINE 150 MG PO TABS
150.0000 mg | ORAL_TABLET | Freq: Two times a day (BID) | ORAL | Status: DC
  Administered 2016-03-04 – 2016-03-06 (×4): 150 mg via ORAL
  Filled 2016-03-04 (×4): qty 1

## 2016-03-04 MED ORDER — TAMSULOSIN HCL 0.4 MG PO CAPS
0.4000 mg | ORAL_CAPSULE | Freq: Every day | ORAL | Status: DC
  Administered 2016-03-04 – 2016-03-05 (×2): 0.4 mg via ORAL
  Filled 2016-03-04 (×3): qty 1

## 2016-03-04 MED ORDER — ALUM & MAG HYDROXIDE-SIMETH 200-200-20 MG/5ML PO SUSP: 30.0000 mL | ORAL | Status: DC | PRN

## 2016-03-04 MED ORDER — HYDROXYZINE HCL 25 MG PO TABS
50.0000 mg | ORAL_TABLET | Freq: Two times a day (BID) | ORAL | Status: DC | PRN
  Administered 2016-03-05 – 2016-03-06 (×3): 50 mg via ORAL
  Filled 2016-03-04 (×3): qty 2

## 2016-03-04 MED ORDER — LORAZEPAM 1 MG PO TABS
1.0000 mg | ORAL_TABLET | Freq: Three times a day (TID) | ORAL | Status: DC | PRN
  Administered 2016-03-05 – 2016-03-06 (×3): 1 mg via ORAL
  Filled 2016-03-04 (×3): qty 1

## 2016-03-04 NOTE — ED Notes (Signed)
Pt states he normally takes his risperidone at 2100 and would like to take it then
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
## 2016-03-04 NOTE — Progress Notes (Signed)
Patient noted to have Medicare/Medicaid [place] Access insurance without a pcp.  EDCM went to speak to patient at bedside.  Patient stated, "I ain't seen no doctor, they sent me here." Patient with pmhx of MR. Per chart review, patient with [facility] in independent living situation but has assistance from staff. There is a large white binder in patient's room with patient's medication lists, consent to treatment forms etc., from his facility. EDCM found name of pcp at facility to be NP [place] Scrapeakela Anderson [place].

## 2016-03-04 NOTE — Progress Notes (Signed)
CSW attempted to contact patients guardian Jeff RadonHeather Foster [phone]. CSW left voicemail for call back.   Stacy GardnerErin Moishe Schellenberg, LCSWA Clinical Social Worker [phone]
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
## 2016-03-04 NOTE — Progress Notes (Signed)
CSW received call back from patient's guardian Sander RadonHeather Hicks [phone] and informed her of patient's reason for hospital visit. Sander RadonHeather Hicks is a guardian through DSS and will be consulting her director for further assistance.   Stacy GardnerErin Blaiden Werth, LCSWA Clinical Social Worker [phone]

## 2016-03-04 NOTE — ED Notes (Signed)
Bed: [facility] Expected date:  Expected time:  Means of arrival:  Comments: 
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
## 2016-03-04 NOTE — ED Notes (Signed)
SW consulted-Jeff Foster to call PA-number given
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
## 2016-03-04 NOTE — ED Notes (Signed)
Per Child psychotherapistsocial worker, the guardian stated that they need to have a new caregiver for pt prior to dc. The group home is aware of this and is working toward this.

## 2016-03-05 DIAGNOSIS — S022XXB Fracture of nasal bones, initial encounter for open fracture: Secondary | ICD-10-CM | POA: Diagnosis not present

## 2016-03-05 DIAGNOSIS — R450 Nervousness: Secondary | ICD-10-CM

## 2016-03-05 DIAGNOSIS — F6089 Other specific personality disorders: Secondary | ICD-10-CM

## 2016-03-05 MED ORDER — STERILE WATER FOR INJECTION IJ SOLN
INTRAMUSCULAR | Status: AC
  Administered 2016-03-06: 09:00:00
  Filled 2016-03-05: qty 10

## 2016-03-05 MED ORDER — LORAZEPAM 2 MG/ML IJ SOLN
2.0000 mg | Freq: Once | INTRAMUSCULAR | Status: AC
  Administered 2016-03-05: 2 mg via INTRAMUSCULAR
  Filled 2016-03-05: qty 1

## 2016-03-05 MED ORDER — ZIPRASIDONE MESYLATE 20 MG IM SOLR
20.0000 mg | Freq: Once | INTRAMUSCULAR | Status: AC
  Administered 2016-03-05: 20 mg via INTRAMUSCULAR
  Filled 2016-03-05: qty 20

## 2016-03-05 MED ORDER — STERILE WATER FOR INJECTION IJ SOLN
INTRAMUSCULAR | Status: AC
  Administered 2016-03-05: 10 mL
  Filled 2016-03-05: qty 10

## 2016-03-05 MED ORDER — STERILE WATER FOR INJECTION IJ SOLN
INTRAMUSCULAR | Status: AC
  Filled 2016-03-05: qty 10

## 2016-03-05 MED ORDER — LORAZEPAM 1 MG PO TABS
1.0000 mg | ORAL_TABLET | Freq: Once | ORAL | Status: AC
  Administered 2016-03-05: 1 mg via ORAL
  Filled 2016-03-05: qty 1

## 2016-03-05 MED ORDER — DIVALPROEX SODIUM ER 500 MG PO TB24
1000.0000 mg | ORAL_TABLET | Freq: Every morning | ORAL | Status: DC
  Administered 2016-03-06: 1000 mg via ORAL
  Filled 2016-03-05: qty 2

## 2016-03-05 MED ORDER — ZIPRASIDONE MESYLATE 20 MG IM SOLR
INTRAMUSCULAR | Status: AC
  Administered 2016-03-05: 20 mg
  Filled 2016-03-05: qty 20
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
## 2016-03-05 NOTE — Progress Notes (Signed)
CSW staffed patient's case with Psychiatrist who states patient will remain overnight.   CSW spoke with Jeff RadonHeather Foster, [place] [facility] to inform her that patient will remain overnight per Psychiatrist. She stated she wanted to be informed when patient is discharged. Should patient be discharged over the weekend, please make an attempt to contact the Guardian or contact the DSS After Hours Social Worker to provide disposition information.   CSW spoke with Jeff Foster, Owner, [facility]) to inform him that patient will remain as a patient overnight. CSW also informed patient's new staff member, Jeff Foster working with him that is currently with patient in the ED. CSW did not inform patient in efforts of preventing more agitation.     Jeff Foster, Owner, [facility] [phone] Jeff SeverinHeather Foster, Guardian, [place] [facility] [phone]   Jeff PaddyLaVonia Libni Foster, LCSWA [phone] ED CSW 03/05/2016 1:01 PM

## 2016-03-05 NOTE — ED Notes (Signed)
Social work at bedside conversing with patient.

## 2016-03-05 NOTE — ED Notes (Signed)
Gave patient bed bath with sitter.
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
## 2016-03-05 NOTE — BH Assessment (Signed)
[facility] Assessment Progress Note  03/05/16: Patient will be re-evaluated in the a.m      

## 2016-03-05 NOTE — ED Notes (Signed)
Patient trying to get out of bed. Hitting and kicking at staff.

## 2016-03-05 NOTE — ED Notes (Signed)
Bed: [id number] Expected date:  Expected time:  Means of arrival:  Comments: Room 22

## 2016-03-05 NOTE — ED Notes (Signed)
[name], the owner of the group home here again to visit patient.

## 2016-03-05 NOTE — Progress Notes (Addendum)
[facility] staff at pt bedside confirms pt pcp is Nelly Routakia Anderson  EPIC updated  Pt is now sleeping Snoring in bed

## 2016-03-05 NOTE — Progress Notes (Signed)
Pt with a Jeff Foster from [facility] listed as pcp per [employer] [place] access response hx This doctor not found in [employer] search Cm stopped by pt room to ask him who his pcp is he confirms his has one but CM unable to understand him Pt noted with loud tone of voice CM encouraged pt to speak in his in side lower tone voice Pt did so but again began speaking in a louder tone.  CM unable to understand at intervals Cm understood pt to state he had a lawyer, wanted to leave and someone was in room with him ( no one was noted in room with pt as Cm spoke with him but there was a notebook and bag on the chair next to pt)  Cm concluded the conversation and left pt room Cm was standing at the nursing station speaking with the nurse who was informing CM pt did have a male from the group home that had been with him in his room and RN also had difficulty understanding the pt. The pt noted to come to the nursing station mumbling "what you gone do now" and picked up a container of wipes and another container. RN called security, ED PA/NP requested pt go back to his room and security escorted pt back to his room

## 2016-03-06 ENCOUNTER — Encounter (HOSPITAL_COMMUNITY): Payer: Self-pay | Admitting: Registered Nurse

## 2016-03-06 DIAGNOSIS — R4689 Other symptoms and signs involving appearance and behavior: Secondary | ICD-10-CM

## 2016-03-06 DIAGNOSIS — Z791 Long term (current) use of non-steroidal anti-inflammatories (NSAID): Secondary | ICD-10-CM | POA: Diagnosis not present

## 2016-03-06 DIAGNOSIS — Z79899 Other long term (current) drug therapy: Secondary | ICD-10-CM

## 2016-03-06 DIAGNOSIS — F6089 Other specific personality disorders: Secondary | ICD-10-CM | POA: Diagnosis not present

## 2016-03-06 DIAGNOSIS — Z87891 Personal history of nicotine dependence: Secondary | ICD-10-CM | POA: Diagnosis not present

## 2016-03-06 DIAGNOSIS — S022XXB Fracture of nasal bones, initial encounter for open fracture: Secondary | ICD-10-CM | POA: Diagnosis not present

## 2016-03-06 MED ORDER — ALBUTEROL SULFATE (2.5 MG/3ML) 0.083% IN NEBU
2.5000 mg | INHALATION_SOLUTION | Freq: Once | RESPIRATORY_TRACT | Status: AC
  Administered 2016-03-06: 2.5 mg via RESPIRATORY_TRACT
  Filled 2016-03-06: qty 3

## 2016-03-06 MED ORDER — STERILE WATER FOR INJECTION IJ SOLN
INTRAMUSCULAR | Status: AC
  Filled 2016-03-06: qty 10

## 2016-03-06 MED ORDER — ZIPRASIDONE MESYLATE 20 MG IM SOLR
20.0000 mg | Freq: Once | INTRAMUSCULAR | Status: AC
  Administered 2016-03-06: 20 mg via INTRAMUSCULAR
  Filled 2016-03-06: qty 20

## 2016-03-06 MED ORDER — SERTRALINE HCL 100 MG PO TABS: 100.0000 mg | ORAL_TABLET | Freq: Every day | ORAL | 0 refills | Status: DC

## 2016-03-06 MED ORDER — DIVALPROEX SODIUM ER 500 MG PO TB24: 1000.0000 mg | ORAL_TABLET | Freq: Every morning | ORAL | 0 refills | Status: DC

## 2016-03-06 NOTE — ED Notes (Signed)
Restraints discontinued. Pt complaint and exhibits behavior for discontinuation. VWilliams,rn.
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
## 2016-03-06 NOTE — [facility] Suicide Risk Assessment (Cosign Needed)
Suicide Risk Assessment  Discharge Assessment   [facility]   Principal Problem: Aggressive behavior, adult Discharge Diagnoses:  Patient Active Problem List   Diagnosis Date Noted  . Aggressive behavior, adult [F60.89] 03/06/2016  . Essential hypertension [I10]   . Post-ictal confusion [F05] 10/27/2015  . Hypothyroidism [E03.9] 10/27/2015  . Thrombocytopenia ([facility]) [D69.6] 10/27/2015  . Acute respiratory failure with hypoxia ([facility]) [J96.01] 10/27/2015  . Seizure ([facility]) [R56.9] 10/27/2015  . Psychosis [F29] 10/27/2015  . Normocytic anemia [D64.9] 10/27/2015  . DNR (do not resuscitate) [Z66] 07/25/2015  . Aspiration pneumonia ([facility]) [J69.0] 07/25/2015  . Palliative care encounter [Z51.5] 07/25/2015  . Dysphagia [R13.10]   . Altered mental status [R41.82]   . UTI (lower urinary tract infection) [N39.0] 07/18/2015  . Cognitive decline [R41.89] 07/18/2015  . Acute encephalopathy [G93.40] 07/18/2015  . Mental retardation [F79] 07/18/2015  . Hypertension [I10] 07/18/2015  . History of CVA (cerebrovascular accident) [Z86.73] 07/18/2015  . Encephalopathy acute [G93.40] 07/18/2015  . Dehydration [E86.0]     Total Time spent with patient: 20 minutes  Musculoskeletal: Strength & Muscle Tone: within normal limits Gait & Station: normal Patient leans: N/A  Psychiatric Specialty Exam:   Blood pressure 145/74, pulse (!) 51, temperature 97.8 F (36.6 C), temperature source Oral, resp. rate 24, height 6\' 2"  (1.88 m), weight 99.3 kg (219 lb), SpO2 96 %.Body mass index is 28.12 kg/m.   General Appearance: Fairly Groomed  Eye Contact:  Fair  Speech:  variable, loud at times, difficult to understand at times   Volume:  Increased  Mood:  Anxious  Affect:  Labile  Thought Process:  Linear  Orientation:  Other:  alert and attentive   Thought Content:  patient denies hallucinations, no delusions expressed   Suicidal Thoughts:  No patient denies suicidal ideations,  denies self injurious ideations   Homicidal Thoughts:  No  Memory:  recent and remote fair   Judgement:  Impaired  Insight:  Lacking  Psychomotor Activity:  Normal  Concentration:  Concentration: Fair and Attention Span: Fair  Recall:  [employer]  Fund of Knowledge:  Fair  Language:  Poor  Akathisia:  No  Handed:  Right  AIMS (if indicated):     Assets:  Resilience  ADL's:  Impaired  Cognition:  Alert and attentive   Sleep:       Mental Status Per Nursing Assessment::   On Admission:     Demographic Factors:  NA  Loss Factors: Decrease in vocational status  Historical Factors: Impulsivity  Risk Reduction Factors:   Positive social support  Continued Clinical Symptoms:  Developmental Disorder  Cognitive Features That Contribute To Risk:  Closed-mindedness and Loss of executive function    Suicide Risk:  Minimal: No identifiable suicidal ideation.  Patients presenting with no risk factors but with morbid ruminations; may be classified as minimal risk based on the severity of the depressive symptoms  Follow-up Information    Schedule an appointment as soon as possible for a visit  with Serena ColonelOSEN, JEFRY, MD.   Specialty:  Otolaryngology Contact information: [address] [place] [address] [phone]           Plan Of Care/Follow-up recommendations:  Activity:  As tolerated Diet:  As tolerated  Jahki Witham, NP 03/06/2016, 1:37 PM

## 2016-03-06 NOTE — ED Notes (Signed)
Pt discharged to home. DC instructions given with private caregiver at bedside. Prescriptions x 2 also given to sitter. Sitter reminded to call and schedule pt's f/u appt on Monday. Caregiver verbalized understanding. Pt left unit ambulatorily accompanied by caregiver Erin Sons(Theresa Veleria Barnhardt) and nurse tech [facility](Quincess Hall). Left in stable condition. Pt attempted signing four times on signature device/panel; but no success. Pt then refused to attempt to sign anymore after those many attempts . Copy of AVS given. VWilliams,rn.

## 2016-03-10 ENCOUNTER — Emergency Department (HOSPITAL_COMMUNITY): Payer: Medicare Other

## 2016-03-10 ENCOUNTER — Encounter (HOSPITAL_COMMUNITY): Payer: Self-pay | Admitting: Emergency Medicine

## 2016-03-10 ENCOUNTER — Inpatient Hospital Stay (HOSPITAL_COMMUNITY)
Admission: EM | Admit: 2016-03-10 | Discharge: 2016-03-12 | DRG: 696 | Disposition: A | Discharge status: Home / Self Care | Payer: Medicare Other | Attending: Internal Medicine | Admitting: Internal Medicine

## 2016-03-10 DIAGNOSIS — Z8673 Personal history of transient ischemic attack (TIA), and cerebral infarction without residual deficits: Secondary | ICD-10-CM

## 2016-03-10 DIAGNOSIS — S32009A Unspecified fracture of unspecified lumbar vertebra, initial encounter for closed fracture: Secondary | ICD-10-CM

## 2016-03-10 DIAGNOSIS — E039 Hypothyroidism, unspecified: Secondary | ICD-10-CM | POA: Diagnosis present

## 2016-03-10 DIAGNOSIS — F23 Brief psychotic disorder: Secondary | ICD-10-CM | POA: Diagnosis present

## 2016-03-10 DIAGNOSIS — S32049A Unspecified fracture of fourth lumbar vertebra, initial encounter for closed fracture: Secondary | ICD-10-CM | POA: Diagnosis present

## 2016-03-10 DIAGNOSIS — T43595A Adverse effect of other antipsychotics and neuroleptics, initial encounter: Secondary | ICD-10-CM | POA: Diagnosis present

## 2016-03-10 DIAGNOSIS — R451 Restlessness and agitation: Secondary | ICD-10-CM | POA: Diagnosis present

## 2016-03-10 DIAGNOSIS — Z87440 Personal history of urinary (tract) infections: Secondary | ICD-10-CM

## 2016-03-10 DIAGNOSIS — R569 Unspecified convulsions: Secondary | ICD-10-CM

## 2016-03-10 DIAGNOSIS — R4689 Other symptoms and signs involving appearance and behavior: Secondary | ICD-10-CM | POA: Diagnosis present

## 2016-03-10 DIAGNOSIS — Z79899 Other long term (current) drug therapy: Secondary | ICD-10-CM

## 2016-03-10 DIAGNOSIS — S2232XA Fracture of one rib, left side, initial encounter for closed fracture: Secondary | ICD-10-CM

## 2016-03-10 DIAGNOSIS — W19XXXA Unspecified fall, initial encounter: Secondary | ICD-10-CM

## 2016-03-10 DIAGNOSIS — I4581 Long QT syndrome: Secondary | ICD-10-CM | POA: Diagnosis present

## 2016-03-10 DIAGNOSIS — R339 Retention of urine, unspecified: Principal | ICD-10-CM | POA: Diagnosis present

## 2016-03-10 DIAGNOSIS — Z781 Physical restraint status: Secondary | ICD-10-CM

## 2016-03-10 DIAGNOSIS — D649 Anemia, unspecified: Secondary | ICD-10-CM

## 2016-03-10 DIAGNOSIS — F419 Anxiety disorder, unspecified: Secondary | ICD-10-CM | POA: Diagnosis present

## 2016-03-10 DIAGNOSIS — D696 Thrombocytopenia, unspecified: Secondary | ICD-10-CM | POA: Diagnosis present

## 2016-03-10 DIAGNOSIS — G40909 Epilepsy, unspecified, not intractable, without status epilepticus: Secondary | ICD-10-CM | POA: Diagnosis present

## 2016-03-10 DIAGNOSIS — I10 Essential (primary) hypertension: Secondary | ICD-10-CM | POA: Diagnosis present

## 2016-03-10 DIAGNOSIS — F79 Unspecified intellectual disabilities: Secondary | ICD-10-CM

## 2016-03-10 DIAGNOSIS — Z87891 Personal history of nicotine dependence: Secondary | ICD-10-CM

## 2016-03-10 DIAGNOSIS — Z91010 Allergy to peanuts: Secondary | ICD-10-CM

## 2016-03-10 HISTORY — DX: Unspecified intellectual disabilities: F79

## 2016-03-10 HISTORY — DX: Unspecified psychosis not due to a substance or known physiological condition: F29

## 2016-03-10 HISTORY — DX: Paranoid schizophrenia: F20.0

## 2016-03-10 HISTORY — DX: Intermittent explosive disorder: F63.81

## 2016-03-10 LAB — CBC WITH DIFFERENTIAL/PLATELET
BASOS ABS: 0 10*3/uL (ref 0.0–0.1)
BASOS PCT: 0 %
Eosinophils Absolute: 0.2 10*3/uL (ref 0.0–0.7)
Eosinophils Relative: 3 %
HEMATOCRIT: 31.3 % — AB (ref 39.0–52.0)
Hemoglobin: 10.8 g/dL — ABNORMAL LOW (ref 13.0–17.0)
Lymphocytes Relative: 30 %
Lymphs Abs: 2.1 10*3/uL (ref 0.7–4.0)
MCH: 28.6 pg (ref 26.0–34.0)
MCHC: 34.5 g/dL (ref 30.0–36.0)
MCV: 82.8 fL (ref 78.0–100.0)
MONO ABS: 0.8 10*3/uL (ref 0.1–1.0)
Monocytes Relative: 12 %
NEUTROS ABS: 3.8 10*3/uL (ref 1.7–7.7)
Neutrophils Relative %: 56 %
PLATELETS: 109 10*3/uL — AB (ref 150–400)
RBC: 3.78 MIL/uL — AB (ref 4.22–5.81)
RDW: 13.5 % (ref 11.5–15.5)
WBC: 6.9 10*3/uL (ref 4.0–10.5)

## 2016-03-10 LAB — URINALYSIS, ROUTINE W REFLEX MICROSCOPIC
BILIRUBIN URINE: NEGATIVE
Glucose, UA: NEGATIVE mg/dL
Hgb urine dipstick: NEGATIVE
KETONES UR: NEGATIVE mg/dL
LEUKOCYTES UA: NEGATIVE
NITRITE: NEGATIVE
PROTEIN: NEGATIVE mg/dL
Specific Gravity, Urine: 1.019 (ref 1.005–1.030)
pH: 7.5 (ref 5.0–8.0)

## 2016-03-10 LAB — COMPREHENSIVE METABOLIC PANEL
ALBUMIN: 4.1 g/dL (ref 3.5–5.0)
ALT: 23 U/L (ref 17–63)
AST: 32 U/L (ref 15–41)
Alkaline Phosphatase: 90 U/L (ref 38–126)
Anion gap: 7 (ref 5–15)
BILIRUBIN TOTAL: 1 mg/dL (ref 0.3–1.2)
BUN: 7 mg/dL (ref 6–20)
CHLORIDE: 102 mmol/L (ref 101–111)
CO2: 26 mmol/L (ref 22–32)
Calcium: 8.9 mg/dL (ref 8.9–10.3)
Creatinine, Ser: 0.66 mg/dL (ref 0.61–1.24)
GFR calc Af Amer: >60 mL/min (ref >60)
GFR calc non Af Amer: >60 mL/min (ref >60)
GLUCOSE: 110 mg/dL — AB (ref 65–99)
POTASSIUM: 3.5 mmol/L (ref 3.5–5.1)
Sodium: 135 mmol/L (ref 135–145)
Total Protein: 7.2 g/dL (ref 6.5–8.1)

## 2016-03-10 LAB — RAPID URINE DRUG SCREEN, HOSP PERFORMED
AMPHETAMINES: NOT DETECTED
BARBITURATES: NOT DETECTED
BENZODIAZEPINES: NOT DETECTED
Cocaine: NOT DETECTED
Opiates: NOT DETECTED
Tetrahydrocannabinol: NOT DETECTED

## 2016-03-10 LAB — ETHANOL: Alcohol, Ethyl (B): <5 mg/dL (ref <5)

## 2016-03-10 LAB — VALPROIC ACID LEVEL: VALPROIC ACID LVL: 69 ug/mL (ref 50.0–100.0)

## 2016-03-10 MED ORDER — OXCARBAZEPINE 150 MG PO TABS
150.0000 mg | ORAL_TABLET | Freq: Two times a day (BID) | ORAL | Status: DC
  Administered 2016-03-11 – 2016-03-12 (×4): 150 mg via ORAL
  Filled 2016-03-10 (×5): qty 1

## 2016-03-10 MED ORDER — ZIPRASIDONE MESYLATE 20 MG IM SOLR
10.0000 mg | Freq: Once | INTRAMUSCULAR | Status: AC
  Administered 2016-03-10: 10 mg via INTRAMUSCULAR
  Filled 2016-03-10: qty 20

## 2016-03-10 MED ORDER — LORAZEPAM 2 MG/ML IJ SOLN: 1.0000 mg | Freq: Once | INTRAMUSCULAR | Status: DC

## 2016-03-10 MED ORDER — TRAZODONE HCL 100 MG PO TABS
100.0000 mg | ORAL_TABLET | Freq: Every day | ORAL | Status: DC
  Administered 2016-03-11 (×2): 100 mg via ORAL
  Filled 2016-03-10 (×3): qty 1

## 2016-03-10 MED ORDER — IOPAMIDOL (ISOVUE-300) INJECTION 61%
100.0000 mL | Freq: Once | INTRAVENOUS | Status: AC | PRN
  Administered 2016-03-10: 100 mL via INTRAVENOUS

## 2016-03-10 MED ORDER — HALOPERIDOL LACTATE 5 MG/ML IJ SOLN
5.0000 mg | Freq: Once | INTRAMUSCULAR | Status: AC
  Administered 2016-03-10: 5 mg via INTRAMUSCULAR
  Filled 2016-03-10: qty 1

## 2016-03-10 MED ORDER — LORAZEPAM 2 MG/ML IJ SOLN
2.0000 mg | Freq: Four times a day (QID) | INTRAMUSCULAR | Status: DC | PRN
  Administered 2016-03-11 – 2016-03-12 (×5): 2 mg via INTRAVENOUS
  Filled 2016-03-10 (×6): qty 1

## 2016-03-10 MED ORDER — LORAZEPAM 2 MG/ML IJ SOLN
INTRAMUSCULAR | Status: AC
  Filled 2016-03-10: qty 1

## 2016-03-10 MED ORDER — RISPERIDONE 1 MG PO TABS
1.0000 mg | ORAL_TABLET | Freq: Two times a day (BID) | ORAL | Status: DC
  Administered 2016-03-11 – 2016-03-12 (×4): 1 mg via ORAL
  Filled 2016-03-10 (×5): qty 1

## 2016-03-10 MED ORDER — ATORVASTATIN CALCIUM 40 MG PO TABS
40.0000 mg | ORAL_TABLET | Freq: Every day | ORAL | Status: DC
  Administered 2016-03-11 (×2): 40 mg via ORAL
  Filled 2016-03-10 (×2): qty 1

## 2016-03-10 MED ORDER — ALBUTEROL SULFATE (2.5 MG/3ML) 0.083% IN NEBU
3.0000 mL | INHALATION_SOLUTION | Freq: Four times a day (QID) | RESPIRATORY_TRACT | Status: DC | PRN
  Administered 2016-03-11: 3 mL via RESPIRATORY_TRACT
  Filled 2016-03-10: qty 3

## 2016-03-10 MED ORDER — LORAZEPAM 2 MG/ML IJ SOLN
2.0000 mg | Freq: Once | INTRAMUSCULAR | Status: AC
  Administered 2016-03-10: 2 mg via INTRAVENOUS

## 2016-03-10 MED ORDER — LEVOTHYROXINE SODIUM 25 MCG PO TABS
25.0000 ug | ORAL_TABLET | Freq: Every day | ORAL | Status: DC
  Administered 2016-03-11 – 2016-03-12 (×2): 25 ug via ORAL
  Filled 2016-03-10 (×2): qty 1

## 2016-03-10 MED ORDER — TAMSULOSIN HCL 0.4 MG PO CAPS
0.4000 mg | ORAL_CAPSULE | Freq: Every day | ORAL | Status: DC
  Administered 2016-03-11 (×2): 0.4 mg via ORAL
  Filled 2016-03-10 (×2): qty 1

## 2016-03-10 MED ORDER — STERILE WATER FOR INJECTION IJ SOLN
INTRAMUSCULAR | Status: AC
  Administered 2016-03-10: 10:00:00
  Filled 2016-03-10: qty 10

## 2016-03-10 MED ORDER — MIRTAZAPINE 30 MG PO TABS
15.0000 mg | ORAL_TABLET | Freq: Every day | ORAL | Status: DC
  Administered 2016-03-11 (×2): 15 mg via ORAL
  Filled 2016-03-10: qty 1
  Filled 2016-03-10 (×3): qty 0.5

## 2016-03-10 MED ORDER — HYDROXYZINE HCL 50 MG PO TABS
50.0000 mg | ORAL_TABLET | Freq: Two times a day (BID) | ORAL | Status: DC | PRN
  Filled 2016-03-10: qty 1

## 2016-03-10 MED ORDER — HYDROMORPHONE HCL 1 MG/ML IJ SOLN
1.0000 mg | INTRAMUSCULAR | Status: AC | PRN
  Administered 2016-03-10: 1 mg via INTRAVENOUS
  Filled 2016-03-10: qty 1

## 2016-03-10 MED ORDER — DIVALPROEX SODIUM ER 500 MG PO TB24
500.0000 mg | ORAL_TABLET | Freq: Every morning | ORAL | Status: DC
  Administered 2016-03-11 – 2016-03-12 (×2): 500 mg via ORAL
  Filled 2016-03-10 (×2): qty 1

## 2016-03-10 MED ORDER — ONDANSETRON HCL 4 MG/2ML IJ SOLN: 4.0000 mg | Freq: Three times a day (TID) | INTRAMUSCULAR | Status: AC | PRN

## 2016-03-10 MED ORDER — PANTOPRAZOLE SODIUM 40 MG PO TBEC
40.0000 mg | DELAYED_RELEASE_TABLET | Freq: Every day | ORAL | Status: DC
  Administered 2016-03-11 – 2016-03-12 (×2): 40 mg via ORAL
  Filled 2016-03-10 (×2): qty 1

## 2016-03-10 MED ORDER — ACETAMINOPHEN 500 MG PO TABS
500.0000 mg | ORAL_TABLET | Freq: Four times a day (QID) | ORAL | Status: DC | PRN
  Administered 2016-03-12 (×2): 500 mg via ORAL
  Filled 2016-03-10 (×2): qty 1

## 2016-03-10 MED ORDER — HALOPERIDOL LACTATE 5 MG/ML IJ SOLN: 5.0000 mg | Freq: Four times a day (QID) | INTRAMUSCULAR | Status: DC | PRN

## 2016-03-10 MED ORDER — LORAZEPAM 2 MG/ML IJ SOLN
1.0000 mg | Freq: Once | INTRAMUSCULAR | Status: AC
  Administered 2016-03-10: 1 mg via INTRAMUSCULAR
  Filled 2016-03-10: qty 1

## 2016-03-10 MED ORDER — ENOXAPARIN SODIUM 40 MG/0.4ML ~~LOC~~ SOLN
40.0000 mg | SUBCUTANEOUS | Status: DC
  Administered 2016-03-11: 40 mg via SUBCUTANEOUS
  Filled 2016-03-10: qty 0.4

## 2016-03-10 NOTE — ED Notes (Signed)
Patient transported to CT 
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
## 2016-03-10 NOTE — ED Notes (Signed)
Bed: [id number] Expected date:  Expected time:  Means of arrival:  Comments: Fall, no complaint

## 2016-03-10 NOTE — ED Notes (Signed)
Patient was resting comfortably, woke up requesting to use restroom. Staff assisted patient with using urinal, unsuccessfully. Patient now verbally aggressive, cursing at staff and hitting bed rails. Patient threw pillow and urinal. Attempted to redirect patient.
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
## 2016-03-10 NOTE — ED Notes (Signed)
Pulse ox placed back on patient's finger.

## 2016-03-10 NOTE — Progress Notes (Signed)
Call to medical director  LVM

## 2016-03-10 NOTE — Progress Notes (Signed)
Shift event: Pt admitted earlier today after fall with agitation and behavior changes at group home. Pt has extensive psych hx per chart and also MR. Pt received 10mg  haldol and 10mg  Geodon total during day shift.   Tonight, at beginning of shift, RN paged because pt was extremely agitated and OOB trying to pull TV off the wall. He was being aggressive towards staff by swinging, cursing, and throwing objects. Security and [employer] called and restrained pt in bed per NP orders. Ativan, total of 4mg  IM, given without change in behavior. NP spoke with psych on call who approved 10mg  Geodon. However, RN mistakenly gave 20mg . NP called pharmD who stated 24hr max dose is 40mg  (he has had 30mg  now) and we should watch for orthostatic hypotension, bradycardia, and mental status change. After Geodon, pt sleeping.  Pt placed on tele to monitor qtc. Sitter at bedside.  KJKG, NP [facility]
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
## 2016-03-10 NOTE — ED Notes (Signed)
Patient is still verbally aggressive, shaking bed rails. Speech is incomprehensible.
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
## 2016-03-10 NOTE — Progress Notes (Signed)
ED CM consulted by Joselyn Glassmanyler and EDP Roselyn BeringJ Knapp about disposition and admission possibility for pt  EDP consulted with medicine for med psych admission since stating pt is not a candidate for psych ED  Discussed with EDP stabilizing pt psych issues first ED CM left message consulting with medical director

## 2016-03-10 NOTE — Progress Notes (Addendum)
Patients legal guardian, Jeff RadonHeather Foster [phone], contacted CSW wanting an update on patient.   Stacy GardnerErin Calab Sachse, LCSWA Clinical Social Worker [phone]

## 2016-03-10 NOTE — Progress Notes (Signed)
CSW notified [facility]PS that patient was admitted to hospital.   Stacy GardnerErin Vernie Vinciguerra, [facility] S Hershey Medical CenterCSWA Clinical Social Worker [phone]

## 2016-03-10 NOTE — Progress Notes (Signed)
NP kirby contacted as patient becoming physically aggressive increasingly agitated. NP gave verbal order for 2mg  IM Ativan which was administered but electronic order was not placed. NP and Pharmacy aware. Dose was pulled from pyxis by this RN and administered by Charge Nurse Brayton Cavesom Ryan in the left deltoid.

## 2016-03-10 NOTE — ED Notes (Signed)
Pt still very aggressive and uncooperative at this time---- refused vital signs taken.

## 2016-03-10 NOTE — ED Provider Notes (Signed)
[facility]-EMERGENCY DEPT Provider Note   CSN: [id number] Arrival date & time: 03/10/16  4098     History   Chief Complaint Chief Complaint  Patient presents with  . Fall  . Aggressive Behavior    HPI Jeff Foster is a 55 y.o. male.  55 year old Caucasian male past medical history significant for seizure disorder, mental retardation, psychosis, hypertension presents to the ED this morning by EMS due to fall. Patient was at group home when the social worker stated that patient fell. Social worker  states patient did not complain of any pain at the time. Patient was seen on August 31 due to fall and hit his head. He sustained bilateral nasal fractures. Patient was admitted to psych ED. This morning patient became upset and started tearing up furniture. Patient was very agitated in the ED. However, behavior is baseline for patient. Unable to get accurate medical history. Patient states that he needed to urinate. Denies any pain at this time.  Patient denies any fever, chills, ha, vision changes, nausea, emesis, cp, sob, abd pain, urinary symptom, or changes in bowel habits. Patient is very combative and verbally aggressive. Patient complain that he needs to urinate and have a bowel movement. Denies any pain at this time.       Past Medical History:  Diagnosis Date  . Seizure (HCC)   . Stroke (cerebrum) [facility])     Patient Active Problem List   Diagnosis Date Noted  . Aggressive behavior, adult 03/06/2016  . Essential hypertension   . Post-ictal confusion 10/27/2015  . Hypothyroidism 10/27/2015  . Thrombocytopenia (HCC) 10/27/2015  . Acute respiratory failure with hypoxia (HCC) 10/27/2015  . Seizure (HCC) 10/27/2015  . Psychosis 10/27/2015  . Normocytic anemia 10/27/2015  . DNR (do not resuscitate) 07/25/2015  . Aspiration pneumonia (HCC) 07/25/2015  . Palliative care encounter 07/25/2015  . Dysphagia   . Altered mental status   . UTI (lower urinary tract infection) 07/18/2015  .  Cognitive decline 07/18/2015  . Acute encephalopathy 07/18/2015  . Mental retardation 07/18/2015  . Hypertension 07/18/2015  . History of CVA (cerebrovascular accident) 07/18/2015  . Encephalopathy acute 07/18/2015  . Dehydration     History reviewed. No pertinent surgical history.     Home Medications    Prior to Admission medications   Medication Sig Start Date End Date Taking? Authorizing Provider  acetaminophen (TYLENOL) 500 MG tablet Take 500 mg by mouth every 6 (six) hours as needed for mild pain, moderate pain, fever or headache.   Yes Historical Provider, MD  albuterol (PROVENTIL HFA;VENTOLIN HFA) 108 (90 Base) MCG/ACT inhaler Inhale 2 puffs into the lungs every 6 (six) hours as needed for wheezing or shortness of breath.   Yes Historical Provider, MD  atorvastatin (LIPITOR) 40 MG tablet Take 40 mg by mouth at bedtime.   Yes Historical Provider, MD  clonazePAM (KLONOPIN) 0.5 MG tablet Take 1 tablet (0.5 mg total) by mouth 2 (two) times daily. Patient taking differently: Take 0.5 mg by mouth 3 (three) times daily.  08/04/15  Yes Meredeth Ide, MD  divalproex (DEPAKOTE ER) 500 MG 24 hr tablet Take 1 tablet (500 mg total) by mouth every morning. 10/28/15  Yes Drema Dallas, MD  hydrOXYzine (VISTARIL) 50 MG capsule Take 50 mg by mouth 2 (two) times daily as needed for anxiety.   Yes Historical Provider, MD  levothyroxine (SYNTHROID, LEVOTHROID) 25 MCG tablet Take 25 mcg by mouth daily before breakfast.   Yes Historical Provider, MD  mirtazapine (  REMERON) 15 MG tablet Take 15 mg by mouth at bedtime.   Yes Historical Provider, MD  omeprazole (PRILOSEC) 20 MG capsule Take 20 mg by mouth daily.   Yes Historical Provider, MD  OXcarbazepine (TRILEPTAL) 150 MG tablet Take 150 mg by mouth 2 (two) times daily.   Yes Historical Provider, MD  risperiDONE (RISPERDAL) 1 MG tablet Take 1 tablet (1 mg total) by mouth 2 (two) times daily. 08/04/15  Yes Meredeth IdeGagan S Lama, MD  sertraline (ZOLOFT) 50 MG  tablet Take 100 mg by mouth daily.   Yes Historical Provider, MD  tamsulosin (FLOMAX) 0.4 MG CAPS capsule Take 0.4 mg by mouth at bedtime.   Yes Historical Provider, MD  traZODone (DESYREL) 50 MG tablet Take 100 mg by mouth at bedtime.   Yes Historical Provider, MD  divalproex (DEPAKOTE ER) 500 MG 24 hr tablet Take 3 tablets (1,500 mg total) by mouth at bedtime. Patient not taking: Reported on 03/04/2016 10/28/15   Drema Dallasurtis J Woods, MD  divalproex (DEPAKOTE ER) 500 MG 24 hr tablet Take 2 tablets (1,000 mg total) by mouth every morning. Patient not taking: Reported on 03/10/2016 03/06/16   Rada HayShuvon B Rankin, NP  food thickener (THICK IT) POWD Use as needed Patient not taking: Reported on 03/04/2016 08/04/15   Meredeth IdeGagan S Lama, MD  Oxcarbazepine (TRILEPTAL) 300 MG tablet Take 1 tablet (300 mg total) by mouth 2 (two) times daily. Patient not taking: Reported on 03/04/2016 08/04/15   Meredeth IdeGagan S Lama, MD  sertraline (ZOLOFT) 100 MG tablet Take 1 tablet (100 mg total) by mouth daily. Patient not taking: Reported on 03/10/2016 03/06/16   Shuvon B Rankin, NP  ziprasidone (GEODON) injection Inject 10 mg into the muscle every 2 (two) hours as needed for agitation (maximum two doses or 20 mg within 24 hours.). Patient not taking: Reported on 12/03/2015 08/04/15   Meredeth IdeGagan S Lama, MD    Family History History reviewed. No pertinent family history.  Social History Social History  Substance Use Topics  . Smoking status: Former Games developermoker  . Smokeless tobacco: Never Used  . Alcohol use Not on file     Allergies   Other and Peanut-containing drug products   Review of Systems Review of Systems  Constitutional: Negative for chills and fever.  HENT: Negative for congestion, ear pain, rhinorrhea and sore throat.   Eyes: Negative for pain and discharge.  Respiratory: Negative for cough and shortness of breath.   Cardiovascular: Negative for chest pain and palpitations.  Gastrointestinal: Negative for abdominal pain, diarrhea,  nausea and vomiting.  Genitourinary: Negative for flank pain, frequency, hematuria and urgency.  Musculoskeletal: Negative for myalgias and neck pain.  Neurological: Negative for dizziness, syncope, weakness, light-headedness, numbness and headaches.  All other systems reviewed and are negative.    Physical Exam Updated Vital Signs BP 135/77   Pulse 68   Temp 97.4 F (36.3 C) (Oral)   Resp 17   SpO2 96%   Physical Exam  Constitutional: He is oriented to person, place, and time. He appears well-developed and well-nourished. No distress.  Patient in restraints and agitated. Kicking and yelling at staff.   HENT:  Head: Normocephalic and atraumatic.  Right Ear: External ear normal.  Left Ear: External ear normal.  Nose: Nose normal.  Mouth/Throat: Oropharynx is clear and moist.  Patient with previous bruising under eyes bilaterally with several abrasion that are not actively bleeding. No battle sign, no racoon eyes. No hematoma noted. Patient denies any head, neck or face  pain. Difficult historian.  Eyes: Conjunctivae and EOM are normal. Pupils are equal, round, and reactive to light. Right eye exhibits no discharge. Left eye exhibits no discharge. No scleral icterus.  Neck: Normal range of motion. Neck supple. No thyromegaly present.  Cardiovascular: Normal rate, regular rhythm, normal heart sounds and intact distal pulses.   Pulmonary/Chest: Effort normal and breath sounds normal.  Abdominal: Soft. Bowel sounds are normal. He exhibits no distension. There is tenderness in the suprapubic area. There is no rigidity, no rebound and no guarding.  Musculoskeletal: Normal range of motion.  Patient with full ROM. Denies any pain. Small abrasions to right lower leg with bleeding controlled. No deformity noted. Moving all extremities. Pelvis stable.  Lymphadenopathy:    He has no cervical adenopathy.  Neurological: He is alert and oriented to person, place, and time. He has normal strength.  No cranial nerve deficit or sensory deficit.  Skin: Skin is warm and dry. Capillary refill takes less than 2 seconds.  Vitals reviewed.    ED Treatments / Results  Labs (all labs ordered are listed, but only abnormal results are displayed) Labs Reviewed  CBC WITH DIFFERENTIAL/PLATELET - Abnormal; Notable for the following:       Result Value   RBC 3.78 (*)    Hemoglobin 10.8 (*)    HCT 31.3 (*)    Platelets 109 (*)    All other components within normal limits  COMPREHENSIVE METABOLIC PANEL - Abnormal; Notable for the following:    Glucose, Bld 110 (*)    All other components within normal limits  ETHANOL  URINE RAPID DRUG SCREEN, HOSP PERFORMED  VALPROIC ACID LEVEL  URINALYSIS, ROUTINE W REFLEX MICROSCOPIC (NOT AT [facility])    EKG  EKG Interpretation  Date/Time:  Wednesday March 10 2016 09:43:29 EDT Ventricular Rate:  98 PR Interval:    QRS Duration: 151 QT Interval:  425 QTC Calculation: 543 R Axis:   -66 Text Interpretation:  Sinus rhythm RBBB and LAFB No significant change since last tracing Confirmed by KNAPP  MD-J, JON ([id number]) on 03/10/2016 9:50:49 AM       Radiology Ct Head Wo Contrast  Result Date: 03/10/2016 CLINICAL DATA:  Fall, facial bruising EXAM: CT HEAD WITHOUT CONTRAST CT MAXILLOFACIAL WITHOUT CONTRAST CT CERVICAL SPINE WITHOUT CONTRAST TECHNIQUE: Multidetector CT imaging of the head, cervical spine, and maxillofacial structures were performed using the standard protocol without intravenous contrast. Multiplanar CT image reconstructions of the cervical spine and maxillofacial structures were also generated. COMPARISON:  03/04/2016 FINDINGS: CT HEAD FINDINGS Brain: No intracranial hemorrhage, mass effect or midline shift. Mild periventricular white matter decreased attenuation probable due to chronic small vessel ischemic changes. No definite acute cortical infarction. No mass lesion is noted on this unenhanced scan. Vascular: No hyperdense vessel or unexpected  calcification. Skull: No skull fracture is noted. Again noted comminuted fracture bilateral nasal bone. Sinuses/Orbits: No paranasal sinuses air-fluid levels. The mastoid air cells are unremarkable. Other: None CT MAXILLOFACIAL FINDINGS Osseous: No mandibular fracture is noted. Again noted comminuted mild displaced fractures bilateral nasal bone. Mild mucosal thickening with partial opacification bilateral ethmoid air cells. Mild mucosal thickening bilateral frontal sinus. No intraorbital hematoma. Bilateral eye globe is symmetrical in appearance. There is subtle mild soft tissue swelling and subcutaneous stranding left face. No facial hematoma. Persistent mild soft tissue swelling perinasal region. There is no zygomatic fracture. Stable mild deformity of right zygoma. Coronal images shows no orbital rim or orbital floor fracture. There is mucosal thickening inferior aspect  of the left maxillary sinus. No TMJ dislocation. There is mild left deviation of inferior aspect of nasal bony septum. Minimal right deviation superior aspect of bony nasal septum. Bilateral semilunar canal is patent. Mild mucosal thickening inferior aspect of the right maxillary sinus. The nasal turbinates are unremarkable. Sagittal images shows no maxillary spine fracture. The nasopharyngeal and oropharyngeal airway is patent. The patient is edentulous. CT CERVICAL SPINE FINDINGS Alignment: Sagittal images of the spine shows normal alignment. Axial images shows no acute fracture or subluxation. Skull base and vertebrae: Vertebral body heights preserved. Soft tissues and spinal canal: Spinal canal is patent. No prevertebral soft tissue swelling. Disc levels: There are degenerative changes C1-C2 articulation. Disc space flattening with minimal anterior spurring at C3-C4 level. Moderate disc space flattening with anterior spurring mild posterior spurring and endplate sclerotic changes at C4-C5 and C5-C6 level. There is mild disc space flattening  with mild anterior and mild posterior spurring at C6-C7 level. Upper chest: There is no pneumothorax in visualized lung apices. Mild emphysematous changes bilateral lung apices. Mild thickening of upper esophageal wall. Gastroesophageal reflux disease cannot be excluded. Other: None IMPRESSION: 1. There is no acute intracranial abnormality. Mild periventricular white matter decreased attenuation probable due to chronic small vessel ischemic changes. 2. Again noted mild displaced comminuted fractures bilateral nasal bone. Mild perinasal soft tissue swelling with improvement from prior exam. 3. Subtle mild soft tissue swelling and subcutaneous stranding left face. No facial hematoma. 4. No mandibular fracture. No zygomatic fracture. No TMJ dislocation. No maxillary spine fracture. 5. No intraorbital hematoma. No orbital rim or orbital floor fracture. 6. No cervical spine acute fracture or subluxation. Multilevel degenerative changes as described above. 7. Mild emphysematous changes bilateral lung apices. 8. Paranasal sinuses disease as described above. 9. Mild thickening of upper esophageal wall. Gastroesophageal reflux cannot be excluded. Electronically Signed   By: Natasha Mead M.D.   On: 03/10/2016 11:49   Ct Cervical Spine Wo Contrast  Result Date: 03/10/2016 CLINICAL DATA:  Fall, facial bruising EXAM: CT HEAD WITHOUT CONTRAST CT MAXILLOFACIAL WITHOUT CONTRAST CT CERVICAL SPINE WITHOUT CONTRAST TECHNIQUE: Multidetector CT imaging of the head, cervical spine, and maxillofacial structures were performed using the standard protocol without intravenous contrast. Multiplanar CT image reconstructions of the cervical spine and maxillofacial structures were also generated. COMPARISON:  03/04/2016 FINDINGS: CT HEAD FINDINGS Brain: No intracranial hemorrhage, mass effect or midline shift. Mild periventricular white matter decreased attenuation probable due to chronic small vessel ischemic changes. No definite acute  cortical infarction. No mass lesion is noted on this unenhanced scan. Vascular: No hyperdense vessel or unexpected calcification. Skull: No skull fracture is noted. Again noted comminuted fracture bilateral nasal bone. Sinuses/Orbits: No paranasal sinuses air-fluid levels. The mastoid air cells are unremarkable. Other: None CT MAXILLOFACIAL FINDINGS Osseous: No mandibular fracture is noted. Again noted comminuted mild displaced fractures bilateral nasal bone. Mild mucosal thickening with partial opacification bilateral ethmoid air cells. Mild mucosal thickening bilateral frontal sinus. No intraorbital hematoma. Bilateral eye globe is symmetrical in appearance. There is subtle mild soft tissue swelling and subcutaneous stranding left face. No facial hematoma. Persistent mild soft tissue swelling perinasal region. There is no zygomatic fracture. Stable mild deformity of right zygoma. Coronal images shows no orbital rim or orbital floor fracture. There is mucosal thickening inferior aspect of the left maxillary sinus. No TMJ dislocation. There is mild left deviation of inferior aspect of nasal bony septum. Minimal right deviation superior aspect of bony nasal septum. Bilateral semilunar canal is  patent. Mild mucosal thickening inferior aspect of the right maxillary sinus. The nasal turbinates are unremarkable. Sagittal images shows no maxillary spine fracture. The nasopharyngeal and oropharyngeal airway is patent. The patient is edentulous. CT CERVICAL SPINE FINDINGS Alignment: Sagittal images of the spine shows normal alignment. Axial images shows no acute fracture or subluxation. Skull base and vertebrae: Vertebral body heights preserved. Soft tissues and spinal canal: Spinal canal is patent. No prevertebral soft tissue swelling. Disc levels: There are degenerative changes C1-C2 articulation. Disc space flattening with minimal anterior spurring at C3-C4 level. Moderate disc space flattening with anterior spurring  mild posterior spurring and endplate sclerotic changes at C4-C5 and C5-C6 level. There is mild disc space flattening with mild anterior and mild posterior spurring at C6-C7 level. Upper chest: There is no pneumothorax in visualized lung apices. Mild emphysematous changes bilateral lung apices. Mild thickening of upper esophageal wall. Gastroesophageal reflux disease cannot be excluded. Other: None IMPRESSION: 1. There is no acute intracranial abnormality. Mild periventricular white matter decreased attenuation probable due to chronic small vessel ischemic changes. 2. Again noted mild displaced comminuted fractures bilateral nasal bone. Mild perinasal soft tissue swelling with improvement from prior exam. 3. Subtle mild soft tissue swelling and subcutaneous stranding left face. No facial hematoma. 4. No mandibular fracture. No zygomatic fracture. No TMJ dislocation. No maxillary spine fracture. 5. No intraorbital hematoma. No orbital rim or orbital floor fracture. 6. No cervical spine acute fracture or subluxation. Multilevel degenerative changes as described above. 7. Mild emphysematous changes bilateral lung apices. 8. Paranasal sinuses disease as described above. 9. Mild thickening of upper esophageal wall. Gastroesophageal reflux cannot be excluded. Electronically Signed   By: Natasha Mead M.D.   On: 03/10/2016 11:49   Ct Abdomen Pelvis W Contrast  Result Date: 03/10/2016 CLINICAL DATA:  Generalized abdominal pain EXAM: CT ABDOMEN AND PELVIS WITH CONTRAST TECHNIQUE: Multidetector CT imaging of the abdomen and pelvis was performed using the standard protocol following bolus administration of intravenous contrast. CONTRAST:  ISOVUE-300 IOPAMIDOL (ISOVUE-300) INJECTION 61% COMPARISON:  None. FINDINGS: Lower chest: There is no lung base edema or consolidation. On axial slice 1 series 6, there is a 4 mm nodular opacity in the superior segment of the right lower lobe. Hepatobiliary: There is fatty infiltration  near the fissure for the ligamentum teres. No focal liver lesions are evident. The gallbladder is borderline distended without wall thickening. There is no biliary duct dilatation. Pancreas: No pancreatic mass or inflammatory focus. Spleen: No splenic lesions are evident. Adrenals/Urinary Tract: Adrenals appear normal bilaterally. Kidneys bilaterally show no appreciable mass or hydronephrosis on either side. There is no renal or ureteral calculus on either side. Urinary bladder is somewhat distended. There is no urinary bladder wall thickening. Stomach/Bowel: There is no appreciable bowel wall or mesenteric thickening. There is no appreciable bowel obstruction. No free air or portal venous air is evident. Vascular/Lymphatic: There is atherosclerotic calcification in the aorta and common iliac arteries. Calcification also extends into both hypogastric arteries. There is no abdominal aortic aneurysm. The major mesenteric vessels appear patent. There is no demonstrable adenopathy in the abdomen or pelvis. Reproductive: Prostate and seminal vesicles appear normal in size and contour. There are a few tiny prostatic calculi. Other: Appendix appears unremarkable. There is no abscess or ascites in the abdomen or pelvis. Musculoskeletal: There is a mildly displaced fracture of the left lateral ninth rib. There are displaced fractures of the right L3 and L4 there is no surrounding hematoma. Transverse processes. There  is degenerative change in the lumbar spine. There is broad-based disc bulging at L4-5 and L5-S1. There are no blastic or lytic bone lesions. There is no intramuscular or abdominal wall lesion. IMPRESSION: Slightly displaced fracture of the lateral left ninth rib which appears recent. Fractures of the right L3 and L4 transverse processes without surrounding hematoma. Urinary bladder is distended without wall thickening. No renal or ureteral calculi. No hydronephrosis. Prostate does not appear enlarged.  Gallbladder borderline distended without wall thickening. No bowel obstruction.  No abscess.  Appendix region appears normal. There is aortic and iliac artery atherosclerosis. Electronically Signed   By: Bretta Bang III M.D.   On: 03/10/2016 14:07   Ct Maxillofacial Wo Cm  Result Date: 03/10/2016 CLINICAL DATA:  Fall, facial bruising EXAM: CT HEAD WITHOUT CONTRAST CT MAXILLOFACIAL WITHOUT CONTRAST CT CERVICAL SPINE WITHOUT CONTRAST TECHNIQUE: Multidetector CT imaging of the head, cervical spine, and maxillofacial structures were performed using the standard protocol without intravenous contrast. Multiplanar CT image reconstructions of the cervical spine and maxillofacial structures were also generated. COMPARISON:  03/04/2016 FINDINGS: CT HEAD FINDINGS Brain: No intracranial hemorrhage, mass effect or midline shift. Mild periventricular white matter decreased attenuation probable due to chronic small vessel ischemic changes. No definite acute cortical infarction. No mass lesion is noted on this unenhanced scan. Vascular: No hyperdense vessel or unexpected calcification. Skull: No skull fracture is noted. Again noted comminuted fracture bilateral nasal bone. Sinuses/Orbits: No paranasal sinuses air-fluid levels. The mastoid air cells are unremarkable. Other: None CT MAXILLOFACIAL FINDINGS Osseous: No mandibular fracture is noted. Again noted comminuted mild displaced fractures bilateral nasal bone. Mild mucosal thickening with partial opacification bilateral ethmoid air cells. Mild mucosal thickening bilateral frontal sinus. No intraorbital hematoma. Bilateral eye globe is symmetrical in appearance. There is subtle mild soft tissue swelling and subcutaneous stranding left face. No facial hematoma. Persistent mild soft tissue swelling perinasal region. There is no zygomatic fracture. Stable mild deformity of right zygoma. Coronal images shows no orbital rim or orbital floor fracture. There is mucosal  thickening inferior aspect of the left maxillary sinus. No TMJ dislocation. There is mild left deviation of inferior aspect of nasal bony septum. Minimal right deviation superior aspect of bony nasal septum. Bilateral semilunar canal is patent. Mild mucosal thickening inferior aspect of the right maxillary sinus. The nasal turbinates are unremarkable. Sagittal images shows no maxillary spine fracture. The nasopharyngeal and oropharyngeal airway is patent. The patient is edentulous. CT CERVICAL SPINE FINDINGS Alignment: Sagittal images of the spine shows normal alignment. Axial images shows no acute fracture or subluxation. Skull base and vertebrae: Vertebral body heights preserved. Soft tissues and spinal canal: Spinal canal is patent. No prevertebral soft tissue swelling. Disc levels: There are degenerative changes C1-C2 articulation. Disc space flattening with minimal anterior spurring at C3-C4 level. Moderate disc space flattening with anterior spurring mild posterior spurring and endplate sclerotic changes at C4-C5 and C5-C6 level. There is mild disc space flattening with mild anterior and mild posterior spurring at C6-C7 level. Upper chest: There is no pneumothorax in visualized lung apices. Mild emphysematous changes bilateral lung apices. Mild thickening of upper esophageal wall. Gastroesophageal reflux disease cannot be excluded. Other: None IMPRESSION: 1. There is no acute intracranial abnormality. Mild periventricular white matter decreased attenuation probable due to chronic small vessel ischemic changes. 2. Again noted mild displaced comminuted fractures bilateral nasal bone. Mild perinasal soft tissue swelling with improvement from prior exam. 3. Subtle mild soft tissue swelling and subcutaneous stranding left face. No  facial hematoma. 4. No mandibular fracture. No zygomatic fracture. No TMJ dislocation. No maxillary spine fracture. 5. No intraorbital hematoma. No orbital rim or orbital floor fracture.  6. No cervical spine acute fracture or subluxation. Multilevel degenerative changes as described above. 7. Mild emphysematous changes bilateral lung apices. 8. Paranasal sinuses disease as described above. 9. Mild thickening of upper esophageal wall. Gastroesophageal reflux cannot be excluded. Electronically Signed   By: Natasha Mead M.D.   On: 03/10/2016 11:49    Procedures Procedures (including critical care time)  Medications Ordered in ED Medications  haloperidol lactate (HALDOL) injection 5 mg (5 mg Intramuscular Given 03/10/16 0615)  haloperidol lactate (HALDOL) injection 5 mg (5 mg Intramuscular Given 03/10/16 0639)  LORazepam (ATIVAN) injection 1 mg (1 mg Intramuscular Given 03/10/16 0945)  ziprasidone (GEODON) injection 10 mg (10 mg Intramuscular Given 03/10/16 1002)  sterile water (preservative free) injection (  Given 03/10/16 1003)  iopamidol (ISOVUE-300) 61 % injection 100 mL (100 mLs Intravenous Contrast Given 03/10/16 1346)     Initial Impression / Assessment and Plan / ED Course  I have reviewed the triage vital signs and the nursing notes.  Pertinent labs & imaging results that were available during my care of the patient were reviewed by me and considered in my medical decision making (see chart for details).  Clinical Course  Comment By Time  Pt stopped screaming when I spoke to him. He gets agitates and yells but he is responding appropriately at times.  Will see if he can be redirected to get his scan.  We have not been able to sedate him with meds. Linwood Dibbles, MD 09/06 1116   Patient presented to the ED this morning after fall at home. Patient denies any pain at this time. No neuro deficit. Abdomen unremarkable. Pelvis stable. Patient is verbally aggressive and swinging at staff. 4-point restraints. Patient states that he needs to urinate. Labs are unremarkable. Hemoglobin 10.8 which is baseline for patient. Depakote level normal. EKG without QT prolongation. 10 mg Haldol and 1 mg  Ativan given.  as the patient continues to be combative. 10 mg of Geodon given. However he responds properly at times when redirected. Delay in getting imaging. Patient to CAT scan to have head and neck CT. Without acute abnormalities. Patient stated that he was having some suprapubic abdominal pain. CT of abdomen showed right L3 and L4 transverse processes fractures along with rib fracture. Patient with acute urinary retention. Bladder scan showed over thousand cc of urine. Foley catheter inserted with immediate relief.  As discussed plan of care with Dr. Lynelle Doctor is who agrees that patient's aggressive behavior along with urinary retention and fractures not candidate for TCU. Discussed with caseworker does he meet observation criteria. Spoke with hospital medicine and they agreed to admit. Patient stable at this time.  Final Clinical Impressions(s) / ED Diagnoses   Final diagnoses:  Aggressive behavior  Fall, initial encounter  Rib fracture, left, closed, initial encounter  Closed fracture of spinous process of lumbar vertebra, initial encounter (HCC)  Urinary retention  Anemia, unspecified anemia type    New Prescriptions New Prescriptions   No medications on file     Rise Mu, Cordelia Poche 03/10/16 1643    April Palumbo, MD 03/10/16 2315

## 2016-03-10 NOTE — Progress Notes (Signed)
During shift change, patient became angry.  Tried to pull television off wall.  Security and NP called.  NP at bedside to assess patient.

## 2016-03-10 NOTE — Progress Notes (Signed)
Spoke with [facility] geologistmedical director and EDP, Knapp Determination made  [phone] ED CM spoke with [facility] hospitalist that EDP, Lynelle DoctorKnapp, spoke with, Wendy Poet. Gherghe about admission determination

## 2016-03-10 NOTE — ED Notes (Signed)
Patient ripped pulse ox cord from monitor, patient instructed to leave on finger, patient refusing to leave pulse ox on finger, in NAD. Will try again later.

## 2016-03-10 NOTE — H&P (Signed)
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   DVT prophylaxis:  Lovenox  Code Status: full Family Communication: no Disposition Plan: Back to group home once stable Admission status: MedSurg   Candelaria Stagers, MD Family Medicine Pager [phone]  If 7PM-7AM, please contact night-coverage [email or web address] Password [facility]  03/10/2016, 4:46 PM

## 2016-03-10 NOTE — Progress Notes (Signed)
Patient getting out of bed at shift change rounding. Pt restless, agitated, got out of bed and tried to pull TV off the wall. MD paged.

## 2016-03-10 NOTE — Progress Notes (Signed)
LVM for medical director
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
## 2016-03-11 ENCOUNTER — Encounter (HOSPITAL_COMMUNITY): Payer: Self-pay

## 2016-03-11 DIAGNOSIS — R339 Retention of urine, unspecified: Secondary | ICD-10-CM | POA: Diagnosis present

## 2016-03-11 DIAGNOSIS — Z87891 Personal history of nicotine dependence: Secondary | ICD-10-CM | POA: Diagnosis not present

## 2016-03-11 DIAGNOSIS — S32049A Unspecified fracture of fourth lumbar vertebra, initial encounter for closed fracture: Secondary | ICD-10-CM | POA: Diagnosis present

## 2016-03-11 DIAGNOSIS — F918 Other conduct disorders: Secondary | ICD-10-CM | POA: Diagnosis present

## 2016-03-11 DIAGNOSIS — F6381 Intermittent explosive disorder: Secondary | ICD-10-CM | POA: Diagnosis not present

## 2016-03-11 DIAGNOSIS — I4581 Long QT syndrome: Secondary | ICD-10-CM | POA: Diagnosis present

## 2016-03-11 DIAGNOSIS — Z91010 Allergy to peanuts: Secondary | ICD-10-CM | POA: Diagnosis not present

## 2016-03-11 DIAGNOSIS — F29 Unspecified psychosis not due to a substance or known physiological condition: Secondary | ICD-10-CM | POA: Diagnosis not present

## 2016-03-11 DIAGNOSIS — F419 Anxiety disorder, unspecified: Secondary | ICD-10-CM | POA: Diagnosis present

## 2016-03-11 DIAGNOSIS — Z7951 Long term (current) use of inhaled steroids: Secondary | ICD-10-CM | POA: Diagnosis not present

## 2016-03-11 DIAGNOSIS — T43595A Adverse effect of other antipsychotics and neuroleptics, initial encounter: Secondary | ICD-10-CM | POA: Diagnosis present

## 2016-03-11 DIAGNOSIS — W19XXXA Unspecified fall, initial encounter: Secondary | ICD-10-CM | POA: Diagnosis present

## 2016-03-11 DIAGNOSIS — D649 Anemia, unspecified: Secondary | ICD-10-CM | POA: Diagnosis present

## 2016-03-11 DIAGNOSIS — I10 Essential (primary) hypertension: Secondary | ICD-10-CM | POA: Diagnosis present

## 2016-03-11 DIAGNOSIS — R451 Restlessness and agitation: Secondary | ICD-10-CM | POA: Diagnosis present

## 2016-03-11 DIAGNOSIS — Z781 Physical restraint status: Secondary | ICD-10-CM | POA: Diagnosis not present

## 2016-03-11 DIAGNOSIS — Z79899 Other long term (current) drug therapy: Secondary | ICD-10-CM | POA: Diagnosis not present

## 2016-03-11 DIAGNOSIS — D696 Thrombocytopenia, unspecified: Secondary | ICD-10-CM

## 2016-03-11 DIAGNOSIS — S2232XA Fracture of one rib, left side, initial encounter for closed fracture: Secondary | ICD-10-CM | POA: Diagnosis present

## 2016-03-11 DIAGNOSIS — Z79891 Long term (current) use of opiate analgesic: Secondary | ICD-10-CM | POA: Diagnosis not present

## 2016-03-11 DIAGNOSIS — Z8673 Personal history of transient ischemic attack (TIA), and cerebral infarction without residual deficits: Secondary | ICD-10-CM | POA: Diagnosis not present

## 2016-03-11 DIAGNOSIS — F6089 Other specific personality disorders: Secondary | ICD-10-CM | POA: Diagnosis not present

## 2016-03-11 DIAGNOSIS — F79 Unspecified intellectual disabilities: Secondary | ICD-10-CM | POA: Diagnosis present

## 2016-03-11 DIAGNOSIS — G40909 Epilepsy, unspecified, not intractable, without status epilepticus: Secondary | ICD-10-CM | POA: Diagnosis present

## 2016-03-11 DIAGNOSIS — E039 Hypothyroidism, unspecified: Secondary | ICD-10-CM | POA: Diagnosis present

## 2016-03-11 DIAGNOSIS — Z87440 Personal history of urinary (tract) infections: Secondary | ICD-10-CM | POA: Diagnosis not present

## 2016-03-11 DIAGNOSIS — F23 Brief psychotic disorder: Secondary | ICD-10-CM | POA: Diagnosis present

## 2016-03-11 LAB — BASIC METABOLIC PANEL
ANION GAP: 7 (ref 5–15)
BUN: 7 mg/dL (ref 6–20)
CHLORIDE: 104 mmol/L (ref 101–111)
CO2: 29 mmol/L (ref 22–32)
Calcium: 8.9 mg/dL (ref 8.9–10.3)
Creatinine, Ser: 0.6 mg/dL — ABNORMAL LOW (ref 0.61–1.24)
GFR calc Af Amer: >60 mL/min (ref >60)
GFR calc non Af Amer: >60 mL/min (ref >60)
Glucose, Bld: 90 mg/dL (ref 65–99)
POTASSIUM: 3.4 mmol/L — AB (ref 3.5–5.1)
SODIUM: 140 mmol/L (ref 135–145)

## 2016-03-11 LAB — CBC
HCT: 31.6 % — ABNORMAL LOW (ref 39.0–52.0)
HEMOGLOBIN: 10.6 g/dL — AB (ref 13.0–17.0)
MCH: 28.4 pg (ref 26.0–34.0)
MCHC: 33.5 g/dL (ref 30.0–36.0)
MCV: 84.7 fL (ref 78.0–100.0)
Platelets: 122 10*3/uL — ABNORMAL LOW (ref 150–400)
RBC: 3.73 MIL/uL — AB (ref 4.22–5.81)
RDW: 13.9 % (ref 11.5–15.5)
WBC: 7 10*3/uL (ref 4.0–10.5)

## 2016-03-11 LAB — TSH: TSH: 0.522 u[IU]/mL (ref 0.350–4.500)

## 2016-03-11 LAB — MAGNESIUM: Magnesium: 1.9 mg/dL (ref 1.7–2.4)

## 2016-03-11 MED ORDER — ENOXAPARIN SODIUM 40 MG/0.4ML ~~LOC~~ SOLN
40.0000 mg | Freq: Every day | SUBCUTANEOUS | Status: DC
  Administered 2016-03-12: 40 mg via SUBCUTANEOUS
  Filled 2016-03-11: qty 0.4

## 2016-03-11 MED ORDER — ZIPRASIDONE MESYLATE 20 MG IM SOLR
10.0000 mg | INTRAMUSCULAR | Status: DC | PRN
  Filled 2016-03-11: qty 20

## 2016-03-11 MED ORDER — POTASSIUM CHLORIDE CRYS ER 20 MEQ PO TBCR
40.0000 meq | EXTENDED_RELEASE_TABLET | Freq: Once | ORAL | Status: AC
  Administered 2016-03-11: 40 meq via ORAL
  Filled 2016-03-11: qty 2

## 2016-03-11 MED ORDER — LORAZEPAM 2 MG/ML IJ SOLN
2.0000 mg | Freq: Once | INTRAMUSCULAR | Status: AC
  Administered 2016-03-10: 2 mg via INTRAMUSCULAR
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
## 2016-03-11 NOTE — Progress Notes (Signed)
Called Patient Legal Guardian. Left Voicemail

## 2016-03-11 NOTE — Care Management Obs Status (Signed)
MEDICARE OBSERVATION STATUS NOTIFICATION   Patient Details  Name: Genene ChurnJohn Stalvey MRN: [id number] Date of Birth: [birth year]   Medicare Observation Status Notification Given:  No Patient in 4 point restraints   Golda AcreDavis, Rhonda Lynn, RN 03/11/2016, 3:17 PM

## 2016-03-11 NOTE — Progress Notes (Signed)
PROGRESS NOTE                                                                                                                                                                                                             Patient Demographics:    Jeff Foster, is a 55 y.o. male, DOB - 03/05/1961, ZOX:[id number]  Admit date - 03/10/2016   Admitting Physician Costin Otelia Sergeant, MD  Outpatient Primary MD for the patient is Diamantina Providence, FNP  LOS - 0  Outpatient Specialists: psych  Chief Complaint  Patient presents with  . Fall  . Aggressive Behavior       Brief Narrative   55 year old male with seizure disorder, psychosis, mental retardation brought to the ED by EMS after he fell at the group home. He was on 2. distended to aggressive behavior and restlessness. In the ED her vitals were stable, labs unremarkable. His induction was negative, head CT and neck without acute abnormality. CT abdomen showed a slightly discrete fracture of the left lateral ninth rib which seemed recent. He was given 2 doses of IV Haldol, lorazepam and Geodon. He was found to have acute urinary retention and recommended observation on the hospitalist service. Following admission patient was extremely agitated requiring point restrains and bedside sitter. He received a total of 30 mg of Geodon on the day of admission.   Subjective:   Patient became extremely agitated and violent this morning, cursing, spitting and throwing pillows at me. He started cursing the nurse and Arts administrator. Restrained secured and added vest restrains for safety. He slept shortly after his morning medications became effective.   Assessment  & Plan :    Principal problem Acute urinary retention Suspect his psychiatric medications including a role. CT negative for urinary retention, hydronephrosis or enlarged prostate. I will add Flomax. Psych consulted to adjust his  medications.  attempt voiding trial in the morning once he is more corporative.  Acute psychosis Continue when necessary Ativan and risperidone. We'll add Geodon it becomes aggressive again. Psych consult pending.    prolonged QTC   need to use Qt prolonging agents with caution. Replace low potassium and magnesium. Continue telemetry monitoring.  Seizure disorder Continue home dose Depakote and Trileptal.  Hypothyroidism Continue Synthroid  Anxiety Continue hydroxyzine.  Hypertension Not on any blood pressure medications at home.  Patricia stable. Continue to monitor  Thrombocytopenia Chronic, platelets of 122. Stable.  Fall at assist living Psychiatric medication may be playing a role. Imaging shows lateral left ninth rib and transverse fracture of right L3 and L4 without surrounding hematoma. Conservative management.      Code Status : Full code  Family Communication  : None at bedside  Disposition Plan  : We need inpatient psych admission once able to void without difficulty. Should be ready tomorrow.  Barriers For Discharge : Urinary retention, active psychosis  Consults  :  Psychiatry  Procedures  : CT head  DVT Prophylaxis  : SCDs   Lab Results  Component Value Date   PLT 122 (L) 03/11/2016    Antibiotics  :   Anti-infectives    None        Objective:   Vitals:   03/10/16 1700 03/10/16 1731 03/11/16 0046 03/11/16 0355  BP: 151/74 138/81 (!) 159/96 (!) 158/83  Pulse: 70 63 70 68  Resp: 17 18 18 18   Temp:  98.7 F (37.1 C) 97.7 F (36.5 C) 98.2 F (36.8 C)  TempSrc:  Oral Oral Oral  SpO2: 94% 100% 99% 99%    Wt Readings from Last 3 Encounters:  03/04/16 99.3 kg (219 lb)  10/28/15 90.7 kg (199 lb 15.3 oz)  08/07/15 85.4 kg (188 lb 3.3 oz)     Intake/Output Summary (Last 24 hours) at 03/11/16 1132 Last data filed at 03/11/16 0530  Gross per 24 hour  Intake                0 ml  Output             1100 ml  Net            -1100 ml      Physical Exam  Gen: Stream the agitated and restless HEENT: moist mucosa, supple neck Chest: clear b/l, no added sounds CVS: N S1&S2, no murmurs,  GI: soft, NT, ND, BS+ Musculoskeletal: warm, no edema, on restrains CNS: Agitated, restless, poorly communicative    Data Review:    CBC  Recent Labs Lab 03/04/16 1457 03/10/16 0621 03/11/16 0520  WBC 7.9 6.9 7.0  HGB 10.3* 10.8* 10.6*  HCT 30.2* 31.3* 31.6*  PLT 103* 109* 122*  MCV 84.6 82.8 84.7  MCH 28.9 28.6 28.4  MCHC 34.1 34.5 33.5  RDW 13.7 13.5 13.9  LYMPHSABS 1.4 2.1  --   MONOABS 1.1* 0.8  --   EOSABS 0.0 0.2  --   BASOSABS 0.0 0.0  --     Chemistries   Recent Labs Lab 03/04/16 1457 03/10/16 0621 03/11/16 0520  NA 139 135 140  K 3.5 3.5 3.4*  CL 103 102 104  CO2 31 26 29   GLUCOSE 76 110* 90  BUN 6 7 7   CREATININE 0.65 0.66 0.60*  CALCIUM 9.1 8.9 8.9  MG  --   --  1.9  AST 46* 32  --   ALT 32 23  --   ALKPHOS 68 90  --   BILITOT 1.0 1.0  --    ------------------------------------------------------------------------------------------------------------------ No results for input(s): CHOL, HDL, LDLCALC, TRIG, CHOLHDL, LDLDIRECT in the last 72 hours.  No results found for: HGBA1C ------------------------------------------------------------------------------------------------------------------  Recent Labs  03/11/16 0520  TSH 0.522   ------------------------------------------------------------------------------------------------------------------ No results for input(s): VITAMINB12, FOLATE, FERRITIN, TIBC, IRON, RETICCTPCT in the last 72 hours.  Coagulation profile No results for input(s): INR, PROTIME in the last 168 hours.  No results for input(s): DDIMER in the last 72 hours.  Cardiac Enzymes No results for input(s): CKMB, TROPONINI, MYOGLOBIN in the last 168 hours.  Invalid input(s):  CK ------------------------------------------------------------------------------------------------------------------ No results found for: BNP  Inpatient Medications  Scheduled Meds: . atorvastatin  40 mg Oral QHS  . divalproex  500 mg Oral q morning - 10a  . enoxaparin (LOVENOX) injection  40 mg Subcutaneous Q24H  . levothyroxine  25 mcg Oral QAC breakfast  . mirtazapine  15 mg Oral QHS  . OXcarbazepine  150 mg Oral BID  . pantoprazole  40 mg Oral Daily  . risperiDONE  1 mg Oral BID  . tamsulosin  0.4 mg Oral QHS  . traZODone  100 mg Oral QHS   Continuous Infusions:  PRN Meds:.acetaminophen, albuterol, LORazepam  Micro Results No results found for this or any previous visit (from the past 240 hour(s)).  Radiology Reports Dg Chest 2 View  Result Date: 03/04/2016 CLINICAL DATA:  Pre psychiatric admission. EXAM: CHEST  2 VIEW COMPARISON:  12/03/2015 FINDINGS: The cardiomediastinal silhouette is unremarkable. Mild peribronchial thickening is unchanged. There is no evidence of focal airspace disease, pulmonary edema, suspicious pulmonary nodule/mass, pleural effusion, or pneumothorax. No acute bony abnormalities are identified. IMPRESSION: No evidence of acute cardiopulmonary disease. Mild chronic peribronchial thickening. Electronically Signed   By: Harmon Pier M.D.   On: 03/04/2016 18:27   Ct Head Wo Contrast  Result Date: 03/10/2016 CLINICAL DATA:  Fall, facial bruising EXAM: CT HEAD WITHOUT CONTRAST CT MAXILLOFACIAL WITHOUT CONTRAST CT CERVICAL SPINE WITHOUT CONTRAST TECHNIQUE: Multidetector CT imaging of the head, cervical spine, and maxillofacial structures were performed using the standard protocol without intravenous contrast. Multiplanar CT image reconstructions of the cervical spine and maxillofacial structures were also generated. COMPARISON:  03/04/2016 FINDINGS: CT HEAD FINDINGS Brain: No intracranial hemorrhage, mass effect or midline shift. Mild periventricular white matter  decreased attenuation probable due to chronic small vessel ischemic changes. No definite acute cortical infarction. No mass lesion is noted on this unenhanced scan. Vascular: No hyperdense vessel or unexpected calcification. Skull: No skull fracture is noted. Again noted comminuted fracture bilateral nasal bone. Sinuses/Orbits: No paranasal sinuses air-fluid levels. The mastoid air cells are unremarkable. Other: None CT MAXILLOFACIAL FINDINGS Osseous: No mandibular fracture is noted. Again noted comminuted mild displaced fractures bilateral nasal bone. Mild mucosal thickening with partial opacification bilateral ethmoid air cells. Mild mucosal thickening bilateral frontal sinus. No intraorbital hematoma. Bilateral eye globe is symmetrical in appearance. There is subtle mild soft tissue swelling and subcutaneous stranding left face. No facial hematoma. Persistent mild soft tissue swelling perinasal region. There is no zygomatic fracture. Stable mild deformity of right zygoma. Coronal images shows no orbital rim or orbital floor fracture. There is mucosal thickening inferior aspect of the left maxillary sinus. No TMJ dislocation. There is mild left deviation of inferior aspect of nasal bony septum. Minimal right deviation superior aspect of bony nasal septum. Bilateral semilunar canal is patent. Mild mucosal thickening inferior aspect of the right maxillary sinus. The nasal turbinates are unremarkable. Sagittal images shows no maxillary spine fracture. The nasopharyngeal and oropharyngeal airway is patent. The patient is edentulous. CT CERVICAL SPINE FINDINGS Alignment: Sagittal images of the spine shows normal alignment. Axial images shows no acute fracture or subluxation. Skull base and vertebrae: Vertebral body heights preserved. Soft tissues and spinal canal: Spinal canal is patent. No prevertebral soft tissue swelling. Disc levels: There are degenerative changes C1-C2 articulation. Disc space flattening with  minimal anterior spurring  at C3-C4 level. Moderate disc space flattening with anterior spurring mild posterior spurring and endplate sclerotic changes at C4-C5 and C5-C6 level. There is mild disc space flattening with mild anterior and mild posterior spurring at C6-C7 level. Upper chest: There is no pneumothorax in visualized lung apices. Mild emphysematous changes bilateral lung apices. Mild thickening of upper esophageal wall. Gastroesophageal reflux disease cannot be excluded. Other: None IMPRESSION: 1. There is no acute intracranial abnormality. Mild periventricular white matter decreased attenuation probable due to chronic small vessel ischemic changes. 2. Again noted mild displaced comminuted fractures bilateral nasal bone. Mild perinasal soft tissue swelling with improvement from prior exam. 3. Subtle mild soft tissue swelling and subcutaneous stranding left face. No facial hematoma. 4. No mandibular fracture. No zygomatic fracture. No TMJ dislocation. No maxillary spine fracture. 5. No intraorbital hematoma. No orbital rim or orbital floor fracture. 6. No cervical spine acute fracture or subluxation. Multilevel degenerative changes as described above. 7. Mild emphysematous changes bilateral lung apices. 8. Paranasal sinuses disease as described above. 9. Mild thickening of upper esophageal wall. Gastroesophageal reflux cannot be excluded. Electronically Signed   By: Natasha Mead M.D.   On: 03/10/2016 11:49   Ct Head Wo Contrast  Result Date: 03/04/2016 CLINICAL DATA:  55 year old male with altered mental status and facial pain and swelling following facial injury. EXAM: CT HEAD WITHOUT CONTRAST CT MAXILLOFACIAL WITHOUT CONTRAST TECHNIQUE: Multidetector CT imaging of the head and maxillofacial structures were performed using the standard protocol without intravenous contrast. Multiplanar CT image reconstructions of the maxillofacial structures were also generated. COMPARISON:  12/03/2015 and 07/18/2015 CTs  FINDINGS: CT HEAD FINDINGS Chronic small-vessel white matter ischemic changes are again identified. No acute intracranial abnormalities are identified, including mass lesion or mass effect, hydrocephalus, extra-axial fluid collection, midline shift, hemorrhage, or acute infarction. The skull is unremarkable. CT MAXILLOFACIAL FINDINGS Mildly comminuted bilateral nasal fractures are present with overlying soft tissue swelling. Anterior subluxation/dislocation of the mandibular condyle at the right TMJ noted. Remote fractures of the nasal septum and right zygoma again noted. No other fracture, subluxation or dislocation identified. Mucosal thickening in scattered anterior ethmoid air cells and left maxillary sinus noted. The mastoid air cells and middle/inner ears are clear. The orbits and globes are unremarkable. Moderate degenerative disc disease and spondylosis from C4-C7 noted. IMPRESSION: Mildly comminuted bilateral nasal fractures with overlying soft tissue swelling. Anterior subluxation/dislocation at the right TMJ. No evidence of acute intracranial abnormality. Chronic small-vessel white matter ischemic changes. Electronically Signed   By: Harmon Pier M.D.   On: 03/04/2016 14:43   Ct Cervical Spine Wo Contrast  Result Date: 03/10/2016 CLINICAL DATA:  Fall, facial bruising EXAM: CT HEAD WITHOUT CONTRAST CT MAXILLOFACIAL WITHOUT CONTRAST CT CERVICAL SPINE WITHOUT CONTRAST TECHNIQUE: Multidetector CT imaging of the head, cervical spine, and maxillofacial structures were performed using the standard protocol without intravenous contrast. Multiplanar CT image reconstructions of the cervical spine and maxillofacial structures were also generated. COMPARISON:  03/04/2016 FINDINGS: CT HEAD FINDINGS Brain: No intracranial hemorrhage, mass effect or midline shift. Mild periventricular white matter decreased attenuation probable due to chronic small vessel ischemic changes. No definite acute cortical infarction. No  mass lesion is noted on this unenhanced scan. Vascular: No hyperdense vessel or unexpected calcification. Skull: No skull fracture is noted. Again noted comminuted fracture bilateral nasal bone. Sinuses/Orbits: No paranasal sinuses air-fluid levels. The mastoid air cells are unremarkable. Other: None CT MAXILLOFACIAL FINDINGS Osseous: No mandibular fracture is noted. Again noted comminuted mild displaced fractures bilateral  nasal bone. Mild mucosal thickening with partial opacification bilateral ethmoid air cells. Mild mucosal thickening bilateral frontal sinus. No intraorbital hematoma. Bilateral eye globe is symmetrical in appearance. There is subtle mild soft tissue swelling and subcutaneous stranding left face. No facial hematoma. Persistent mild soft tissue swelling perinasal region. There is no zygomatic fracture. Stable mild deformity of right zygoma. Coronal images shows no orbital rim or orbital floor fracture. There is mucosal thickening inferior aspect of the left maxillary sinus. No TMJ dislocation. There is mild left deviation of inferior aspect of nasal bony septum. Minimal right deviation superior aspect of bony nasal septum. Bilateral semilunar canal is patent. Mild mucosal thickening inferior aspect of the right maxillary sinus. The nasal turbinates are unremarkable. Sagittal images shows no maxillary spine fracture. The nasopharyngeal and oropharyngeal airway is patent. The patient is edentulous. CT CERVICAL SPINE FINDINGS Alignment: Sagittal images of the spine shows normal alignment. Axial images shows no acute fracture or subluxation. Skull base and vertebrae: Vertebral body heights preserved. Soft tissues and spinal canal: Spinal canal is patent. No prevertebral soft tissue swelling. Disc levels: There are degenerative changes C1-C2 articulation. Disc space flattening with minimal anterior spurring at C3-C4 level. Moderate disc space flattening with anterior spurring mild posterior spurring and  endplate sclerotic changes at C4-C5 and C5-C6 level. There is mild disc space flattening with mild anterior and mild posterior spurring at C6-C7 level. Upper chest: There is no pneumothorax in visualized lung apices. Mild emphysematous changes bilateral lung apices. Mild thickening of upper esophageal wall. Gastroesophageal reflux disease cannot be excluded. Other: None IMPRESSION: 1. There is no acute intracranial abnormality. Mild periventricular white matter decreased attenuation probable due to chronic small vessel ischemic changes. 2. Again noted mild displaced comminuted fractures bilateral nasal bone. Mild perinasal soft tissue swelling with improvement from prior exam. 3. Subtle mild soft tissue swelling and subcutaneous stranding left face. No facial hematoma. 4. No mandibular fracture. No zygomatic fracture. No TMJ dislocation. No maxillary spine fracture. 5. No intraorbital hematoma. No orbital rim or orbital floor fracture. 6. No cervical spine acute fracture or subluxation. Multilevel degenerative changes as described above. 7. Mild emphysematous changes bilateral lung apices. 8. Paranasal sinuses disease as described above. 9. Mild thickening of upper esophageal wall. Gastroesophageal reflux cannot be excluded. Electronically Signed   By: Natasha Mead M.D.   On: 03/10/2016 11:49   Ct Abdomen Pelvis W Contrast  Result Date: 03/10/2016 CLINICAL DATA:  Generalized abdominal pain EXAM: CT ABDOMEN AND PELVIS WITH CONTRAST TECHNIQUE: Multidetector CT imaging of the abdomen and pelvis was performed using the standard protocol following bolus administration of intravenous contrast. CONTRAST:  ISOVUE-300 IOPAMIDOL (ISOVUE-300) INJECTION 61% COMPARISON:  None. FINDINGS: Lower chest: There is no lung base edema or consolidation. On axial slice 1 series 6, there is a 4 mm nodular opacity in the superior segment of the right lower lobe. Hepatobiliary: There is fatty infiltration near the fissure for the  ligamentum teres. No focal liver lesions are evident. The gallbladder is borderline distended without wall thickening. There is no biliary duct dilatation. Pancreas: No pancreatic mass or inflammatory focus. Spleen: No splenic lesions are evident. Adrenals/Urinary Tract: Adrenals appear normal bilaterally. Kidneys bilaterally show no appreciable mass or hydronephrosis on either side. There is no renal or ureteral calculus on either side. Urinary bladder is somewhat distended. There is no urinary bladder wall thickening. Stomach/Bowel: There is no appreciable bowel wall or mesenteric thickening. There is no appreciable bowel obstruction. No free air or  portal venous air is evident. Vascular/Lymphatic: There is atherosclerotic calcification in the aorta and common iliac arteries. Calcification also extends into both hypogastric arteries. There is no abdominal aortic aneurysm. The major mesenteric vessels appear patent. There is no demonstrable adenopathy in the abdomen or pelvis. Reproductive: Prostate and seminal vesicles appear normal in size and contour. There are a few tiny prostatic calculi. Other: Appendix appears unremarkable. There is no abscess or ascites in the abdomen or pelvis. Musculoskeletal: There is a mildly displaced fracture of the left lateral ninth rib. There are displaced fractures of the right L3 and L4 there is no surrounding hematoma. Transverse processes. There is degenerative change in the lumbar spine. There is broad-based disc bulging at L4-5 and L5-S1. There are no blastic or lytic bone lesions. There is no intramuscular or abdominal wall lesion. IMPRESSION: Slightly displaced fracture of the lateral left ninth rib which appears recent. Fractures of the right L3 and L4 transverse processes without surrounding hematoma. Urinary bladder is distended without wall thickening. No renal or ureteral calculi. No hydronephrosis. Prostate does not appear enlarged. Gallbladder borderline distended  without wall thickening. No bowel obstruction.  No abscess.  Appendix region appears normal. There is aortic and iliac artery atherosclerosis. Electronically Signed   By: Bretta Bang III M.D.   On: 03/10/2016 14:07   Ct Maxillofacial Wo Cm  Result Date: 03/10/2016 CLINICAL DATA:  Fall, facial bruising EXAM: CT HEAD WITHOUT CONTRAST CT MAXILLOFACIAL WITHOUT CONTRAST CT CERVICAL SPINE WITHOUT CONTRAST TECHNIQUE: Multidetector CT imaging of the head, cervical spine, and maxillofacial structures were performed using the standard protocol without intravenous contrast. Multiplanar CT image reconstructions of the cervical spine and maxillofacial structures were also generated. COMPARISON:  03/04/2016 FINDINGS: CT HEAD FINDINGS Brain: No intracranial hemorrhage, mass effect or midline shift. Mild periventricular white matter decreased attenuation probable due to chronic small vessel ischemic changes. No definite acute cortical infarction. No mass lesion is noted on this unenhanced scan. Vascular: No hyperdense vessel or unexpected calcification. Skull: No skull fracture is noted. Again noted comminuted fracture bilateral nasal bone. Sinuses/Orbits: No paranasal sinuses air-fluid levels. The mastoid air cells are unremarkable. Other: None CT MAXILLOFACIAL FINDINGS Osseous: No mandibular fracture is noted. Again noted comminuted mild displaced fractures bilateral nasal bone. Mild mucosal thickening with partial opacification bilateral ethmoid air cells. Mild mucosal thickening bilateral frontal sinus. No intraorbital hematoma. Bilateral eye globe is symmetrical in appearance. There is subtle mild soft tissue swelling and subcutaneous stranding left face. No facial hematoma. Persistent mild soft tissue swelling perinasal region. There is no zygomatic fracture. Stable mild deformity of right zygoma. Coronal images shows no orbital rim or orbital floor fracture. There is mucosal thickening inferior aspect of the left  maxillary sinus. No TMJ dislocation. There is mild left deviation of inferior aspect of nasal bony septum. Minimal right deviation superior aspect of bony nasal septum. Bilateral semilunar canal is patent. Mild mucosal thickening inferior aspect of the right maxillary sinus. The nasal turbinates are unremarkable. Sagittal images shows no maxillary spine fracture. The nasopharyngeal and oropharyngeal airway is patent. The patient is edentulous. CT CERVICAL SPINE FINDINGS Alignment: Sagittal images of the spine shows normal alignment. Axial images shows no acute fracture or subluxation. Skull base and vertebrae: Vertebral body heights preserved. Soft tissues and spinal canal: Spinal canal is patent. No prevertebral soft tissue swelling. Disc levels: There are degenerative changes C1-C2 articulation. Disc space flattening with minimal anterior spurring at C3-C4 level. Moderate disc space flattening with anterior spurring mild posterior  spurring and endplate sclerotic changes at C4-C5 and C5-C6 level. There is mild disc space flattening with mild anterior and mild posterior spurring at C6-C7 level. Upper chest: There is no pneumothorax in visualized lung apices. Mild emphysematous changes bilateral lung apices. Mild thickening of upper esophageal wall. Gastroesophageal reflux disease cannot be excluded. Other: None IMPRESSION: 1. There is no acute intracranial abnormality. Mild periventricular white matter decreased attenuation probable due to chronic small vessel ischemic changes. 2. Again noted mild displaced comminuted fractures bilateral nasal bone. Mild perinasal soft tissue swelling with improvement from prior exam. 3. Subtle mild soft tissue swelling and subcutaneous stranding left face. No facial hematoma. 4. No mandibular fracture. No zygomatic fracture. No TMJ dislocation. No maxillary spine fracture. 5. No intraorbital hematoma. No orbital rim or orbital floor fracture. 6. No cervical spine acute fracture or  subluxation. Multilevel degenerative changes as described above. 7. Mild emphysematous changes bilateral lung apices. 8. Paranasal sinuses disease as described above. 9. Mild thickening of upper esophageal wall. Gastroesophageal reflux cannot be excluded. Electronically Signed   By: Natasha Mead M.D.   On: 03/10/2016 11:49   Ct Maxillofacial Wo Contrast  Result Date: 03/04/2016 CLINICAL DATA:  55 year old male with altered mental status and facial pain and swelling following facial injury. EXAM: CT HEAD WITHOUT CONTRAST CT MAXILLOFACIAL WITHOUT CONTRAST TECHNIQUE: Multidetector CT imaging of the head and maxillofacial structures were performed using the standard protocol without intravenous contrast. Multiplanar CT image reconstructions of the maxillofacial structures were also generated. COMPARISON:  12/03/2015 and 07/18/2015 CTs FINDINGS: CT HEAD FINDINGS Chronic small-vessel white matter ischemic changes are again identified. No acute intracranial abnormalities are identified, including mass lesion or mass effect, hydrocephalus, extra-axial fluid collection, midline shift, hemorrhage, or acute infarction. The skull is unremarkable. CT MAXILLOFACIAL FINDINGS Mildly comminuted bilateral nasal fractures are present with overlying soft tissue swelling. Anterior subluxation/dislocation of the mandibular condyle at the right TMJ noted. Remote fractures of the nasal septum and right zygoma again noted. No other fracture, subluxation or dislocation identified. Mucosal thickening in scattered anterior ethmoid air cells and left maxillary sinus noted. The mastoid air cells and middle/inner ears are clear. The orbits and globes are unremarkable. Moderate degenerative disc disease and spondylosis from C4-C7 noted. IMPRESSION: Mildly comminuted bilateral nasal fractures with overlying soft tissue swelling. Anterior subluxation/dislocation at the right TMJ. No evidence of acute intracranial abnormality. Chronic small-vessel  white matter ischemic changes. Electronically Signed   By: Harmon Pier M.D.   On: 03/04/2016 14:43    Time Spent in minutes  35   Eddie North M.D on 03/11/2016 at 11:32 AM  Between 7am to 7pm - Pager - [phone]  After 7pm go to [email or web address] - password [email or web address]  [facility] -  Office  [phone]

## 2016-03-11 NOTE — Progress Notes (Signed)
Left voicemail for [facility] Owner : Georgina PeerDerrick McDowell [phone]

## 2016-03-12 ENCOUNTER — Emergency Department (HOSPITAL_COMMUNITY)
Admission: EM | Admit: 2016-03-12 | Discharge: 2016-03-17 | Disposition: A | Discharge status: Home / Self Care | Payer: Medicare Other | Attending: Emergency Medicine | Admitting: Emergency Medicine

## 2016-03-12 ENCOUNTER — Encounter (HOSPITAL_COMMUNITY): Payer: Self-pay | Admitting: Emergency Medicine

## 2016-03-12 DIAGNOSIS — F79 Unspecified intellectual disabilities: Secondary | ICD-10-CM

## 2016-03-12 DIAGNOSIS — F6381 Intermittent explosive disorder: Secondary | ICD-10-CM

## 2016-03-12 DIAGNOSIS — F6089 Other specific personality disorders: Secondary | ICD-10-CM | POA: Diagnosis not present

## 2016-03-12 DIAGNOSIS — R4689 Other symptoms and signs involving appearance and behavior: Secondary | ICD-10-CM

## 2016-03-12 DIAGNOSIS — Z87891 Personal history of nicotine dependence: Secondary | ICD-10-CM | POA: Insufficient documentation

## 2016-03-12 DIAGNOSIS — R45851 Suicidal ideations: Secondary | ICD-10-CM | POA: Diagnosis not present

## 2016-03-12 DIAGNOSIS — Z79891 Long term (current) use of opiate analgesic: Secondary | ICD-10-CM | POA: Insufficient documentation

## 2016-03-12 DIAGNOSIS — Z7951 Long term (current) use of inhaled steroids: Secondary | ICD-10-CM | POA: Insufficient documentation

## 2016-03-12 DIAGNOSIS — F411 Generalized anxiety disorder: Secondary | ICD-10-CM | POA: Diagnosis not present

## 2016-03-12 DIAGNOSIS — Z79899 Other long term (current) drug therapy: Secondary | ICD-10-CM | POA: Insufficient documentation

## 2016-03-12 DIAGNOSIS — Z8673 Personal history of transient ischemic attack (TIA), and cerebral infarction without residual deficits: Secondary | ICD-10-CM | POA: Insufficient documentation

## 2016-03-12 DIAGNOSIS — N400 Enlarged prostate without lower urinary tract symptoms: Secondary | ICD-10-CM

## 2016-03-12 DIAGNOSIS — I4581 Long QT syndrome: Secondary | ICD-10-CM

## 2016-03-12 DIAGNOSIS — E876 Hypokalemia: Secondary | ICD-10-CM

## 2016-03-12 MED ORDER — CLONAZEPAM 0.5 MG PO TABS
0.5000 mg | ORAL_TABLET | Freq: Two times a day (BID) | ORAL | Status: DC
  Administered 2016-03-13: 0.5 mg via ORAL
  Filled 2016-03-12: qty 1

## 2016-03-12 MED ORDER — RISPERIDONE 1 MG PO TABS
1.0000 mg | ORAL_TABLET | Freq: Two times a day (BID) | ORAL | Status: DC
  Administered 2016-03-13 (×2): 1 mg via ORAL
  Filled 2016-03-12 (×2): qty 1

## 2016-03-12 MED ORDER — SERTRALINE HCL 50 MG PO TABS
100.0000 mg | ORAL_TABLET | Freq: Every day | ORAL | Status: DC
  Administered 2016-03-13 – 2016-03-17 (×5): 100 mg via ORAL
  Filled 2016-03-12 (×5): qty 2

## 2016-03-12 MED ORDER — LEVOTHYROXINE SODIUM 25 MCG PO TABS
25.0000 ug | ORAL_TABLET | Freq: Every day | ORAL | Status: DC
  Administered 2016-03-13 – 2016-03-17 (×4): 25 ug via ORAL
  Filled 2016-03-12 (×5): qty 1

## 2016-03-12 MED ORDER — TAMSULOSIN HCL 0.4 MG PO CAPS
0.4000 mg | ORAL_CAPSULE | Freq: Every day | ORAL | Status: DC
  Administered 2016-03-13 – 2016-03-17 (×5): 0.4 mg via ORAL
  Filled 2016-03-12 (×6): qty 1

## 2016-03-12 MED ORDER — ALBUTEROL SULFATE HFA 108 (90 BASE) MCG/ACT IN AERS
2.0000 | INHALATION_SPRAY | Freq: Four times a day (QID) | RESPIRATORY_TRACT | Status: DC | PRN
  Administered 2016-03-14 – 2016-03-17 (×5): 2 via RESPIRATORY_TRACT
  Filled 2016-03-12: qty 6.7

## 2016-03-12 MED ORDER — TETANUS-DIPHTH-ACELL PERTUSSIS 5-2.5-18.5 LF-MCG/0.5 IM SUSP: 0.5000 mL | Freq: Once | INTRAMUSCULAR | Status: DC

## 2016-03-12 MED ORDER — ACETAMINOPHEN 500 MG PO TABS
500.0000 mg | ORAL_TABLET | Freq: Four times a day (QID) | ORAL | Status: DC | PRN
  Administered 2016-03-13 – 2016-03-17 (×8): 500 mg via ORAL
  Filled 2016-03-12 (×8): qty 1

## 2016-03-12 MED ORDER — ATORVASTATIN CALCIUM 40 MG PO TABS
40.0000 mg | ORAL_TABLET | Freq: Every day | ORAL | Status: DC
  Administered 2016-03-13 – 2016-03-16 (×4): 40 mg via ORAL
  Filled 2016-03-12 (×5): qty 1

## 2016-03-12 MED ORDER — LORAZEPAM 2 MG/ML IJ SOLN
2.0000 mg | Freq: Once | INTRAMUSCULAR | Status: AC
  Administered 2016-03-12: 2 mg via INTRAMUSCULAR
  Filled 2016-03-12: qty 1

## 2016-03-12 MED ORDER — MIRTAZAPINE 30 MG PO TABS
15.0000 mg | ORAL_TABLET | Freq: Every day | ORAL | Status: DC
  Administered 2016-03-13 – 2016-03-15 (×3): 15 mg via ORAL
  Filled 2016-03-12 (×4): qty 1

## 2016-03-12 MED ORDER — PANTOPRAZOLE SODIUM 40 MG PO TBEC
40.0000 mg | DELAYED_RELEASE_TABLET | Freq: Every day | ORAL | Status: DC
  Administered 2016-03-13 – 2016-03-17 (×5): 40 mg via ORAL
  Filled 2016-03-12 (×5): qty 1

## 2016-03-12 MED ORDER — HYDROXYZINE HCL 50 MG PO TABS
50.0000 mg | ORAL_TABLET | Freq: Two times a day (BID) | ORAL | Status: DC | PRN
  Administered 2016-03-13 – 2016-03-15 (×3): 50 mg via ORAL
  Filled 2016-03-12 (×4): qty 1

## 2016-03-12 MED ORDER — OXCARBAZEPINE 150 MG PO TABS
150.0000 mg | ORAL_TABLET | Freq: Two times a day (BID) | ORAL | Status: DC
  Administered 2016-03-13: 150 mg via ORAL
  Filled 2016-03-12: qty 1

## 2016-03-12 MED ORDER — DIVALPROEX SODIUM ER 500 MG PO TB24
500.0000 mg | ORAL_TABLET | Freq: Every morning | ORAL | Status: DC
Start: 2016-03-13 — End: 2016-03-13

## 2016-03-12 MED ORDER — TRAZODONE HCL 100 MG PO TABS
100.0000 mg | ORAL_TABLET | Freq: Every day | ORAL | Status: DC
  Administered 2016-03-13 – 2016-03-15 (×3): 100 mg via ORAL
  Filled 2016-03-12 (×4): qty 1
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
## 2016-03-12 NOTE — Progress Notes (Signed)
LCSWA spoke with Ladene ArtistDerrick about patient status, and possible discharge. He is concerned about patient ambulation.  Left Voicemail with patient Legal Guardian Sander RadonHeather Hicks.

## 2016-03-12 NOTE — Progress Notes (Signed)
Date: March 11, 2016 Discharge orders checked for needs. No needs present at time of discharge. Jahna Liebert, RN, BSN, CCM   [phone] 

## 2016-03-12 NOTE — ED Notes (Signed)
Bed: WA11 Expected date:  Expected time:  Means of arrival:  Comments: EMS 

## 2016-03-12 NOTE — Progress Notes (Signed)
CSW spoke with [facility] care taker, Jeff Foster [phone]., in regards to patient being discharged today and returning. [name] is concerned that patient may have a UTI  Because he has acted this way in the past. [name] is aware that patient can become aggressive but has never witnessed patient become this aggressive. [name] questioned if the physician could rule out a UTI or consider changing some of his medications. Patient will be able to return to [facility]. [name] would like to be updated with any changes and when patient is stable for discharge.   CSW attempted to contact patients legal guardian, Jeff Foster [phone], with no success. CSW is awaiting return call.   Jeff GardnerErin Kaniel Foster, LCSWA Clinical Social Worker [phone]
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
## 2016-03-12 NOTE — ED Notes (Signed)
Pt continues to yell and scream.  Pt refused all his due medications, pt yelled "I'm not taking my medications!  I'm not taking my medications!"

## 2016-03-12 NOTE — Progress Notes (Signed)
Left voicemail with patient caretaker Ladene ArtistDerrick

## 2016-03-12 NOTE — Discharge Summary (Signed)
Physician Discharge Summary  Jeff ChurnJohn Foster RUE:[id number] DOB: [birth year] DOA: 03/10/2016  PCP: Diamantina Providenceakela N Anderson, FNP  Admit date: 03/10/2016 Discharge date: 03/12/2016  Admitted From: ALF Disposition: ALF  Recommendations for Outpatient Follow-up:  1. Follow up with PCP in 1 weeks   Home Health: NONE  Equipment/Devices: none Discharge Condition: fair CODE STATUS: full code Diet recommendation: regular    Discharge Diagnoses:  Principal Problem:   Aggressive behavior, adult   Active Problems:   Mental retardation   Seizure (HCC)   Urinary retention  brief narrative 55 year old male with seizure disorder, psychosis, mental retardation brought to the ED by EMS after he fell at the group home. He was on 2. distended to aggressive behavior and restlessness. In the ED her vitals were stable, labs unremarkable. His induction was negative, head CT and neck without acute abnormality. CT abdomen showed a slightly discrete fracture of the left lateral ninth rib which seemed recent. He was given 2 doses of IV Haldol, lorazepam and Geodon. He was found to have acute urinary retention and recommended observation on the hospitalist service. Following admission patient was extremely agitated requiring point restrains and bedside sitter. He received a total of 30 mg of Geodon on the day of admission.   Principal problem Acute urinary retention Suspect his psychiatric medications playing a role.  CT negative for urinary retention, hydronephrosis or enlarged prostate. Continued flomax. Foley removed and voiding without difficulty.   Acute psychosis Received when  necessary Ativan and risperidone. Received  added Geodon prn.  Psych consult appreciated. Recommended to continue current home meds.recommended  Pt to be  at baseline and doesnot ned inpatient psych admission.    prolonged QTC  on admission. monitored on tele. subsequent qtc stable. . Replaced low potassium and  magnesium.  Seizure disorder Continue home dose Depakote and Trileptal.  Hypothyroidism Continue Synthroid  Anxiety Continue hydroxyzine and ativan.  Hypertension Not on any blood pressure medications at home. stable  Thrombocytopenia Chronic, platelets of 122. Stable.  Fall at assist living Psychiatric medication may be playing a role. Imaging shows lateral left ninth rib and transverse fracture of right L3 and L4 without surrounding hematoma.not sure if medication contributing. orthostasis negative.  Will order PT evaluation and follow up at the facility if available.  stable for discharge.     Family Communication  : None at bedside  Disposition Plan  : return to ALF  Consults  :  Psychiatry  Procedures  : CT head  Discharge Instructions     Medication List    STOP taking these medications   food thickener Powd Commonly known as:  THICK IT   ziprasidone injection Commonly known as:  GEODON     TAKE these medications   acetaminophen 500 MG tablet Commonly known as:  TYLENOL Take 500 mg by mouth every 6 (six) hours as needed for mild pain, moderate pain, fever or headache.   albuterol 108 (90 Base) MCG/ACT inhaler Commonly known as:  PROVENTIL HFA;VENTOLIN HFA Inhale 2 puffs into the lungs every 6 (six) hours as needed for wheezing or shortness of breath.   atorvastatin 40 MG tablet Commonly known as:  LIPITOR Take 40 mg by mouth at bedtime.   clonazePAM 0.5 MG tablet Commonly known as:  KLONOPIN Take 1 tablet (0.5 mg total) by mouth 2 (two) times daily. What changed:  when to take this   divalproex 500 MG 24 hr tablet Commonly known as:  DEPAKOTE ER Take 1 tablet (500 mg total)  by mouth every morning. What changed:  Another medication with the same name was removed. Continue taking this medication, and follow the directions you see here.   hydrOXYzine 50 MG capsule Commonly known as:  VISTARIL Take 50 mg by mouth 2 (two) times  daily as needed for anxiety.   levothyroxine 25 MCG tablet Commonly known as:  SYNTHROID, LEVOTHROID Take 25 mcg by mouth daily before breakfast.   mirtazapine 15 MG tablet Commonly known as:  REMERON Take 15 mg by mouth at bedtime.   omeprazole 20 MG capsule Commonly known as:  PRILOSEC Take 20 mg by mouth daily.   OXcarbazepine 150 MG tablet Commonly known as:  TRILEPTAL Take 150 mg by mouth 2 (two) times daily. What changed:  Another medication with the same name was removed. Continue taking this medication, and follow the directions you see here.   risperiDONE 1 MG tablet Commonly known as:  RISPERDAL Take 1 tablet (1 mg total) by mouth 2 (two) times daily.   sertraline 100 MG tablet Commonly known as:  ZOLOFT Take 1 tablet (100 mg total) by mouth daily. What changed:  Another medication with the same name was removed. Continue taking this medication, and follow the directions you see here.   tamsulosin 0.4 MG Caps capsule Commonly known as:  FLOMAX Take 0.4 mg by mouth at bedtime.   traZODone 50 MG tablet Commonly known as:  DESYREL Take 100 mg by mouth at bedtime.      Follow-up Information    Diamantina Providence, FNP. Schedule an appointment as soon as possible for a visit in 1 week(s).   Specialty:  Nurse Practitioner Contact information: [address] [place] [address] [phone]          Allergies  Allergen Reactions  . Other Other (See Comments)    clorox bleach- unknown reaction  . Peanut-Containing Drug Products Diarrhea        Procedures/Studies: Dg Chest 2 View  Result Date: 03/04/2016 CLINICAL DATA:  Pre psychiatric admission. EXAM: CHEST  2 VIEW COMPARISON:  12/03/2015 FINDINGS: The cardiomediastinal silhouette is unremarkable. Mild peribronchial thickening is unchanged. There is no evidence of focal airspace disease, pulmonary edema, suspicious pulmonary nodule/mass, pleural effusion, or pneumothorax. No acute bony  abnormalities are identified. IMPRESSION: No evidence of acute cardiopulmonary disease. Mild chronic peribronchial thickening. Electronically Signed   By: Harmon Pier M.D.   On: 03/04/2016 18:27   Ct Head Wo Contrast  Result Date: 03/10/2016 CLINICAL DATA:  Fall, facial bruising EXAM: CT HEAD WITHOUT CONTRAST CT MAXILLOFACIAL WITHOUT CONTRAST CT CERVICAL SPINE WITHOUT CONTRAST TECHNIQUE: Multidetector CT imaging of the head, cervical spine, and maxillofacial structures were performed using the standard protocol without intravenous contrast. Multiplanar CT image reconstructions of the cervical spine and maxillofacial structures were also generated. COMPARISON:  03/04/2016 FINDINGS: CT HEAD FINDINGS Brain: No intracranial hemorrhage, mass effect or midline shift. Mild periventricular white matter decreased attenuation probable due to chronic small vessel ischemic changes. No definite acute cortical infarction. No mass lesion is noted on this unenhanced scan. Vascular: No hyperdense vessel or unexpected calcification. Skull: No skull fracture is noted. Again noted comminuted fracture bilateral nasal bone. Sinuses/Orbits: No paranasal sinuses air-fluid levels. The mastoid air cells are unremarkable. Other: None CT MAXILLOFACIAL FINDINGS Osseous: No mandibular fracture is noted. Again noted comminuted mild displaced fractures bilateral nasal bone. Mild mucosal thickening with partial opacification bilateral ethmoid air cells. Mild mucosal thickening bilateral frontal sinus. No intraorbital hematoma. Bilateral eye globe  is symmetrical in appearance. There is subtle mild soft tissue swelling and subcutaneous stranding left face. No facial hematoma. Persistent mild soft tissue swelling perinasal region. There is no zygomatic fracture. Stable mild deformity of right zygoma. Coronal images shows no orbital rim or orbital floor fracture. There is mucosal thickening inferior aspect of the left maxillary sinus. No TMJ  dislocation. There is mild left deviation of inferior aspect of nasal bony septum. Minimal right deviation superior aspect of bony nasal septum. Bilateral semilunar canal is patent. Mild mucosal thickening inferior aspect of the right maxillary sinus. The nasal turbinates are unremarkable. Sagittal images shows no maxillary spine fracture. The nasopharyngeal and oropharyngeal airway is patent. The patient is edentulous. CT CERVICAL SPINE FINDINGS Alignment: Sagittal images of the spine shows normal alignment. Axial images shows no acute fracture or subluxation. Skull base and vertebrae: Vertebral body heights preserved. Soft tissues and spinal canal: Spinal canal is patent. No prevertebral soft tissue swelling. Disc levels: There are degenerative changes C1-C2 articulation. Disc space flattening with minimal anterior spurring at C3-C4 level. Moderate disc space flattening with anterior spurring mild posterior spurring and endplate sclerotic changes at C4-C5 and C5-C6 level. There is mild disc space flattening with mild anterior and mild posterior spurring at C6-C7 level. Upper chest: There is no pneumothorax in visualized lung apices. Mild emphysematous changes bilateral lung apices. Mild thickening of upper esophageal wall. Gastroesophageal reflux disease cannot be excluded. Other: None IMPRESSION: 1. There is no acute intracranial abnormality. Mild periventricular white matter decreased attenuation probable due to chronic small vessel ischemic changes. 2. Again noted mild displaced comminuted fractures bilateral nasal bone. Mild perinasal soft tissue swelling with improvement from prior exam. 3. Subtle mild soft tissue swelling and subcutaneous stranding left face. No facial hematoma. 4. No mandibular fracture. No zygomatic fracture. No TMJ dislocation. No maxillary spine fracture. 5. No intraorbital hematoma. No orbital rim or orbital floor fracture. 6. No cervical spine acute fracture or subluxation. Multilevel  degenerative changes as described above. 7. Mild emphysematous changes bilateral lung apices. 8. Paranasal sinuses disease as described above. 9. Mild thickening of upper esophageal wall. Gastroesophageal reflux cannot be excluded. Electronically Signed   By: Natasha Mead M.D.   On: 03/10/2016 11:49   Ct Head Wo Contrast  Result Date: 03/04/2016 CLINICAL DATA:  55 year old male with altered mental status and facial pain and swelling following facial injury. EXAM: CT HEAD WITHOUT CONTRAST CT MAXILLOFACIAL WITHOUT CONTRAST TECHNIQUE: Multidetector CT imaging of the head and maxillofacial structures were performed using the standard protocol without intravenous contrast. Multiplanar CT image reconstructions of the maxillofacial structures were also generated. COMPARISON:  12/03/2015 and 07/18/2015 CTs FINDINGS: CT HEAD FINDINGS Chronic small-vessel white matter ischemic changes are again identified. No acute intracranial abnormalities are identified, including mass lesion or mass effect, hydrocephalus, extra-axial fluid collection, midline shift, hemorrhage, or acute infarction. The skull is unremarkable. CT MAXILLOFACIAL FINDINGS Mildly comminuted bilateral nasal fractures are present with overlying soft tissue swelling. Anterior subluxation/dislocation of the mandibular condyle at the right TMJ noted. Remote fractures of the nasal septum and right zygoma again noted. No other fracture, subluxation or dislocation identified. Mucosal thickening in scattered anterior ethmoid air cells and left maxillary sinus noted. The mastoid air cells and middle/inner ears are clear. The orbits and globes are unremarkable. Moderate degenerative disc disease and spondylosis from C4-C7 noted. IMPRESSION: Mildly comminuted bilateral nasal fractures with overlying soft tissue swelling. Anterior subluxation/dislocation at the right TMJ. No evidence of acute intracranial abnormality. Chronic small-vessel  white matter ischemic changes.  Electronically Signed   By: Harmon Pier M.D.   On: 03/04/2016 14:43   Ct Cervical Spine Wo Contrast  Result Date: 03/10/2016 CLINICAL DATA:  Fall, facial bruising EXAM: CT HEAD WITHOUT CONTRAST CT MAXILLOFACIAL WITHOUT CONTRAST CT CERVICAL SPINE WITHOUT CONTRAST TECHNIQUE: Multidetector CT imaging of the head, cervical spine, and maxillofacial structures were performed using the standard protocol without intravenous contrast. Multiplanar CT image reconstructions of the cervical spine and maxillofacial structures were also generated. COMPARISON:  03/04/2016 FINDINGS: CT HEAD FINDINGS Brain: No intracranial hemorrhage, mass effect or midline shift. Mild periventricular white matter decreased attenuation probable due to chronic small vessel ischemic changes. No definite acute cortical infarction. No mass lesion is noted on this unenhanced scan. Vascular: No hyperdense vessel or unexpected calcification. Skull: No skull fracture is noted. Again noted comminuted fracture bilateral nasal bone. Sinuses/Orbits: No paranasal sinuses air-fluid levels. The mastoid air cells are unremarkable. Other: None CT MAXILLOFACIAL FINDINGS Osseous: No mandibular fracture is noted. Again noted comminuted mild displaced fractures bilateral nasal bone. Mild mucosal thickening with partial opacification bilateral ethmoid air cells. Mild mucosal thickening bilateral frontal sinus. No intraorbital hematoma. Bilateral eye globe is symmetrical in appearance. There is subtle mild soft tissue swelling and subcutaneous stranding left face. No facial hematoma. Persistent mild soft tissue swelling perinasal region. There is no zygomatic fracture. Stable mild deformity of right zygoma. Coronal images shows no orbital rim or orbital floor fracture. There is mucosal thickening inferior aspect of the left maxillary sinus. No TMJ dislocation. There is mild left deviation of inferior aspect of nasal bony septum. Minimal right deviation superior aspect  of bony nasal septum. Bilateral semilunar canal is patent. Mild mucosal thickening inferior aspect of the right maxillary sinus. The nasal turbinates are unremarkable. Sagittal images shows no maxillary spine fracture. The nasopharyngeal and oropharyngeal airway is patent. The patient is edentulous. CT CERVICAL SPINE FINDINGS Alignment: Sagittal images of the spine shows normal alignment. Axial images shows no acute fracture or subluxation. Skull base and vertebrae: Vertebral body heights preserved. Soft tissues and spinal canal: Spinal canal is patent. No prevertebral soft tissue swelling. Disc levels: There are degenerative changes C1-C2 articulation. Disc space flattening with minimal anterior spurring at C3-C4 level. Moderate disc space flattening with anterior spurring mild posterior spurring and endplate sclerotic changes at C4-C5 and C5-C6 level. There is mild disc space flattening with mild anterior and mild posterior spurring at C6-C7 level. Upper chest: There is no pneumothorax in visualized lung apices. Mild emphysematous changes bilateral lung apices. Mild thickening of upper esophageal wall. Gastroesophageal reflux disease cannot be excluded. Other: None IMPRESSION: 1. There is no acute intracranial abnormality. Mild periventricular white matter decreased attenuation probable due to chronic small vessel ischemic changes. 2. Again noted mild displaced comminuted fractures bilateral nasal bone. Mild perinasal soft tissue swelling with improvement from prior exam. 3. Subtle mild soft tissue swelling and subcutaneous stranding left face. No facial hematoma. 4. No mandibular fracture. No zygomatic fracture. No TMJ dislocation. No maxillary spine fracture. 5. No intraorbital hematoma. No orbital rim or orbital floor fracture. 6. No cervical spine acute fracture or subluxation. Multilevel degenerative changes as described above. 7. Mild emphysematous changes bilateral lung apices. 8. Paranasal sinuses disease  as described above. 9. Mild thickening of upper esophageal wall. Gastroesophageal reflux cannot be excluded. Electronically Signed   By: Natasha Mead M.D.   On: 03/10/2016 11:49   Ct Abdomen Pelvis W Contrast  Result Date: 03/10/2016 CLINICAL DATA:  Generalized abdominal pain EXAM: CT ABDOMEN AND PELVIS WITH CONTRAST TECHNIQUE: Multidetector CT imaging of the abdomen and pelvis was performed using the standard protocol following bolus administration of intravenous contrast. CONTRAST:  ISOVUE-300 IOPAMIDOL (ISOVUE-300) INJECTION 61% COMPARISON:  None. FINDINGS: Lower chest: There is no lung base edema or consolidation. On axial slice 1 series 6, there is a 4 mm nodular opacity in the superior segment of the right lower lobe. Hepatobiliary: There is fatty infiltration near the fissure for the ligamentum teres. No focal liver lesions are evident. The gallbladder is borderline distended without wall thickening. There is no biliary duct dilatation. Pancreas: No pancreatic mass or inflammatory focus. Spleen: No splenic lesions are evident. Adrenals/Urinary Tract: Adrenals appear normal bilaterally. Kidneys bilaterally show no appreciable mass or hydronephrosis on either side. There is no renal or ureteral calculus on either side. Urinary bladder is somewhat distended. There is no urinary bladder wall thickening. Stomach/Bowel: There is no appreciable bowel wall or mesenteric thickening. There is no appreciable bowel obstruction. No free air or portal venous air is evident. Vascular/Lymphatic: There is atherosclerotic calcification in the aorta and common iliac arteries. Calcification also extends into both hypogastric arteries. There is no abdominal aortic aneurysm. The major mesenteric vessels appear patent. There is no demonstrable adenopathy in the abdomen or pelvis. Reproductive: Prostate and seminal vesicles appear normal in size and contour. There are a few tiny prostatic calculi. Other: Appendix appears  unremarkable. There is no abscess or ascites in the abdomen or pelvis. Musculoskeletal: There is a mildly displaced fracture of the left lateral ninth rib. There are displaced fractures of the right L3 and L4 there is no surrounding hematoma. Transverse processes. There is degenerative change in the lumbar spine. There is broad-based disc bulging at L4-5 and L5-S1. There are no blastic or lytic bone lesions. There is no intramuscular or abdominal wall lesion. IMPRESSION: Slightly displaced fracture of the lateral left ninth rib which appears recent. Fractures of the right L3 and L4 transverse processes without surrounding hematoma. Urinary bladder is distended without wall thickening. No renal or ureteral calculi. No hydronephrosis. Prostate does not appear enlarged. Gallbladder borderline distended without wall thickening. No bowel obstruction.  No abscess.  Appendix region appears normal. There is aortic and iliac artery atherosclerosis. Electronically Signed   By: Bretta Bang III M.D.   On: 03/10/2016 14:07   Ct Maxillofacial Wo Cm  Result Date: 03/10/2016 CLINICAL DATA:  Fall, facial bruising EXAM: CT HEAD WITHOUT CONTRAST CT MAXILLOFACIAL WITHOUT CONTRAST CT CERVICAL SPINE WITHOUT CONTRAST TECHNIQUE: Multidetector CT imaging of the head, cervical spine, and maxillofacial structures were performed using the standard protocol without intravenous contrast. Multiplanar CT image reconstructions of the cervical spine and maxillofacial structures were also generated. COMPARISON:  03/04/2016 FINDINGS: CT HEAD FINDINGS Brain: No intracranial hemorrhage, mass effect or midline shift. Mild periventricular white matter decreased attenuation probable due to chronic small vessel ischemic changes. No definite acute cortical infarction. No mass lesion is noted on this unenhanced scan. Vascular: No hyperdense vessel or unexpected calcification. Skull: No skull fracture is noted. Again noted comminuted fracture  bilateral nasal bone. Sinuses/Orbits: No paranasal sinuses air-fluid levels. The mastoid air cells are unremarkable. Other: None CT MAXILLOFACIAL FINDINGS Osseous: No mandibular fracture is noted. Again noted comminuted mild displaced fractures bilateral nasal bone. Mild mucosal thickening with partial opacification bilateral ethmoid air cells. Mild mucosal thickening bilateral frontal sinus. No intraorbital hematoma. Bilateral eye globe is symmetrical in appearance. There is subtle mild soft tissue swelling  and subcutaneous stranding left face. No facial hematoma. Persistent mild soft tissue swelling perinasal region. There is no zygomatic fracture. Stable mild deformity of right zygoma. Coronal images shows no orbital rim or orbital floor fracture. There is mucosal thickening inferior aspect of the left maxillary sinus. No TMJ dislocation. There is mild left deviation of inferior aspect of nasal bony septum. Minimal right deviation superior aspect of bony nasal septum. Bilateral semilunar canal is patent. Mild mucosal thickening inferior aspect of the right maxillary sinus. The nasal turbinates are unremarkable. Sagittal images shows no maxillary spine fracture. The nasopharyngeal and oropharyngeal airway is patent. The patient is edentulous. CT CERVICAL SPINE FINDINGS Alignment: Sagittal images of the spine shows normal alignment. Axial images shows no acute fracture or subluxation. Skull base and vertebrae: Vertebral body heights preserved. Soft tissues and spinal canal: Spinal canal is patent. No prevertebral soft tissue swelling. Disc levels: There are degenerative changes C1-C2 articulation. Disc space flattening with minimal anterior spurring at C3-C4 level. Moderate disc space flattening with anterior spurring mild posterior spurring and endplate sclerotic changes at C4-C5 and C5-C6 level. There is mild disc space flattening with mild anterior and mild posterior spurring at C6-C7 level. Upper chest: There  is no pneumothorax in visualized lung apices. Mild emphysematous changes bilateral lung apices. Mild thickening of upper esophageal wall. Gastroesophageal reflux disease cannot be excluded. Other: None IMPRESSION: 1. There is no acute intracranial abnormality. Mild periventricular white matter decreased attenuation probable due to chronic small vessel ischemic changes. 2. Again noted mild displaced comminuted fractures bilateral nasal bone. Mild perinasal soft tissue swelling with improvement from prior exam. 3. Subtle mild soft tissue swelling and subcutaneous stranding left face. No facial hematoma. 4. No mandibular fracture. No zygomatic fracture. No TMJ dislocation. No maxillary spine fracture. 5. No intraorbital hematoma. No orbital rim or orbital floor fracture. 6. No cervical spine acute fracture or subluxation. Multilevel degenerative changes as described above. 7. Mild emphysematous changes bilateral lung apices. 8. Paranasal sinuses disease as described above. 9. Mild thickening of upper esophageal wall. Gastroesophageal reflux cannot be excluded. Electronically Signed   By: Natasha Mead M.D.   On: 03/10/2016 11:49   Ct Maxillofacial Wo Contrast  Result Date: 03/04/2016 CLINICAL DATA:  55 year old male with altered mental status and facial pain and swelling following facial injury. EXAM: CT HEAD WITHOUT CONTRAST CT MAXILLOFACIAL WITHOUT CONTRAST TECHNIQUE: Multidetector CT imaging of the head and maxillofacial structures were performed using the standard protocol without intravenous contrast. Multiplanar CT image reconstructions of the maxillofacial structures were also generated. COMPARISON:  12/03/2015 and 07/18/2015 CTs FINDINGS: CT HEAD FINDINGS Chronic small-vessel white matter ischemic changes are again identified. No acute intracranial abnormalities are identified, including mass lesion or mass effect, hydrocephalus, extra-axial fluid collection, midline shift, hemorrhage, or acute infarction. The  skull is unremarkable. CT MAXILLOFACIAL FINDINGS Mildly comminuted bilateral nasal fractures are present with overlying soft tissue swelling. Anterior subluxation/dislocation of the mandibular condyle at the right TMJ noted. Remote fractures of the nasal septum and right zygoma again noted. No other fracture, subluxation or dislocation identified. Mucosal thickening in scattered anterior ethmoid air cells and left maxillary sinus noted. The mastoid air cells and middle/inner ears are clear. The orbits and globes are unremarkable. Moderate degenerative disc disease and spondylosis from C4-C7 noted. IMPRESSION: Mildly comminuted bilateral nasal fractures with overlying soft tissue swelling. Anterior subluxation/dislocation at the right TMJ. No evidence of acute intracranial abnormality. Chronic small-vessel white matter ischemic changes. Electronically Signed   By: Tinnie Gens  Hu M.D.   On: 03/04/2016 14:43    (Echo, Carotid, EGD, Colonoscopy, ERCP)    Subjective:   Discharge Exam: Vitals:   03/11/16 2104 03/12/16 0600  BP: 138/70 (!) 144/81  Pulse: 70   Resp: 18 18  Temp: 97.7 F (36.5 C) 98.5 F (36.9 C)   Vitals:   03/11/16 1716 03/11/16 1800 03/11/16 2104 03/12/16 0600  BP:   138/70 (!) 144/81  Pulse: 73  70   Resp: 18  18 18   Temp:   97.7 F (36.5 C) 98.5 F (36.9 C)  TempSrc:   Oral Oral  SpO2: 96%  99% 97%  Weight:  99.5 kg (219 lb 5.7 oz)  97 kg (213 lb 13.5 oz)  Height:  6\' 2"  (1.88 m)      Gen: restless, no agitation HEENT: moist mucosa, supple neck Chest: clear b/l, no added sounds CVS: N S1&S2, no murmurs,  GI: soft, NT, ND, BS+ Musculoskeletal: warm, no edema,  CNS:  restless, poorly communicative   The results of significant diagnostics from this hospitalization (including imaging, microbiology, ancillary and laboratory) are listed below for reference.     Microbiology: No results found for this or any previous visit (from the past 240 hour(s)).   Labs: BNP  (last 3 results) No results for input(s): BNP in the last 8760 hours. Basic Metabolic Panel:  Recent Labs Lab 03/10/16 0621 03/11/16 0520  NA 135 140  K 3.5 3.4*  CL 102 104  CO2 26 29  GLUCOSE 110* 90  BUN 7 7  CREATININE 0.66 0.60*  CALCIUM 8.9 8.9  MG  --  1.9   Liver Function Tests:  Recent Labs Lab 03/10/16 0621  AST 32  ALT 23  ALKPHOS 90  BILITOT 1.0  PROT 7.2  ALBUMIN 4.1   No results for input(s): LIPASE, AMYLASE in the last 168 hours. No results for input(s): AMMONIA in the last 168 hours. CBC:  Recent Labs Lab 03/10/16 0621 03/11/16 0520  WBC 6.9 7.0  NEUTROABS 3.8  --   HGB 10.8* 10.6*  HCT 31.3* 31.6*  MCV 82.8 84.7  PLT 109* 122*   Cardiac Enzymes: No results for input(s): CKTOTAL, CKMB, CKMBINDEX, TROPONINI in the last 168 hours. BNP: Invalid input(s): POCBNP CBG: No results for input(s): GLUCAP in the last 168 hours. D-Dimer No results for input(s): DDIMER in the last 72 hours. Hgb A1c No results for input(s): HGBA1C in the last 72 hours. Lipid Profile No results for input(s): CHOL, HDL, LDLCALC, TRIG, CHOLHDL, LDLDIRECT in the last 72 hours. Thyroid function studies  Recent Labs  03/11/16 0520  TSH 0.522   Anemia work up No results for input(s): VITAMINB12, FOLATE, FERRITIN, TIBC, IRON, RETICCTPCT in the last 72 hours. Urinalysis    Component Value Date/Time   COLORURINE YELLOW 03/10/2016 1410   APPEARANCEUR CLEAR 03/10/2016 1410   LABSPEC 1.019 03/10/2016 1410   PHURINE 7.5 03/10/2016 1410   GLUCOSEU NEGATIVE 03/10/2016 1410   HGBUR NEGATIVE 03/10/2016 1410   BILIRUBINUR NEGATIVE 03/10/2016 1410   KETONESUR NEGATIVE 03/10/2016 1410   PROTEINUR NEGATIVE 03/10/2016 1410   NITRITE NEGATIVE 03/10/2016 1410   LEUKOCYTESUR NEGATIVE 03/10/2016 1410   Sepsis Labs Invalid input(s): PROCALCITONIN,  WBC,  LACTICIDVEN Microbiology No results found for this or any previous visit (from the past 240 hour(s)).   Time  coordinating discharge: > 30 minutes  SIGNED:   Eddie North, MD  [facility] 03/12/2016, 1:58 PM Pager   If 7PM-7AM, please contact night-coverage  [email or web address] Password [email or web address]

## 2016-03-12 NOTE — ED Notes (Signed)
Delay in lab draw, pt is aggressive towards staff

## 2016-03-12 NOTE — ED Provider Notes (Signed)
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 standard protocol without intravenous contrast. Multiplanar CT image reconstructions of the maxillofacial structures were also generated. COMPARISON:  12/03/2015 and 07/18/2015 CTs FINDINGS: CT HEAD FINDINGS Chronic small-vessel white matter ischemic changes are again identified. No acute intracranial abnormalities are identified, including mass lesion or mass effect, hydrocephalus, extra-axial fluid collection, midline shift, hemorrhage, or acute infarction. The skull is unremarkable. CT MAXILLOFACIAL FINDINGS Mildly comminuted bilateral nasal fractures are present with overlying soft tissue swelling. Anterior subluxation/dislocation of the mandibular condyle at the right TMJ noted. Remote fractures of the nasal septum and right zygoma again noted. No other fracture, subluxation or dislocation identified. Mucosal thickening in scattered anterior ethmoid air cells and left maxillary sinus noted. The mastoid air cells and middle/inner ears are clear. The orbits and globes are unremarkable. Moderate degenerative disc disease and spondylosis from C4-C7 noted. IMPRESSION: Mildly comminuted bilateral nasal fractures with overlying soft tissue swelling. Anterior subluxation/dislocation at the right TMJ. No evidence of  acute intracranial abnormality. Chronic small-vessel white matter ischemic changes. Electronically Signed   By: Harmon Pier M.D.   On: 03/04/2016 14:43    Radiology No results found.  Procedures Procedures (including critical care time)  Medications Ordered in ED Medications  acetaminophen (TYLENOL) tablet 500 mg (not administered)  albuterol (PROVENTIL HFA;VENTOLIN HFA) 108 (90 Base) MCG/ACT inhaler 2 puff (not administered)  atorvastatin (LIPITOR) tablet 40 mg (not administered)  clonazePAM (KLONOPIN) tablet 0.5 mg (not administered)  divalproex (DEPAKOTE ER) 24 hr tablet 500 mg (not administered)  hydrOXYzine (ATARAX/VISTARIL) tablet 50 mg (not administered)  levothyroxine (SYNTHROID, LEVOTHROID) tablet 25 mcg (not administered)  mirtazapine (REMERON) tablet 15 mg (not administered)  pantoprazole (PROTONIX) EC tablet 40 mg (not administered)  OXcarbazepine (TRILEPTAL) tablet 150 mg (not administered)  risperiDONE (RISPERDAL) tablet 1 mg (not administered)  sertraline (ZOLOFT) tablet 100 mg (not administered)  tamsulosin (FLOMAX) capsule 0.4 mg (not administered)  traZODone (DESYREL) tablet 100 mg (not administered)  Tdap (BOOSTRIX) injection 0.5 mL (not administered)     Initial Impression / Assessment and Plan / ED Course  I have reviewed the triage vital signs and the nursing notes.  Pertinent labs & imaging results that were available during my care of the patient were reviewed by me and considered in my medical decision making (see chart for details).  Clinical Course    TTS consulted and psychiatry consulted to adjust medications. Social service was consulted. I spoke with [name], manage her group home was willing to accept patient back. Patient will require behavioral management to help prevent violent outbursts Patient is medically clear Final Clinical Impressions(s) / ED Diagnoses  Diagnosis aggressive behavior Final diagnoses:  None    New  Prescriptions New Prescriptions   No medications on file     Doug Sou, MD 03/13/16 0139

## 2016-03-12 NOTE — Progress Notes (Addendum)
FL2 and DC faxed to [phone] via Epic. Ladene ArtistDerrick reports caretaker Rosey Batheresa [phone] will pick patient up today.  Placed two calls again to [name] left two voicemail's to inform patient is ready for pick up.  Legal Guardian Heather to call at 5:00pm

## 2016-03-12 NOTE — NC FL2 (Signed)
[place] MEDICAID FL2 LEVEL OF CARE SCREENING TOOL     IDENTIFICATION  Patient Name: Jeff Foster Birthdate: [birth year] Sex: male Admission Date (Current Location): 03/10/2016  [facility] and [place] Number:  Producer, television/film/video and Address:  [facility],  [address], [place] [address]      Provider Number: [id number]  Attending Physician Name and Address:  Eddie North, MD  Relative Name and Phone Number:       Current Level of Care: Hospital Recommended Level of Care: Assisted Living Facility Prior Approval Number:    Date Approved/Denied:   PASRR Number:    Discharge Plan: Other (Comment) (ALF)    Current Diagnoses: Patient Active Problem List   Diagnosis Date Noted  . Urinary retention 03/10/2016  . Aggressive behavior, adult 03/06/2016  . Essential hypertension   . Post-ictal confusion 10/27/2015  . Hypothyroidism 10/27/2015  . Thrombocytopenia (HCC) 10/27/2015  . Acute respiratory failure with hypoxia (HCC) 10/27/2015  . Seizure (HCC) 10/27/2015  . Psychosis 10/27/2015  . Normocytic anemia 10/27/2015  . DNR (do not resuscitate) 07/25/2015  . Aspiration pneumonia (HCC) 07/25/2015  . Palliative care encounter 07/25/2015  . Dysphagia   . Altered mental status   . UTI (lower urinary tract infection) 07/18/2015  . Cognitive decline 07/18/2015  . Acute encephalopathy 07/18/2015  . Mental retardation 07/18/2015  . Hypertension 07/18/2015  . History of CVA (cerebrovascular accident) 07/18/2015  . Encephalopathy acute 07/18/2015  . Dehydration     Orientation RESPIRATION BLADDER Height & Weight     Self  Normal Continent Weight: 213 lb 13.5 oz (97 kg) Height:  6\' 2"  (188 cm)  BEHAVIORAL SYMPTOMS/MOOD NEUROLOGICAL BOWEL NUTRITION STATUS  Other (Comment) (Pt.has outburst but more incontrol of his beahvior now)   Continent Diet  AMBULATORY STATUS COMMUNICATION OF NEEDS Skin   Limited Assist Verbally Bruising                        Personal Care Assistance Level of Assistance  Bathing, Feeding, Dressing Bathing Assistance: Limited assistance Feeding assistance: Limited assistance Dressing Assistance: Limited assistance     Functional Limitations Info  Sight, Hearing, Speech Sight Info: Adequate Hearing Info: Adequate Speech Info: Adequate    SPECIAL CARE FACTORS FREQUENCY                       Contractures Contractures Info: Not present    Additional Factors Info  Code Status, Allergies Code Status Info: Full Code Allergies Info: Other, Peanut-containing Drug Products           Current Medications (03/12/2016):  This is the current hospital active medication list Current Facility-Administered Medications  Medication Dose Route Frequency Provider Last Rate Last Dose  . acetaminophen (TYLENOL) tablet 500 mg  500 mg Oral Q6H PRN Almon Hercules, MD   500 mg at 03/12/16 1151  . albuterol (PROVENTIL) (2.5 MG/3ML) 0.083% nebulizer solution 3 mL  3 mL Inhalation Q6H PRN Almon Hercules, MD   3 mL at 03/11/16 1714  . atorvastatin (LIPITOR) tablet 40 mg  40 mg Oral QHS Almon Hercules, MD   40 mg at 03/11/16 2122  . divalproex (DEPAKOTE ER) 24 hr tablet 500 mg  500 mg Oral q morning - 10a Almon Hercules, MD   500 mg at 03/12/16 1049  . enoxaparin (LOVENOX) injection 40 mg  40 mg Subcutaneous Daily Otho Bellows, RPH   40 mg at 03/12/16  1050  . levothyroxine (SYNTHROID, LEVOTHROID) tablet 25 mcg  25 mcg Oral QAC breakfast Almon Herculesaye T Gonfa, MD   25 mcg at 03/12/16 1049  . LORazepam (ATIVAN) injection 2 mg  2 mg Intravenous Q6H PRN Leda GauzeKaren J Kirby-Graham, NP   2 mg at 03/12/16 0655  . mirtazapine (REMERON) tablet 15 mg  15 mg Oral QHS Almon Herculesaye T Gonfa, MD   15 mg at 03/11/16 2122  . OXcarbazepine (TRILEPTAL) tablet 150 mg  150 mg Oral BID Almon Herculesaye T Gonfa, MD   150 mg at 03/12/16 1050  . pantoprazole (PROTONIX) EC tablet 40 mg  40 mg Oral Daily Almon Herculesaye T Gonfa, MD   40 mg at 03/12/16 1049  . risperiDONE (RISPERDAL) tablet 1 mg  1 mg  Oral BID Almon Herculesaye T Gonfa, MD   1 mg at 03/12/16 1050  . tamsulosin (FLOMAX) capsule 0.4 mg  0.4 mg Oral QHS Almon Herculesaye T Gonfa, MD   0.4 mg at 03/11/16 2123  . traZODone (DESYREL) tablet 100 mg  100 mg Oral QHS Almon Herculesaye T Gonfa, MD   100 mg at 03/11/16 2122  . ziprasidone (GEODON) injection 10 mg  10 mg Intramuscular Q2H PRN Leata MouseJanardhana Jonnalagadda, MD         Discharge Medications: Please see discharge summary for a list of discharge medications.  Relevant Imaging Results:  Relevant Lab Results:   Additional Information ss#[id number]  Clearance CootsNicole A Harlan Ervine, LCSW

## 2016-03-13 DIAGNOSIS — F6089 Other specific personality disorders: Secondary | ICD-10-CM

## 2016-03-13 DIAGNOSIS — R45851 Suicidal ideations: Secondary | ICD-10-CM

## 2016-03-13 DIAGNOSIS — F6381 Intermittent explosive disorder: Secondary | ICD-10-CM | POA: Diagnosis not present

## 2016-03-13 DIAGNOSIS — F411 Generalized anxiety disorder: Secondary | ICD-10-CM | POA: Diagnosis present

## 2016-03-13 LAB — I-STAT CHEM 8, ED
BUN: <3 mg/dL — ABNORMAL LOW (ref 6–20)
CALCIUM ION: 1.16 mmol/L (ref 1.15–1.40)
CHLORIDE: 98 mmol/L — AB (ref 101–111)
Creatinine, Ser: 0.7 mg/dL (ref 0.61–1.24)
GLUCOSE: 112 mg/dL — AB (ref 65–99)
HCT: 33 % — ABNORMAL LOW (ref 39.0–52.0)
Hemoglobin: 11.2 g/dL — ABNORMAL LOW (ref 13.0–17.0)
Potassium: 4.1 mmol/L (ref 3.5–5.1)
Sodium: 141 mmol/L (ref 135–145)
TCO2: 32 mmol/L (ref 0–100)

## 2016-03-13 LAB — VALPROIC ACID LEVEL: VALPROIC ACID LVL: 51 ug/mL (ref 50.0–100.0)

## 2016-03-13 MED ORDER — ZIPRASIDONE MESYLATE 20 MG IM SOLR
10.0000 mg | Freq: Once | INTRAMUSCULAR | Status: AC
  Administered 2016-03-13: 10 mg via INTRAMUSCULAR
  Filled 2016-03-13: qty 20

## 2016-03-13 MED ORDER — OXCARBAZEPINE 300 MG PO TABS
300.0000 mg | ORAL_TABLET | Freq: Two times a day (BID) | ORAL | Status: DC
  Administered 2016-03-13 – 2016-03-15 (×5): 300 mg via ORAL
  Filled 2016-03-13 (×6): qty 1

## 2016-03-13 MED ORDER — LORAZEPAM 2 MG/ML IJ SOLN: 1.0000 mg | Freq: Once | INTRAMUSCULAR | Status: DC

## 2016-03-13 MED ORDER — DIVALPROEX SODIUM ER 500 MG PO TB24: 500.0000 mg | ORAL_TABLET | Freq: Two times a day (BID) | ORAL | Status: DC

## 2016-03-13 MED ORDER — ZIPRASIDONE MESYLATE 20 MG IM SOLR
20.0000 mg | Freq: Two times a day (BID) | INTRAMUSCULAR | Status: DC | PRN
  Administered 2016-03-14: 20 mg via INTRAMUSCULAR
  Filled 2016-03-13: qty 20

## 2016-03-13 MED ORDER — LORAZEPAM 1 MG PO TABS
1.0000 mg | ORAL_TABLET | Freq: Three times a day (TID) | ORAL | Status: DC
  Administered 2016-03-13 – 2016-03-17 (×12): 1 mg via ORAL
  Filled 2016-03-13 (×13): qty 1

## 2016-03-13 MED ORDER — HYDROXYZINE HCL 50 MG/ML IM SOLN
100.0000 mg | Freq: Four times a day (QID) | INTRAMUSCULAR | Status: DC | PRN
  Filled 2016-03-13: qty 2

## 2016-03-13 MED ORDER — RISPERIDONE 2 MG PO TABS
2.0000 mg | ORAL_TABLET | Freq: Every day | ORAL | Status: DC
  Administered 2016-03-13: 2 mg via ORAL
  Filled 2016-03-13: qty 1

## 2016-03-13 MED ORDER — STERILE WATER FOR INJECTION IJ SOLN
INTRAMUSCULAR | Status: AC
  Filled 2016-03-13: qty 10

## 2016-03-13 MED ORDER — LORAZEPAM 2 MG/ML IJ SOLN
2.0000 mg | Freq: Once | INTRAMUSCULAR | Status: AC
Start: 2016-03-13 — End: 2016-03-13
  Administered 2016-03-13: 2 mg via INTRAMUSCULAR
  Filled 2016-03-13: qty 1

## 2016-03-13 MED ORDER — HYDROXYZINE HCL 50 MG/ML IM SOLN
100.0000 mg | Freq: Three times a day (TID) | INTRAMUSCULAR | Status: DC | PRN
  Administered 2016-03-13: 100 mg via INTRAMUSCULAR

## 2016-03-13 MED ORDER — STERILE WATER FOR INJECTION IJ SOLN
INTRAMUSCULAR | Status: AC
  Administered 2016-03-13: 2 mL
  Filled 2016-03-13: qty 10

## 2016-03-13 MED ORDER — DIPHENHYDRAMINE HCL 50 MG/ML IJ SOLN
25.0000 mg | Freq: Once | INTRAMUSCULAR | Status: AC
  Administered 2016-03-13: 25 mg via INTRAMUSCULAR
  Filled 2016-03-13: qty 1

## 2016-03-13 MED ORDER — DIVALPROEX SODIUM 500 MG PO DR TAB: 500.0000 mg | DELAYED_RELEASE_TABLET | Freq: Two times a day (BID) | ORAL | Status: DC

## 2016-03-13 MED ORDER — LORAZEPAM 2 MG/ML IJ SOLN
1.0000 mg | Freq: Four times a day (QID) | INTRAMUSCULAR | Status: DC | PRN
  Administered 2016-03-14: 1 mg via INTRAMUSCULAR
  Filled 2016-03-13: qty 1

## 2016-03-13 MED ORDER — STERILE WATER FOR INJECTION IJ SOLN
INTRAMUSCULAR | Status: AC
  Administered 2016-03-13: 22:00:00
  Filled 2016-03-13: qty 10

## 2016-03-13 NOTE — ED Notes (Signed)
Pt sleeping quietly at this time.  Restraints were removed at this time per EDP.

## 2016-03-13 NOTE — ED Notes (Signed)
Headboard and footboard removed from bed as patient is continually banging hands and feet against them and pushing feet against footboard, bending it.

## 2016-03-13 NOTE — ED Notes (Signed)
Pt still very aggressive and combative at this time---- tried to hit staff when attempted to get vital signs.

## 2016-03-13 NOTE — ED Notes (Addendum)
Pt unrestrained while bed linen changed, pt given urinal to urinate and breakfast given; pt calm and obeying commands at this time.

## 2016-03-13 NOTE — ED Notes (Signed)
Pt back in bed at this time--- aggression decreased with presence of the [employer] officer.
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
## 2016-03-13 NOTE — ED Notes (Signed)
Report given to RN

## 2016-03-13 NOTE — ED Notes (Signed)
Pt allowed to ambulate to bathroom, with assist of security, [employer], pt continues to yell, curse, and threaten staff. Pt continuously banging toilet seat. Assisted back to bed.

## 2016-03-13 NOTE — ED Notes (Signed)
Pt refused vital signs--- pt tried to hit staff with the attempt to get vital signs.

## 2016-03-13 NOTE — ED Provider Notes (Signed)
Patient continues to be agitated despite multiple medications, restraints and close supervision security, and caregivers. He is tearful, defensive, and confused. He has been seen by psychiatry, who was been adjusting his medications. Vital signs are stable.  Vitals:   03/13/16 1207 03/13/16 1500  BP: 139/85 112/68  Pulse: 74 (!) 54  Resp: 20 22  Temp: 98 F (36.7 C) 98.4 F (36.9 C)    ONE-time dose of Geodon 10 mg, and Vistaril 100 mg, both IM.  Medications adjusted, change Klonopin to Ativan 3 times a day, allow when necessary dosing of Ativan, Geodon, and Vistaril.  Psychiatry, to continue to evaluate patient and TTS to work on placement.   Mancel BaleElliott Trennon Torbeck, MD 03/13/16 [id number]

## 2016-03-13 NOTE — Progress Notes (Addendum)
Pt promised the nurse he would behave. Restraints removed . Pt was given dinner and has been conversing with the tech. Pt c/o a headache and was given 2 tylenol po. Pt walked to the BR with minimal assistance. (7:45pm) He was put into a cardiac chair and requested to watch the weather channel. Pt is cooperative for now .Sitter remains with the pt. 9:05pm _pt started to yell and scream stating ,"get that fucking on call doctor now. I want to be discharged tonight." Pt given all his pm meds. Placed back in bed with all 4 siderails up. Pt does have garbled speech but an be understood. He insisted that his group home be contacted too. Pt at times can be very calm and then explosive .Restriants remove off but informed pt he must behave. Pt stated, "I am sorry."9:35pm Pt came out of his room and stated, "where is my sitter? Get me out of here, Call the doctor now." Security was called and responded immediately. Pt given 10mg  of geodon. Security at the bedside during administration. (10pm)
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
## 2016-03-13 NOTE — ED Notes (Signed)
Pt kicking computer in room. Unable to redirect. Security and [employer] called to bedside. Dr. Effie ShyWentz notified for additional orders to manage extreme aggression.

## 2016-03-14 MED ORDER — OLANZAPINE 10 MG IM SOLR: 10.0000 mg | Freq: Once | INTRAMUSCULAR | Status: DC | PRN

## 2016-03-14 MED ORDER — LORAZEPAM 2 MG/ML IJ SOLN
2.0000 mg | Freq: Once | INTRAMUSCULAR | Status: AC
  Administered 2016-03-14: 2 mg via INTRAMUSCULAR
  Filled 2016-03-14: qty 1

## 2016-03-14 MED ORDER — STERILE WATER FOR INJECTION IJ SOLN
INTRAMUSCULAR | Status: AC
  Administered 2016-03-14: 10 mL
  Filled 2016-03-14: qty 10

## 2016-03-14 MED ORDER — DIPHENHYDRAMINE HCL 50 MG/ML IJ SOLN
50.0000 mg | Freq: Once | INTRAMUSCULAR | Status: DC
  Filled 2016-03-14 (×2): qty 1

## 2016-03-14 MED ORDER — ASENAPINE MALEATE 5 MG SL SUBL
5.0000 mg | SUBLINGUAL_TABLET | Freq: Two times a day (BID) | SUBLINGUAL | Status: DC
  Administered 2016-03-14 – 2016-03-15 (×3): 5 mg via SUBLINGUAL
  Filled 2016-03-14 (×3): qty 1

## 2016-03-14 MED ORDER — LORAZEPAM 2 MG/ML IJ SOLN
2.0000 mg | Freq: Once | INTRAMUSCULAR | Status: AC | PRN
  Administered 2016-03-14: 2 mg via INTRAMUSCULAR
  Filled 2016-03-14: qty 1

## 2016-03-14 MED ORDER — ZIPRASIDONE MESYLATE 20 MG IM SOLR
10.0000 mg | Freq: Once | INTRAMUSCULAR | Status: AC
  Administered 2016-03-14: 10 mg via INTRAMUSCULAR
  Filled 2016-03-14: qty 20

## 2016-03-14 MED ORDER — STERILE WATER FOR INJECTION IJ SOLN
INTRAMUSCULAR | Status: AC
  Filled 2016-03-14: qty 10

## 2016-03-14 NOTE — ED Notes (Signed)
Pt called Jeff Foster from phone. She stated she was his caregiver for 5 days. She states he can become violent and aggressive when he was in her care. She stated that he gets this way when he doesn't have much sleep.  Mr. Dillard CannonCook verbalizes, " I love you" to Jeff Foster prior to ending the phone call.

## 2016-03-14 NOTE — ED Notes (Signed)
Pt behaving in an agitated manner- getting up and walking through TCU. Security paged and pt redirected to room.
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
## 2016-03-14 NOTE — ED Notes (Addendum)
Pt given breakfast tray, right wrist released from restraints so pt can feed himself, immediately he threw the milk from tray. He is consistently cussing at staff, calling nurse and sitter bitches and whores. He obviously responds better to male Engineer, materialssecurity officer, than male nursing staff. Restraints will be reapplied once pt finishes breakfast.

## 2016-03-14 NOTE — ED Notes (Signed)
Pt is sliding off of bed, pulling his pants off and screaming at male nursing staff "Bitch suck my dick", and "Whores, you are whores." Pt is calling "Dianne, Dianne". Ativan and Geodon administered with assistance of security officers and off duty [employer]

## 2016-03-14 NOTE — ED Notes (Addendum)
Pt slightly agitated that he does not have his belongings: food stamp/ EBT card. Per Rosey Batheresa, he did not have belongings when he left. She states he can work toward getting another from social services if needed.   He does not have belongings in ED: locker or behind desk. Darl PikesSusan, Charge RN  Aware.

## 2016-03-14 NOTE — ED Notes (Signed)
Pt demanding that I provide him with a phone and asking me why he cannot be released. He stood up and advanced in a menacing manner yelling and screaming, threatening harm.

## 2016-03-14 NOTE — Progress Notes (Signed)
Re-Assessment:   Information was collected from Nursing staff and security.  This writer was about to walk toward the patient's room then witnessed the security in the room attempting to de-escalate.  According to the reports from nursing staff the patient was throwing objects in the room and verbally aggressive towards the staff.  It was reported that the patient was pulling his penis out instructing the nursing staff to "suck" on his genitals.  Patient was given IM to assist with de-escalation efforts.       Maryelizabeth Rowanressa Terryl Niziolek, MSW, Clare CharonLCSW, LCAS [facility] Triage Specialist [phone]

## 2016-03-14 NOTE — ED Provider Notes (Signed)
Patient has become extremely agitated and aggressive. He will be placed back in restraints and given his PRN sedation.  Vitals:   03/13/16 2207 03/14/16 0600  BP: 119/64 131/70  Pulse: 64 69  Resp: 18 18  Temp: 98.1 F (36.7 C) 98 F (36.7 C)      Gilda Creasehristopher J Pollina, MD 03/14/16 [phone]

## 2016-03-14 NOTE — ED Notes (Signed)
Pt became agitated with individual, he began to charge at the staff with the head board.  Security was called and patient placed back in restraints.

## 2016-03-15 DIAGNOSIS — R45851 Suicidal ideations: Secondary | ICD-10-CM | POA: Diagnosis not present

## 2016-03-15 DIAGNOSIS — F411 Generalized anxiety disorder: Secondary | ICD-10-CM | POA: Diagnosis not present

## 2016-03-15 DIAGNOSIS — F6381 Intermittent explosive disorder: Secondary | ICD-10-CM | POA: Diagnosis not present

## 2016-03-15 DIAGNOSIS — F6089 Other specific personality disorders: Secondary | ICD-10-CM | POA: Diagnosis not present

## 2016-03-15 MED ORDER — ASENAPINE MALEATE 5 MG SL SUBL
10.0000 mg | SUBLINGUAL_TABLET | Freq: Two times a day (BID) | SUBLINGUAL | Status: DC
  Administered 2016-03-15 – 2016-03-16 (×2): 10 mg via SUBLINGUAL
  Filled 2016-03-15 (×4): qty 2

## 2016-03-15 MED ORDER — OLANZAPINE 10 MG PO TBDP
10.0000 mg | ORAL_TABLET | Freq: Three times a day (TID) | ORAL | Status: DC | PRN
  Administered 2016-03-15 – 2016-03-16 (×4): 10 mg via ORAL
  Filled 2016-03-15 (×5): qty 1

## 2016-03-15 NOTE — Progress Notes (Signed)
CSW received call from RN in TCU inquiring at updates regarding patient. Patient is still able to return to his group home once aggression is controlled and stable for discharge.   Stacy GardnerErin Tyshana Nishida, LCSWA Clinical Social Worker [phone]

## 2016-03-15 NOTE — ED Provider Notes (Signed)
5:15 AM I was called by the nursing staff to reorder restraints. I have reviewed the patient's chart. Restraint order expired 18 hours ago. On my evaluation, patient is calm and cooperative. I discussed with him removing the restraints and giving him an opportunity to remain calm. He is in agreement. Discussed with the nursing staff.   Shon Batonourtney F Taytum Scheck, MD 03/15/16 [phone]
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
## 2016-03-15 NOTE — ED Notes (Signed)
Patient is agitated and hostile. [employer] and security is at bedside continually. Patient is attempting to elope and is verbal abusive and has a high risk of being violent with staff. Patient is forcefully moving bed, chair, and bedside table. Patient has to be reoriented frequently. The reorientation has little or no affect on behavior.
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
## 2016-03-15 NOTE — ED Notes (Signed)
Pt given sandwich, graham crackers and peanut butter and soda to drink. Notified the pt that he would not be able to get any more sandwiches during the night.

## 2016-03-15 NOTE — ED Notes (Addendum)
Pt chasing staff around in hallway with chair and banging on other patient's doors. Security at bedside. MD made aware.
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
## 2016-03-15 NOTE — ED Notes (Signed)
Pt stating he wants to go to the pool hall and comes out of the room into the hallway and through the double doors. Security called to bring the pt back to the room. Pt ambulated back to the room.

## 2016-03-15 NOTE — Progress Notes (Signed)
Patient verbally abusive with runs F... After every sentences, calling staffs bitch and asked the sitter to suck his dick, and came out of his rt wrist restraint 3 times.

## 2016-03-16 MED ORDER — OXCARBAZEPINE 300 MG PO TABS
600.0000 mg | ORAL_TABLET | Freq: Two times a day (BID) | ORAL | Status: DC
  Administered 2016-03-16 – 2016-03-17 (×3): 600 mg via ORAL
  Filled 2016-03-16 (×3): qty 2

## 2016-03-16 MED ORDER — CEFTRIAXONE SODIUM 1 G IJ SOLR
1.0000 g | Freq: Once | INTRAMUSCULAR | Status: AC
  Administered 2016-03-16: 1 g via INTRAMUSCULAR
  Filled 2016-03-16: qty 10

## 2016-03-16 MED ORDER — LIDOCAINE HCL 2 % IJ SOLN
INTRAMUSCULAR | Status: AC
  Administered 2016-03-16: 21:00:00
  Filled 2016-03-16: qty 20

## 2016-03-16 MED ORDER — TRAZODONE HCL 100 MG PO TABS
300.0000 mg | ORAL_TABLET | Freq: Every day | ORAL | Status: DC
  Administered 2016-03-16: 300 mg via ORAL
  Filled 2016-03-16: qty 3

## 2016-03-16 MED ORDER — HYDROXYZINE HCL 25 MG PO TABS
50.0000 mg | ORAL_TABLET | Freq: Two times a day (BID) | ORAL | Status: DC
  Administered 2016-03-16 – 2016-03-17 (×4): 50 mg via ORAL
  Filled 2016-03-16 (×3): qty 2

## 2016-03-16 MED ORDER — CHLORPROMAZINE HCL 25 MG PO TABS
50.0000 mg | ORAL_TABLET | Freq: Three times a day (TID) | ORAL | Status: DC
  Administered 2016-03-16 – 2016-03-17 (×2): 50 mg via ORAL
  Filled 2016-03-16 (×2): qty 2

## 2016-03-16 MED ORDER — DONEPEZIL HCL 5 MG PO TABS
5.0000 mg | ORAL_TABLET | Freq: Every day | ORAL | Status: DC
  Administered 2016-03-16: 5 mg via ORAL
  Filled 2016-03-16: qty 1
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
## 2016-03-16 NOTE — ED Notes (Signed)
Patient reporting that his buttocks hurt from hemorrhoids that he has had before.  When asked whether he had used any medication in the past, he said "the thing you stick up your ass."

## 2016-03-16 NOTE — ED Notes (Signed)
Patient playing cards with tech.  No aggressive behavior noted.  Cooperative and pleasant.
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
## 2016-03-16 NOTE — ED Notes (Signed)
Pt has required redirection multiple times to not yell at staff. Pt is responsive to redirection initially, but quickly returns to former behavior

## 2016-03-16 NOTE — ED Notes (Signed)
Two staff members from the group home are here to visit patient. They are therapeutic for the patient

## 2016-03-16 NOTE — ED Notes (Signed)
Jeff Foster at bedside talking to patient trying to get him to take his medications.  Patient did take his medications, but is quite agitated.

## 2016-03-16 NOTE — ED Notes (Signed)
The group home owner had multiple questions regarding changes in the pt's home medications. Catha NottinghamJamison was called and the owner of the group home spoke with the pt

## 2016-03-16 NOTE — BH Assessment (Signed)
[facility] Assessment Progress Note This Clinical research associatewriter spoke with patient this date to attempt to evaluate current mental status. Patient was difficult to understand and incoherent at times. Patient presented with a highly agitated affect and yelled at this writer to get out of his room. Patient continues to meet inpatient criteria as appropriate bed placement is investigated.

## 2016-03-16 NOTE — ED Provider Notes (Signed)
7:25 AM I was called to see the patient after aggressive behavior throughout the night. When I saw patient, he is sitting in his chair talking. He is intermittently angry but he has not shown any aggressive behavior. I do not think he needs IM medications or restraints at this time, continue to work on his oral medicines. Psychiatry should be seeing him soon. Initially patient is currently somewhat redirectable.   Pricilla LovelessScott Lajeana Strough, MD 03/16/16 [phone]

## 2016-03-16 NOTE — ED Notes (Signed)
Patient calmer now, eating his breakfast.

## 2016-03-16 NOTE — ED Notes (Signed)
Patient was able to rest in his chair asleep for approximately 45 minutes.

## 2016-03-16 NOTE — ED Notes (Signed)
Patient's behavior escalating, continues to scream at tech and nurses using profanity, asking our names, and being agitated.

## 2016-03-16 NOTE — ED Notes (Addendum)
Patient screaming again that he wants to see the doctor in charge that he signed himself in and wants to go home.  Security summoned because patient hitting his hand against the glass and going into the bathroom closing the door and hitting the walls and slamming doors.

## 2016-03-16 NOTE — ED Notes (Signed)
Patient continues to press nurse button and then when nurse comes to room he says he wants to leave, see his doctor, or that he is going to call his attorney.

## 2016-03-16 NOTE — ED Notes (Signed)
Patient

## 2016-03-16 NOTE — ED Notes (Addendum)
Patient alternates between crying and yelling saying he wants to go home.  Continues to slam door.  Requested a PRN and when asked why he said for his nerves.

## 2016-03-16 NOTE — Progress Notes (Signed)
Patients group home owner, Georgina PeerDerrick McDowell [phone] called CSW asking for an update on patient. CSW informed Ladene ArtistDerrick that patient is still aggressive and security has been with patient throughout the day. Georgina PeerDerrick McDowell will be coming to visit the patient at 9:00PM and is requesting to speak with his doctor.   Stacy GardnerErin Enis Leatherwood, LCSWA Clinical Social Worker [phone]

## 2016-03-16 NOTE — Progress Notes (Signed)
[name] visited with patient and was able to speak with Catha NottinghamJamison NP.   Stacy GardnerErin Shanara Schnieders, LCSWA Clinical Social Worker [phone]

## 2016-03-16 NOTE — ED Notes (Signed)
Coralie Keensldrika Davis (works at the group home) can be contacted at [phone]

## 2016-03-16 NOTE — ED Notes (Signed)
Patient getting upset again about wanting to use the cell phone to call his group home.

## 2016-03-16 NOTE — ED Notes (Signed)
Patient loud, slamming his door, yelling at the techs and nurse.  MD notified.  Ativan given 1mg  prn.  Security on standby.  Dr. Criss AlvineGoldston came in to evaluate patient.  No new orders given.

## 2016-03-16 NOTE — ED Notes (Signed)
Patient not able to rest.  Tried to lie down but now is back up beating on the walls and slamming his door.  Security on stand by.

## 2016-03-17 DIAGNOSIS — F6089 Other specific personality disorders: Secondary | ICD-10-CM | POA: Diagnosis not present

## 2016-03-17 DIAGNOSIS — F6381 Intermittent explosive disorder: Secondary | ICD-10-CM | POA: Diagnosis not present

## 2016-03-17 DIAGNOSIS — F411 Generalized anxiety disorder: Secondary | ICD-10-CM | POA: Diagnosis not present

## 2016-03-17 MED ORDER — LORAZEPAM 1 MG PO TABS: 1.0000 mg | ORAL_TABLET | Freq: Three times a day (TID) | ORAL | 0 refills | Status: DC

## 2016-03-17 MED ORDER — HYDROXYZINE HCL 25 MG PO TABS
50.0000 mg | ORAL_TABLET | Freq: Once | ORAL | Status: DC
  Filled 2016-03-17: qty 2

## 2016-03-17 MED ORDER — TRAZODONE HCL 300 MG PO TABS: 300.0000 mg | ORAL_TABLET | Freq: Every day | ORAL | 0 refills | Status: DC

## 2016-03-17 MED ORDER — CHLORPROMAZINE HCL 50 MG PO TABS: 50.0000 mg | ORAL_TABLET | Freq: Three times a day (TID) | ORAL | 0 refills | Status: AC

## 2016-03-17 MED ORDER — LORAZEPAM 1 MG PO TABS
2.0000 mg | ORAL_TABLET | Freq: Once | ORAL | Status: AC
  Administered 2016-03-17: 2 mg via ORAL
  Filled 2016-03-17: qty 2

## 2016-03-17 MED ORDER — OXCARBAZEPINE 600 MG PO TABS: 600.0000 mg | ORAL_TABLET | Freq: Two times a day (BID) | ORAL | 0 refills | Status: AC

## 2016-03-17 NOTE — ED Notes (Signed)
Patient is getting ready for discharge. His ride mrs davis has been called and she will be here around 1530 to pick him up

## 2016-03-17 NOTE — ED Notes (Signed)
Patient reporting that he cannot urinate. States that he has not urinated in the past 12 hours. MD notified.

## 2016-03-17 NOTE — BH Assessment (Signed)
[facility] Assessment Progress Note  Commitment change for patient sent to clerk of court at 1034 am.
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
## 2016-03-17 NOTE — Discharge Instructions (Signed)
Patient discharged back to residence.  Patient to follow up with provider

## 2016-03-17 NOTE — ED Notes (Signed)
Pt became increasingly agitated and was not redirectable. Pt became aggressive and threatening with staff. This RN called [facility] to get verbal orders for medications to help pt calm down from [employer] Simon [employer].

## 2016-03-17 NOTE — [facility] Suicide Risk Assessment (Signed)
Suicide Risk Assessment  Discharge Assessment   [facility]   Principal Problem: Intermittent explosive disorder Discharge Diagnoses:  Patient Active Problem List   Diagnosis Date Noted  . Anxiety, generalized [F41.1] 03/13/2016    Priority: High  . Intermittent explosive disorder [F63.81] 03/13/2016    Priority: High  . Urinary retention [R33.9] 03/10/2016  . Aggressive behavior [F60.89] 03/06/2016  . Essential hypertension [I10]   . Post-ictal confusion [F05] 10/27/2015  . Hypothyroidism [E03.9] 10/27/2015  . Thrombocytopenia (HCC) [D69.6] 10/27/2015  . Acute respiratory failure with hypoxia (HCC) [J96.01] 10/27/2015  . Seizure (HCC) [R56.9] 10/27/2015  . Psychosis [F29] 10/27/2015  . Normocytic anemia [D64.9] 10/27/2015  . DNR (do not resuscitate) [Z66] 07/25/2015  . Aspiration pneumonia (HCC) [J69.0] 07/25/2015  . Palliative care encounter [Z51.5] 07/25/2015  . Dysphagia [R13.10]   . Altered mental status [R41.82]   . UTI (lower urinary tract infection) [N39.0] 07/18/2015  . Cognitive decline [R41.89] 07/18/2015  . Acute encephalopathy [G93.40] 07/18/2015  . Mental retardation [F79] 07/18/2015  . Hypertension [I10] 07/18/2015  . History of CVA (cerebrovascular accident) [Z86.73] 07/18/2015  . Encephalopathy acute [G93.40] 07/18/2015  . Dehydration [E86.0]     Total Time spent with patient: 30 minutes  Musculoskeletal: Strength & Muscle Tone: within normal limits Gait & Station: normal Patient leans: N/A  Psychiatric Specialty Exam: Physical Exam  Constitutional: He is oriented to person, place, and time. He appears well-developed and well-nourished.  HENT:  Head: Normocephalic.  Neck: Normal range of motion.  Respiratory: Effort normal.  Musculoskeletal: Normal range of motion.  Neurological: He is alert and oriented to person, place, and time.  Skin: Skin is warm and dry.  Psychiatric: He has a normal mood and affect. His speech is  normal and behavior is normal. Thought content normal. Cognition and memory are normal. He expresses impulsivity.    Review of Systems  Constitutional: Negative.   HENT: Negative.   Eyes: Negative.   Respiratory: Negative.   Cardiovascular: Negative.   Gastrointestinal: Negative.   Genitourinary: Negative.   Musculoskeletal: Negative.   Skin: Negative.   Neurological: Negative.   Endo/Heme/Allergies: Negative.   Psychiatric/Behavioral: Negative.     Blood pressure 121/77, pulse 66, temperature 98.7 F (37.1 C), temperature source Oral, resp. rate 20, SpO2 98 %.There is no height or weight on file to calculate BMI.  General Appearance: Casual  Eye Contact:  Good  Speech:  Normal Rate  Volume:  Normal  Mood:  Euthymic  Affect:  Congruent  Thought Process:  Coherent and Descriptions of Associations: Intact  Orientation:  Full (Time, Place, and Person)  Thought Content:  WDL  Suicidal Thoughts:  No  Homicidal Thoughts:  No  Memory:  Immediate;   Good Recent;   Fair Remote;   Fair  Judgement:  Fair  Insight:  Fair  Psychomotor Activity:  Normal  Concentration:  Concentration: Good and Attention Span: Good  Recall:  [employer]  Fund of Knowledge:  Fair  Language:  Good  Akathisia:  No  Handed:  Right  AIMS (if indicated):     Assets:  Housing Leisure Time Physical Health Resilience  ADL's:  Intact  Cognition:  WNL  Sleep:       Mental Status Per Nursing Assessment::   On Admission:   agitation and aggression  Demographic Factors:  Male and Caucasian  Loss Factors: NA  Historical Factors: Impulsivity  Risk Reduction Factors:   Positive social support and Positive therapeutic relationship  Continued Clinical  Symptoms:  None  Cognitive Features That Contribute To Risk:  None    Suicide Risk:  Minimal: No identifiable suicidal ideation.  Patients presenting with no risk factors but with morbid ruminations; may be classified as minimal risk based on the  severity of the depressive symptoms    Plan Of Care/Follow-up recommendations:  Activity:  as tolerated  Diet:  as tolerated  Mylani Gentry, NP 03/17/2016, 11:36 AM

## 2016-03-18 ENCOUNTER — Encounter (HOSPITAL_COMMUNITY): Payer: Self-pay

## 2016-03-18 ENCOUNTER — Emergency Department (HOSPITAL_COMMUNITY)
Admission: EM | Admit: 2016-03-18 | Discharge: 2016-03-18 | Disposition: A | Discharge status: Home / Self Care | Payer: Medicare Other | Attending: Emergency Medicine | Admitting: Emergency Medicine

## 2016-03-18 DIAGNOSIS — Z79891 Long term (current) use of opiate analgesic: Secondary | ICD-10-CM | POA: Diagnosis not present

## 2016-03-18 DIAGNOSIS — F918 Other conduct disorders: Secondary | ICD-10-CM | POA: Diagnosis not present

## 2016-03-18 DIAGNOSIS — Z79899 Other long term (current) drug therapy: Secondary | ICD-10-CM | POA: Diagnosis not present

## 2016-03-18 DIAGNOSIS — F6381 Intermittent explosive disorder: Secondary | ICD-10-CM | POA: Diagnosis present

## 2016-03-18 DIAGNOSIS — Z8673 Personal history of transient ischemic attack (TIA), and cerebral infarction without residual deficits: Secondary | ICD-10-CM | POA: Insufficient documentation

## 2016-03-18 DIAGNOSIS — Z87891 Personal history of nicotine dependence: Secondary | ICD-10-CM | POA: Insufficient documentation

## 2016-03-18 DIAGNOSIS — E039 Hypothyroidism, unspecified: Secondary | ICD-10-CM | POA: Insufficient documentation

## 2016-03-18 DIAGNOSIS — R4689 Other symptoms and signs involving appearance and behavior: Secondary | ICD-10-CM

## 2016-03-18 DIAGNOSIS — I10 Essential (primary) hypertension: Secondary | ICD-10-CM | POA: Insufficient documentation

## 2016-03-18 DIAGNOSIS — Z7951 Long term (current) use of inhaled steroids: Secondary | ICD-10-CM | POA: Diagnosis not present

## 2016-03-18 MED ORDER — ONDANSETRON HCL 4 MG PO TABS: 4.0000 mg | ORAL_TABLET | Freq: Three times a day (TID) | ORAL | Status: DC | PRN

## 2016-03-18 MED ORDER — ACETAMINOPHEN 325 MG PO TABS: 650.0000 mg | ORAL_TABLET | ORAL | Status: DC | PRN

## 2016-03-18 MED ORDER — DIVALPROEX SODIUM ER 500 MG PO TB24: 1000.0000 mg | ORAL_TABLET | Freq: Every morning | ORAL | 0 refills | Status: DC

## 2016-03-18 MED ORDER — HYDROXYZINE HCL 25 MG PO TABS: 50.0000 mg | ORAL_TABLET | Freq: Two times a day (BID) | ORAL | Status: DC | PRN

## 2016-03-18 MED ORDER — TRAZODONE HCL 100 MG PO TABS: 300.0000 mg | ORAL_TABLET | Freq: Every day | ORAL | Status: DC

## 2016-03-18 MED ORDER — SERTRALINE HCL 50 MG PO TABS
100.0000 mg | ORAL_TABLET | Freq: Every day | ORAL | Status: DC
  Administered 2016-03-18: 100 mg via ORAL
  Filled 2016-03-18: qty 2

## 2016-03-18 MED ORDER — DIVALPROEX SODIUM ER 500 MG PO TB24
1000.0000 mg | ORAL_TABLET | Freq: Every morning | ORAL | Status: DC
  Administered 2016-03-18: 1000 mg via ORAL
  Filled 2016-03-18: qty 2

## 2016-03-18 MED ORDER — QUETIAPINE FUMARATE 100 MG PO TABS: 200.0000 mg | ORAL_TABLET | Freq: Every day | ORAL | Status: DC

## 2016-03-18 MED ORDER — QUETIAPINE FUMARATE 100 MG PO TABS: 100.0000 mg | ORAL_TABLET | Freq: Every day | ORAL | Status: DC

## 2016-03-18 MED ORDER — LORAZEPAM 1 MG PO TABS: 1.0000 mg | ORAL_TABLET | Freq: Two times a day (BID) | ORAL | Status: DC

## 2016-03-18 MED ORDER — LORAZEPAM 1 MG PO TABS
1.0000 mg | ORAL_TABLET | Freq: Three times a day (TID) | ORAL | Status: DC | PRN
  Administered 2016-03-18: 1 mg via ORAL
  Filled 2016-03-18: qty 1

## 2016-03-18 MED ORDER — PANTOPRAZOLE SODIUM 40 MG PO TBEC: 40.0000 mg | DELAYED_RELEASE_TABLET | Freq: Every day | ORAL | 0 refills | Status: AC

## 2016-03-18 MED ORDER — QUETIAPINE FUMARATE 200 MG PO TABS: 200.0000 mg | ORAL_TABLET | Freq: Every day | ORAL | 0 refills | Status: AC

## 2016-03-18 MED ORDER — RISPERIDONE 1 MG PO TABS: 1.0000 mg | ORAL_TABLET | Freq: Two times a day (BID) | ORAL | Status: DC

## 2016-03-18 MED ORDER — GABAPENTIN 300 MG PO CAPS: 300.0000 mg | ORAL_CAPSULE | Freq: Three times a day (TID) | ORAL | Status: DC

## 2016-03-18 MED ORDER — MIRTAZAPINE 30 MG PO TABS: 15.0000 mg | ORAL_TABLET | Freq: Every day | ORAL | Status: DC

## 2016-03-18 MED ORDER — OXCARBAZEPINE 150 MG PO TABS: 150.0000 mg | ORAL_TABLET | Freq: Two times a day (BID) | ORAL | Status: DC

## 2016-03-18 MED ORDER — STERILE WATER FOR INJECTION IJ SOLN
INTRAMUSCULAR | Status: AC
  Administered 2016-03-18: 10 mL
  Filled 2016-03-18: qty 10

## 2016-03-18 MED ORDER — HYDROXYZINE HCL 25 MG PO TABS: 25.0000 mg | ORAL_TABLET | Freq: Three times a day (TID) | ORAL | Status: DC

## 2016-03-18 MED ORDER — GABAPENTIN 300 MG PO CAPS: 300.0000 mg | ORAL_CAPSULE | Freq: Three times a day (TID) | ORAL | 0 refills | Status: AC

## 2016-03-18 MED ORDER — PANTOPRAZOLE SODIUM 40 MG PO TBEC
40.0000 mg | DELAYED_RELEASE_TABLET | Freq: Every day | ORAL | Status: DC
  Administered 2016-03-18: 40 mg via ORAL
  Filled 2016-03-18: qty 1

## 2016-03-18 MED ORDER — HYDROXYZINE HCL 25 MG PO TABS: 25.0000 mg | ORAL_TABLET | Freq: Three times a day (TID) | ORAL | 0 refills | Status: AC

## 2016-03-18 MED ORDER — LEVOTHYROXINE SODIUM 25 MCG PO TABS
25.0000 ug | ORAL_TABLET | Freq: Every day | ORAL | Status: DC
  Administered 2016-03-18: 25 ug via ORAL
  Filled 2016-03-18: qty 1

## 2016-03-18 MED ORDER — ZIPRASIDONE MESYLATE 20 MG IM SOLR
20.0000 mg | Freq: Once | INTRAMUSCULAR | Status: AC
  Administered 2016-03-18: 20 mg via INTRAMUSCULAR
  Filled 2016-03-18: qty 20

## 2016-03-18 MED ORDER — LEVOTHYROXINE SODIUM 25 MCG PO TABS: 25.0000 ug | ORAL_TABLET | Freq: Every day | ORAL | 0 refills | Status: AC

## 2016-03-18 MED ORDER — TAMSULOSIN HCL 0.4 MG PO CAPS: 0.4000 mg | ORAL_CAPSULE | Freq: Every day | ORAL | Status: DC

## 2016-03-18 MED ORDER — SERTRALINE HCL 100 MG PO TABS: 100.0000 mg | ORAL_TABLET | Freq: Every day | ORAL | 0 refills | Status: AC

## 2016-03-18 MED ORDER — LORAZEPAM 1 MG PO TABS: 1.0000 mg | ORAL_TABLET | Freq: Two times a day (BID) | ORAL | 0 refills | Status: AC

## 2016-03-18 MED ORDER — TAMSULOSIN HCL 0.4 MG PO CAPS: 0.4000 mg | ORAL_CAPSULE | Freq: Every day | ORAL | 0 refills | Status: AC

## 2016-03-18 MED ORDER — ZOLPIDEM TARTRATE 5 MG PO TABS: 5.0000 mg | ORAL_TABLET | Freq: Every evening | ORAL | Status: DC | PRN

## 2016-03-18 MED ORDER — CLONAZEPAM 0.5 MG PO TABS: 0.5000 mg | ORAL_TABLET | Freq: Three times a day (TID) | ORAL | Status: DC

## 2016-03-18 MED ORDER — MIRTAZAPINE 15 MG PO TABS: 15.0000 mg | ORAL_TABLET | Freq: Every day | ORAL | 0 refills | Status: AC

## 2016-03-18 MED ORDER — TRAZODONE HCL 300 MG PO TABS: 300.0000 mg | ORAL_TABLET | Freq: Every day | ORAL | 0 refills | Status: AC
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
## 2016-03-18 NOTE — ED Notes (Signed)
Pt waiting to talk to "his social worker on call"

## 2016-03-18 NOTE — ED Notes (Signed)
TTS at bedside. 
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
## 2016-03-18 NOTE — [facility] Counselor (Signed)
Per Dr Jannifer FranklinAkintayo, pt doesn't meet criteria for involuntary commitment. Akintayo signed IVC rescind form. Writer placed original in [facility] IVC notebook and copy in pt's chart.    Evette Cristalaroline Paige Ople Girgis, [place] Therapeutic Triage Specialist

## 2016-03-18 NOTE — ED Notes (Signed)
[facility]

## 2016-03-18 NOTE — ED Provider Notes (Addendum)
[facility]-EMERGENCY DEPT Provider Note   CSN: [id number] Arrival date & time: 03/18/16  0340  By signing my name below, I, Octavia HeirArianna Nassar, attest that this documentation has been prepared under the direction and in the presence of Derwood KaplanAnkit Novalynn Branaman, MD.  Electronically Signed: Octavia HeirArianna Nassar, ED Scribe. 03/18/16. 4:10 AM.    History   Chief Complaint Chief Complaint  Patient presents with  . Aggressive Behavior    The history is provided by the patient. No language interpreter was used.   HPI Comments: Jeff Foster is a 55 y.o. male who has a PMhx of MR, paranoid schizophrenia, psychosis, seizure presents to the Emergency Department by [employer] presenting for IVC papers. Pt says his caretaker was getting nasty with him and calling him names which made him angry. Per police, caregiver called saying the pt was being very aggressive. He was going around the apartment complex where they live and was banging on the neighbors doors with his walker. [employer] says that the caretaker locked herself inside of the bedroom to try and get away from the pt. [employer] notes that this is not the first time they have been called out for these kind of actions fromt he pt. Pt was brought to the ED on 09/06 for same symptoms. Pt deals better with males via [employer]. Caretaker is taking out IVC papers on pt. There are no other complaints.  Past Medical History:  Diagnosis Date  . Intermittent explosive disorder   . MR (mental retardation)    Moderate Intellectual Developmental Disability  . Paranoid schizophrenia (HCC)   . Psychosis   . Seizure (HCC)   . Stroke (cerebrum) [facility](HCC)     Patient Active Problem List   Diagnosis Date Noted  . Anxiety, generalized 03/13/2016  . Intermittent explosive disorder 03/13/2016  . Urinary retention 03/10/2016  . Aggressive behavior 03/06/2016  . Essential hypertension   . Post-ictal confusion 10/27/2015  . Hypothyroidism 10/27/2015  . Thrombocytopenia (HCC) 10/27/2015  . Acute respiratory  failure with hypoxia (HCC) 10/27/2015  . Seizure (HCC) 10/27/2015  . Psychosis 10/27/2015  . Normocytic anemia 10/27/2015  . DNR (do not resuscitate) 07/25/2015  . Aspiration pneumonia (HCC) 07/25/2015  . Palliative care encounter 07/25/2015  . Dysphagia   . Altered mental status   . UTI (lower urinary tract infection) 07/18/2015  . Cognitive decline 07/18/2015  . Acute encephalopathy 07/18/2015  . Mental retardation 07/18/2015  . Hypertension 07/18/2015  . History of CVA (cerebrovascular accident) 07/18/2015  . Encephalopathy acute 07/18/2015  . Dehydration     History reviewed. No pertinent surgical history.     Home Medications    Prior to Admission medications   Medication Sig Start Date End Date Taking? Authorizing Provider  acetaminophen (TYLENOL) 500 MG tablet Take 500 mg by mouth every 6 (six) hours as needed for mild pain, moderate pain, fever or headache.   Yes Historical Provider, MD  albuterol (PROVENTIL HFA;VENTOLIN HFA) 108 (90 Base) MCG/ACT inhaler Inhale 2 puffs into the lungs every 6 (six) hours as needed for wheezing or shortness of breath.   Yes Historical Provider, MD  atorvastatin (LIPITOR) 40 MG tablet Take 40 mg by mouth at bedtime.   Yes Historical Provider, MD  chlorproMAZINE (THORAZINE) 50 MG tablet Take 1 tablet (50 mg total) by mouth 3 (three) times daily. 03/17/16  Yes Charm RingsJamison Y Lord, NP  clonazePAM (KLONOPIN) 0.5 MG tablet Take 1 tablet (0.5 mg total) by mouth 2 (two) times daily. Patient taking differently: Take 0.5 mg by mouth  3 (three) times daily.  08/04/15  Yes Meredeth Ide, MD  divalproex (DEPAKOTE ER) 500 MG 24 hr tablet Take 1 tablet (500 mg total) by mouth every morning. Patient taking differently: Take 1,000 mg by mouth every morning.  10/28/15  Yes Drema Dallas, MD  hydrOXYzine (VISTARIL) 50 MG capsule Take 50 mg by mouth 2 (two) times daily as needed for anxiety.   Yes Historical Provider, MD  levothyroxine (SYNTHROID, LEVOTHROID) 25 MCG  tablet Take 25 mcg by mouth daily before breakfast.   Yes Historical Provider, MD  LORazepam (ATIVAN) 1 MG tablet Take 1 tablet (1 mg total) by mouth 3 (three) times daily. 03/17/16  Yes Charm Rings, NP  mirtazapine (REMERON) 15 MG tablet Take 15 mg by mouth at bedtime.   Yes Historical Provider, MD  omeprazole (PRILOSEC) 20 MG capsule Take 20 mg by mouth daily.   Yes Historical Provider, MD  OXcarbazepine (TRILEPTAL) 150 MG tablet Take 150 mg by mouth 2 (two) times daily.   Yes Historical Provider, MD  Oxcarbazepine (TRILEPTAL) 600 MG tablet Take 1 tablet (600 mg total) by mouth 2 (two) times daily. 03/17/16  Yes Charm Rings, NP  QUEtiapine (SEROQUEL) 100 MG tablet Take 100 mg by mouth at bedtime.   Yes Historical Provider, MD  risperiDONE (RISPERDAL) 1 MG tablet Take 1 tablet (1 mg total) by mouth 2 (two) times daily. 08/04/15  Yes Meredeth Ide, MD  sertraline (ZOLOFT) 100 MG tablet Take 1 tablet (100 mg total) by mouth daily. 03/06/16  Yes Shuvon B Rankin, NP  tamsulosin (FLOMAX) 0.4 MG CAPS capsule Take 0.4 mg by mouth at bedtime.   Yes Historical Provider, MD  traZODone (DESYREL) 300 MG tablet Take 1 tablet (300 mg total) by mouth at bedtime. 03/17/16  Yes Charm Rings, NP    Family History History reviewed. No pertinent family history.  Social History Social History  Substance Use Topics  . Smoking status: Former Games developer  . Smokeless tobacco: Never Used  . Alcohol use Not on file     Allergies   Other and Peanut-containing drug products   Review of Systems Review of Systems A complete 10 system review of systems was obtained and all systems are negative except as noted in the HPI and PMH.    Physical Exam Updated Vital Signs BP 117/70 (BP Location: Left Arm)   Pulse 80   Temp 98.7 F (37.1 C) (Oral)   Resp 22   SpO2 98%   Physical Exam  Constitutional: He is oriented to person, place, and time. He appears well-developed and well-nourished.  HENT:  Head:  Normocephalic and atraumatic.  Eyes: EOM are normal. Pupils are equal, round, and reactive to light.  Neck: Normal range of motion.  Pulmonary/Chest: Effort normal.  Musculoskeletal: Normal range of motion.  Neurological: He is alert and oriented to person, place, and time.  Skin: No rash noted.  Nursing note and vitals reviewed.    ED Treatments / Results  DIAGNOSTIC STUDIES: Oxygen Saturation is 98% on RA, normal by my interpretation.  COORDINATION OF CARE:  3:48 AM Discussed treatment plan with pt at bedside and pt agreed to plan.  Labs (all labs ordered are listed, but only abnormal results are displayed) Labs Reviewed - No data to display  EKG  EKG Interpretation None       Radiology No results found.  Procedures Procedures (including critical care time)  Medications Ordered in ED Medications  LORazepam (ATIVAN) tablet 1  mg (not administered)  zolpidem (AMBIEN) tablet 5 mg (not administered)  ondansetron (ZOFRAN) tablet 4 mg (not administered)  acetaminophen (TYLENOL) tablet 650 mg (not administered)  clonazePAM (KLONOPIN) tablet 0.5 mg (not administered)  divalproex (DEPAKOTE ER) 24 hr tablet 1,000 mg (not administered)  hydrOXYzine (VISTARIL) capsule 50 mg (not administered)  levothyroxine (SYNTHROID, LEVOTHROID) tablet 25 mcg (not administered)  mirtazapine (REMERON) tablet 15 mg (not administered)  pantoprazole (PROTONIX) EC tablet 40 mg (not administered)  OXcarbazepine (TRILEPTAL) tablet 150 mg (not administered)  QUEtiapine (SEROQUEL) tablet 100 mg (not administered)  risperiDONE (RISPERDAL) tablet 1 mg (not administered)  sertraline (ZOLOFT) tablet 100 mg (not administered)  tamsulosin (FLOMAX) capsule 0.4 mg (not administered)  traZODone (DESYREL) tablet 300 mg (not administered)     Initial Impression / Assessment and Plan / ED Course  I have reviewed the triage vital signs and the nursing notes.  Pertinent labs & imaging results that were  available during my care of the patient were reviewed by me and considered in my medical decision making (see chart for details).  Clinical Course    Pt with schizophrenia and mental retardation comes in with cc of aggressive behavior. Pt was just discharged yday, and within 12 hours is back in the ER.  He is medically cleared for psych assessment. IVC paperwork will be filled out by me. He is medically cleared.  Final Clinical Impressions(s) / ED Diagnoses   Final diagnoses:  Aggressive behavior    New Prescriptions New Prescriptions   No medications on file     Derwood Kaplan, MD 03/18/16 4540    Derwood Kaplan, MD 03/18/16 [phone]

## 2016-03-18 NOTE — ED Notes (Addendum)
Called CSW about discharging pt. CSW waiting for facility to call back for someone to pick pt up

## 2016-03-18 NOTE — BH Assessment (Signed)
Assessment completed.  Consulted with Donell SievertSpencer Simon, PA-C who recommends patient be evaluated by psychiatry in the morning.   Davina PokeJoVea Kayse Puccini, LCSW Therapeutic Triage Specialist [facility] 03/18/2016 7:04 AM

## 2016-03-18 NOTE — ED Notes (Signed)
Pt verbalized understanding of d/c instructions and has no further questions. Pt is stable, A&Ox4, VSS.  

## 2016-03-18 NOTE — Progress Notes (Signed)
CSW informed patient has been psychiatrically cleared and ready for discharge. Spoke with [name] with [facility] to inform him of the discharge. ED CM spoke with Mr. Diona BrownerMcDowell to provide update on patient's medication changes. He stated he would see who would be able to pick up patient and call back. NP and Nurse updated.   Elenore PaddyLaVonia Margerite Impastato, LCSWA [phone] ED CSW 03/18/2016 11:58 AM

## 2016-03-18 NOTE — ED Notes (Signed)
Psychiatry at bedside.

## 2016-03-18 NOTE — ED Notes (Signed)
BH doctor visited patient and stated he will discharge him shortly.

## 2016-03-18 NOTE — BH Assessment (Addendum)
Assessment Note  Jeff Foster is an 55 y.o. male presenting to [facility]-ED under IVC.  IVC states:  -Respondent was banging his fists together and attempting to leave the care of the caretaker -He went to neighbors home banging on their doors and using his walker as a weapon -He on unknown types of meds   Patient states that after his discharge today the caregiver was "mean and she called me names." Patient states "she called me all types of MF's so I got mad." Patient states "can I just spend the night here tonight? Then I'll go back." Patient denies SI and history f attempts. Patient denies HI patients speech was mumbled throughout the remainder of the assessment and continued to ask this writer if he could "just spend the night" Patient was pleasant and cooperative and answered questions about SI "no ma'am." Patient asked for a blanket and requested this writer turn the light off.    Attempted to contact patients Legal guardian, [name] [phone], and there was no answer. Left message with request to return call to CSW cell phone.    Diagnosis: Intermittent Explosive Disorder   Past Medical History:  Past Medical History:  Diagnosis Date  . Intermittent explosive disorder   . MR (mental retardation)    Moderate Intellectual Developmental Disability  . Paranoid schizophrenia (HCC)   . Psychosis   . Seizure (HCC)   . Stroke (cerebrum) [facility])     History reviewed. No pertinent surgical history.  Family History: History reviewed. No pertinent family history.  Social History:  reports that he has quit smoking. He has never used smokeless tobacco. His alcohol and drug histories are not on file.  Additional Social History:  Alcohol / Drug Use Pain Medications: Denies Prescriptions: Denies Over the Counter: Denies History of alcohol / drug use?: No history of alcohol / drug abuse  CIWA: CIWA-Ar BP: 117/70 Pulse Rate: 80 COWS:    Allergies:  Allergies  Allergen Reactions  .  Other Other (See Comments)    clorox bleach- unknown reaction  . Peanut-Containing Drug Products Diarrhea    Home Medications:  (Not in a hospital admission)  OB/GYN Status:  No LMP for male patient.  General Assessment Data Location of Assessment: [facility] ED TTS Assessment: In system Is this a Tele or Face-to-Face Assessment?: Face-to-Face Is this an Initial Assessment or a Re-assessment for this encounter?: Initial Assessment Marital status: Single Is patient pregnant?: No Pregnancy Status: No Living Arrangements: Group Home Can pt return to current living arrangement?: Yes Admission Status: Involuntary Is patient capable of signing voluntary admission?: No (IVC) Referral Source: Other (IVC)     Crisis Care Plan Living Arrangements: Group Home Name of Psychiatrist: UTA Name of Therapist: UTA  Education Status Is patient currently in school?: No  Risk to self with the past 6 months Suicidal Ideation: No Has patient been a risk to self within the past 6 months prior to admission? : No Suicidal Intent: No Has patient had any suicidal intent within the past 6 months prior to admission? : No Is patient at risk for suicide?: No Suicidal Plan?: No Has patient had any suicidal plan within the past 6 months prior to admission? : No Specify Current Suicidal Plan: Denies Access to Means: No Previous Attempts/Gestures: No How many times?: 0 Other Self Harm Risks: Denies Triggers for Past Attempts: None known Intentional Self Injurious Behavior: None Family Suicide History:  (UTA) Recent stressful life event(s):  (UTA) Persecutory voices/beliefs?:  Rich Reining) Depression:  (  UTA) Depression Symptoms:  (UTA) Substance abuse history and/or treatment for substance abuse?: No Suicide prevention information given to non-admitted patients: Not applicable  Risk to Others within the past 6 months Homicidal Ideation: No Does patient have any lifetime risk of violence toward others beyond  the six months prior to admission? :  (UTA) Thoughts of Harm to Others:  (UTA) Comment - Thoughts of Harm to Others:  (UTA) Current Homicidal Intent:  (UTA) Current Homicidal Plan:  (UTA) Access to Homicidal Means:  (UTA) Describe Access to Homicidal Means:  (UTA) History of harm to others?:  (UTA) Assessment of Violence:  (UTA) Violent Behavior Description:  (UTA) Does patient have access to weapons?:  (UTA) Criminal Charges Pending?:  (UTA) Does patient have a court date:  (UTA) Is patient on probation?:  (UTA)  Psychosis Hallucinations:  (UTA) Delusions:  (UTA)  Mental Status Report Appearance/Hygiene: In hospital gown Eye Contact: Good Motor Activity: Unable to assess Speech: Unable to assess Level of Consciousness: Alert Mood: Pleasant Affect: Appropriate to circumstance Anxiety Level: None Thought Processes: Coherent, Relevant Orientation: Person, Place, Time, Situation, Appropriate for developmental age Obsessive Compulsive Thoughts/Behaviors: None  Cognitive Functioning Concentration: Unable to Assess Memory: Unable to Assess Insight: Unable to Assess Impulse Control: Unable to Assess Appetite:  (UTA) Sleep: Unable to Assess Total Hours of Sleep:  (UTA) Vegetative Symptoms: Unable to Assess  ADLScreening [facility] Services) Patient's cognitive ability adequate to safely complete daily activities?: Yes  Prior Inpatient Therapy Prior Inpatient Therapy: No Prior Therapy Dates: UTA Prior Therapy Facilty/Provider(s): UTA Reason for Treatment: UTA  Prior Outpatient Therapy Prior Outpatient Therapy: No Prior Therapy Dates: UTA Prior Therapy Facilty/Provider(s): UTA Reason for Treatment: UTA Does patient have an ACCT team?: No Does patient have Intensive In-House Services?  : No Does patient have [facility] services? : No Does patient have P4CC services?: No  ADL Screening (condition at time of admission) Patient's cognitive ability adequate to safely  complete daily activities?: Yes  Home Assistive Devices/Equipment Home Assistive Devices/Equipment:  (UTA)    Abuse/Neglect Assessment (Assessment to be complete while patient is alone) Physical Abuse:  (UTA) Verbal Abuse:  (UTA) Sexual Abuse:  (UTA) Exploitation of patient/patient's resources:  (UTA) Self-Neglect: Denies Values / Beliefs Cultural Requests During Hospitalization: None Spiritual Requests During Hospitalization: None Consults Spiritual Care Consult Needed: No Social Work Consult Needed: No Merchant navy officerAdvance Directives (For Healthcare) Does patient have an advance directive?:  (UTA) Would patient like information on creating an advanced directive?:  (UTA)    Additional Information 1:1 In Past 12 Months?: No CIRT Risk: Yes Elopement Risk: No Does patient have medical clearance?: Yes     Disposition:  Disposition Initial Assessment Completed for this Encounter: Yes Disposition of Patient: Other dispositions (AM psychiatric evaluation per Donell SievertSpencer Simon, PA-C) Other disposition(s): Other (Comment)  On Site Evaluation by:   Reviewed with Physician:    Dara Camargo 03/18/2016 7:16 AM

## 2016-03-18 NOTE — ED Provider Notes (Signed)
Blood pressure 123/62, pulse 72, temperature 98.5 F (36.9 C), temperature source Oral, resp. rate 18, SpO2 100 %.  Assuming care from Dr. Rhunette CroftNanavati.  In short, Jeff Foster is a 55 y.o. male with a chief complaint of Aggressive Behavior .  Refer to the original H&P for additional details.  The current plan of care is to follow along with TTS and reassess as needed. Patient is medically clear for evaluation.  09:41 AM Patient is becoming more and more verbally aggressive with staff. He threw pillows at a nurse. Seems as if he may be escalating. Will give IM Geodon and reassess. I have billed critical care time given need for sedation, frequent reassessment, and potential for sudden clinical deterioration.   Patient calming slightly after IM sedation. Pending TTS evaluation and disposition.   CRITICAL CARE Performed by: Maia PlanJoshua G Trenton Verne Total critical care time: 35 minutes Critical care time was exclusive of separately billable procedures and treating other patients. Critical care was necessary to treat or prevent imminent or life-threatening deterioration. Critical care was time spent personally by me on the following activities: development of treatment plan with patient and/or surrogate as well as nursing, discussions with consultants, evaluation of patient's response to treatment, examination of patient, obtaining history from patient or surrogate, ordering and performing treatments and interventions, ordering and review of laboratory studies, ordering and review of radiographic studies, pulse oximetry and re-evaluation of patient's condition.  Alona BeneJoshua Enis Riecke, MD Emergency Medicine    Maia PlanJoshua G Auren Valdes, MD 03/18/16 [phone]

## 2016-03-18 NOTE — Consult Note (Signed)
[facility]   Reason for Consult:  Agitation and aggression Referring Physician:  EDP Patient Identification: Jeff ChurnJohn Foster MRN:  [id number] Principal Diagnosis: Intermittent explosive disorder Diagnosis:   Patient Active Problem List   Diagnosis Date Noted  . Anxiety, generalized [F41.1] 03/13/2016  . Intermittent explosive disorder [F63.81] 03/13/2016  . Urinary retention [R33.9] 03/10/2016  . Aggressive behavior [F60.89] 03/06/2016  . Essential hypertension [I10]   . Post-ictal confusion [F05] 10/27/2015  . Hypothyroidism [E03.9] 10/27/2015  . Thrombocytopenia (HCC) [D69.6] 10/27/2015  . Acute respiratory failure with hypoxia (HCC) [J96.01] 10/27/2015  . Seizure (HCC) [R56.9] 10/27/2015  . Psychosis [F29] 10/27/2015  . Normocytic anemia [D64.9] 10/27/2015  . DNR (do not resuscitate) [Z66] 07/25/2015  . Aspiration pneumonia (HCC) [J69.0] 07/25/2015  . Palliative care encounter [Z51.5] 07/25/2015  . Dysphagia [R13.10]   . Altered mental status [R41.82]   . UTI (lower urinary tract infection) [N39.0] 07/18/2015  . Cognitive decline [R41.89] 07/18/2015  . Acute encephalopathy [G93.40] 07/18/2015  . Mental retardation [F79] 07/18/2015  . Hypertension [I10] 07/18/2015  . History of CVA (cerebrovascular accident) [Z86.73] 07/18/2015  . Encephalopathy acute [G93.40] 07/18/2015  . Dehydration [E86.0]     Total Time spent with patient: 30 minutes  Subjective:   Jeff ChurnJohn Foster is a 55 y.o. male patient does not warrant admission.  HPI:  55 yo male who was brought to the ED for agitation and increase in aggression.  His medications were adjusted and he has calmed down, cooperative and pleasant at this time.  Rocephin also given for UTI last night.  No suicidal/homicidal ideations, hallucinations, and alcohol/drug abuse.  Past Psychiatric History: alcohol dependence  Risk to Self: Suicidal Ideation: No Suicidal Intent: No Is patient at risk for suicide?:  No Suicidal Plan?: No Specify Current Suicidal Plan: Denies Access to Means: No How many times?: 0 Other Self Harm Risks: Denies Triggers for Past Attempts: None known Intentional Self Injurious Behavior: None Risk to Others: Homicidal Ideation: No Thoughts of Harm to Others:  (UTA) Comment - Thoughts of Harm to Others:  (UTA) Current Homicidal Intent:  (UTA) Current Homicidal Plan:  (UTA) Access to Homicidal Means:  (UTA) Describe Access to Homicidal Means:  (UTA) History of harm to others?:  (UTA) Assessment of Violence:  (UTA) Violent Behavior Description:  (UTA) Does patient have access to weapons?:  (UTA) Criminal Charges Pending?:  (UTA) 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  : No Does patient have [facility] services? : No Does patient have P4CC services?: No  Past Medical History:  Past Medical History:  Diagnosis Date  . Intermittent explosive disorder   . MR (mental retardation)    Moderate Intellectual Developmental Disability  . Paranoid schizophrenia (HCC)   . Psychosis   . Seizure (HCC)   . Stroke (cerebrum) [facility](HCC)    History reviewed. No pertinent surgical history. Family History: History reviewed. No pertinent family history. Family Psychiatric  History: none Social History:  History  Alcohol use Not on file     History  Drug use: Unknown    Social History   Social History  . Marital status: Unknown    Spouse name: N/A  . Number of children: N/A  .  Years of education: N/A   Social History Main Topics  . Smoking status: Former Games developer  . Smokeless tobacco: Never Used  . Alcohol  use None  . Drug use: Unknown  . Sexual activity: Not Asked   Other Topics Concern  . None   Social History Narrative  . None   Additional Social History:    Allergies:   Allergies  Allergen Reactions  . Other Other (See Comments)    clorox bleach- unknown reaction  . Peanut-Containing Drug Products Diarrhea    Labs: No results found for this or any previous visit (from the past 48 hour(s)).  No current facility-administered medications for this encounter.    Current Outpatient Prescriptions  Medication Sig Dispense Refill  . acetaminophen (TYLENOL) 500 MG tablet Take 500 mg by mouth every 6 (six) hours as needed for mild pain, moderate pain, fever or headache.    . albuterol (PROVENTIL HFA;VENTOLIN HFA) 108 (90 Base) MCG/ACT inhaler Inhale 2 puffs into the lungs every 6 (six) hours as needed for wheezing or shortness of breath.    Marland Kitchen atorvastatin (LIPITOR) 40 MG tablet Take 40 mg by mouth at bedtime.    . chlorproMAZINE (THORAZINE) 50 MG tablet Take 1 tablet (50 mg total) by mouth 3 (three) times daily. 90 tablet 0  . clonazePAM (KLONOPIN) 0.5 MG tablet Take 1 tablet (0.5 mg total) by mouth 2 (two) times daily. (Patient taking differently: Take 0.5 mg by mouth 3 (three) times daily. ) 30 tablet 0  . divalproex (DEPAKOTE ER) 500 MG 24 hr tablet Take 1 tablet (500 mg total) by mouth every morning. (Patient taking differently: Take 1,000 mg by mouth every morning. ) 30 tablet 0  . hydrOXYzine (VISTARIL) 50 MG capsule Take 50 mg by mouth 2 (two) times daily as needed for anxiety.    Marland Kitchen levothyroxine (SYNTHROID, LEVOTHROID) 25 MCG tablet Take 25 mcg by mouth daily before breakfast.    . LORazepam (ATIVAN) 1 MG tablet Take 1 tablet (1 mg total) by mouth 3 (three) times daily. 30 tablet 0  . mirtazapine (REMERON) 15 MG tablet Take 15 mg by mouth at bedtime.    Marland Kitchen omeprazole (PRILOSEC) 20 MG capsule Take 20 mg by mouth daily.    . OXcarbazepine (TRILEPTAL) 150 MG tablet Take 150 mg by  mouth 2 (two) times daily.    . Oxcarbazepine (TRILEPTAL) 600 MG tablet Take 1 tablet (600 mg total) by mouth 2 (two) times daily. 60 tablet 0  . QUEtiapine (SEROQUEL) 100 MG tablet Take 100 mg by mouth at bedtime.    . risperiDONE (RISPERDAL) 1 MG tablet Take 1 tablet (1 mg total) by mouth 2 (two) times daily. 30 tablet 2  . sertraline (ZOLOFT) 100 MG tablet Take 1 tablet (100 mg total) by mouth daily. 30 tablet 0  . tamsulosin (FLOMAX) 0.4 MG CAPS capsule Take 0.4 mg by mouth at bedtime.    . traZODone (DESYREL) 300 MG tablet Take 1 tablet (300 mg total) by mouth at bedtime. 30 tablet 0  . divalproex (DEPAKOTE ER) 500 MG 24 hr tablet Take 2 tablets (1,000 mg total) by mouth every morning. 60 tablet 0  . gabapentin (NEURONTIN) 300 MG capsule Take 1 capsule (300 mg total) by mouth 3 (three) times daily. 90 capsule 0  . hydrOXYzine (ATARAX/VISTARIL) 25 MG tablet Take 1 tablet (25 mg total) by mouth 3 (three) times daily. 90 tablet 0  . [START ON 03/19/2016] levothyroxine (SYNTHROID, LEVOTHROID) 25 MCG tablet Take 1  tablet (25 mcg total) by mouth daily before breakfast. 30 tablet 0  . LORazepam (ATIVAN) 1 MG tablet Take 1 tablet (1 mg total) by mouth 2 (two) times daily. 60 tablet 0  . mirtazapine (REMERON) 15 MG tablet Take 1 tablet (15 mg total) by mouth at bedtime. 30 tablet 0  . pantoprazole (PROTONIX) 40 MG tablet Take 1 tablet (40 mg total) by mouth daily. 30 tablet 0  . QUEtiapine (SEROQUEL) 200 MG tablet Take 1 tablet (200 mg total) by mouth at bedtime. 30 tablet 0  . sertraline (ZOLOFT) 100 MG tablet Take 1 tablet (100 mg total) by mouth daily. 30 tablet 0  . tamsulosin (FLOMAX) 0.4 MG CAPS capsule Take 1 capsule (0.4 mg total) by mouth at bedtime. 30 capsule 0  . traZODone (DESYREL) 300 MG tablet Take 1 tablet (300 mg total) by mouth at bedtime. 30 tablet 0    Musculoskeletal: Strength & Muscle Tone: within normal limits Gait & Station: normal Patient leans: N/A  Psychiatric Specialty  Exam: Physical Exam  Nursing note and vitals reviewed. Constitutional: He is oriented to person, place, and time. He appears well-developed and well-nourished.  HENT:  Head: Normocephalic.  Neck: Normal range of motion.  Respiratory: Effort normal.  Musculoskeletal: Normal range of motion.  Neurological: He is alert and oriented to person, place, and time.  Skin: Skin is warm and dry.  Psychiatric: He has a normal mood and affect. His speech is normal and behavior is normal. Thought content normal. Cognition and memory are normal. He expresses impulsivity.    Review of Systems  Constitutional: Negative.   HENT: Negative.   Eyes: Negative.   Respiratory: Negative.   Cardiovascular: Negative.   Gastrointestinal: Negative.   Genitourinary: Negative.   Musculoskeletal: Negative.   Skin: Negative.   Neurological: Negative.   Endo/Heme/Allergies: Negative.   Psychiatric/Behavioral: Negative.     Blood pressure (!) 160/115, pulse 66, temperature 98.5 F (36.9 C), temperature source Oral, resp. rate 18, SpO2 99 %.There is no height or weight on file to calculate BMI.  General Appearance: Casual  Eye Contact:  Good  Speech:  Normal Rate  Volume:  Normal  Mood:  Euthymic  Affect:  Congruent  Thought Process:  Coherent and Descriptions of Associations: Intact  Orientation:  Full (Time, Place, and Person)  Thought Content:  WDL  Suicidal Thoughts:  No  Homicidal Thoughts:  No  Memory:  Immediate;   Good Recent;   Fair Remote;   Fair  Judgement:  Fair  Insight:  Fair  Psychomotor Activity:  Normal  Concentration:  Concentration: Good and Attention Span: Good  Recall:  [employer] of Knowledge:  Fair  Language:  Good  Akathisia:  No  Handed:  Right  AIMS (if indicated):     Assets:  Housing Leisure Time Physical Health Resilience  ADL's:  Intact  Cognition:  WNL  Sleep:       Treatment Plan Summary: Daily contact with patient to assess and evaluate symptoms and  progress in treatment, Medication management and Plan intermittent explosive disorder:  -Crisis stabilization -Individual counseling -Medication management:  Listed below are medications he will be discharged on.  divalproex (DEPAKOTE ER) 500 MG 24 hr tablet Take 2 tablets (1,000 mg total) by mouth every morning.  gabapentin (NEURONTIN) 300 MG capsule Take 1 capsule (300 mg total) by mouth 3 (three) times daily.  hydrOXYzine (ATARAX/VISTARIL) 25 MG tablet Take 1 tablet (25 mg total) by mouth 3 (three) times daily.  levothyroxine (SYNTHROID, LEVOTHROID) 25 MCG tablet Take 1 tablet (25 mcg total) by mouth daily before breakfast.  LORazepam (ATIVAN) 1 MG tablet Take 1 tablet (1 mg total) by mouth 2 (two) times daily.  mirtazapine (REMERON) 15 MG tablet Take 1 tablet (15 mg total) by mouth at bedtime.  pantoprazole (PROTONIX) 40 MG tablet Take 1 tablet (40 mg total) by mouth daily.  QUEtiapine (SEROQUEL) 200 MG tablet Take 1 tablet (200 mg total) by mouth at bedtime.  sertraline (ZOLOFT) 100 MG tablet Take 1 tablet (100 mg total) by mouth daily.  tamsulosin (FLOMAX) 0.4 MG CAPS capsule Take 1 capsule (0.4 mg total) by mouth at bedtime.  traZODone (DESYREL) 300 MG tablet Take 1 tablet (300 mg total) by mouth at bedtime.    Disposition: No evidence of imminent risk to self or others at present.    Returning to group home  Velna Hatchet May Agustin, [facility] 03/18/2016 4:17 PM  Patient seen face-to-face for psychiatric evaluation, chart reviewed and case discussed with the physician extender and developed treatment plan. Reviewed the information documented and agree with the treatment plan. Thedore Mins, MD

## 2016-03-18 NOTE — ED Notes (Signed)
Contacted portable equipment to get fan for patient

## 2016-04-14 ENCOUNTER — Encounter (HOSPITAL_COMMUNITY): Payer: Self-pay | Admitting: *Deleted

## 2016-04-14 ENCOUNTER — Emergency Department (HOSPITAL_COMMUNITY): Payer: Medicare Other

## 2016-04-14 ENCOUNTER — Emergency Department (HOSPITAL_COMMUNITY)
Admission: EM | Admit: 2016-04-14 | Discharge: 2016-04-14 | Disposition: A | Discharge status: Home / Self Care | Payer: Medicare Other | Attending: Emergency Medicine | Admitting: Emergency Medicine

## 2016-04-14 DIAGNOSIS — S0101XA Laceration without foreign body of scalp, initial encounter: Secondary | ICD-10-CM | POA: Diagnosis not present

## 2016-04-14 DIAGNOSIS — S0990XA Unspecified injury of head, initial encounter: Secondary | ICD-10-CM | POA: Diagnosis present

## 2016-04-14 DIAGNOSIS — Y92129 Unspecified place in nursing home as the place of occurrence of the external cause: Secondary | ICD-10-CM | POA: Diagnosis not present

## 2016-04-14 DIAGNOSIS — Y999 Unspecified external cause status: Secondary | ICD-10-CM | POA: Insufficient documentation

## 2016-04-14 DIAGNOSIS — W1839XA Other fall on same level, initial encounter: Secondary | ICD-10-CM | POA: Insufficient documentation

## 2016-04-14 DIAGNOSIS — Z79899 Other long term (current) drug therapy: Secondary | ICD-10-CM | POA: Diagnosis not present

## 2016-04-14 DIAGNOSIS — Y939 Activity, unspecified: Secondary | ICD-10-CM | POA: Diagnosis not present

## 2016-04-14 DIAGNOSIS — Z87891 Personal history of nicotine dependence: Secondary | ICD-10-CM | POA: Insufficient documentation

## 2016-04-14 DIAGNOSIS — F79 Unspecified intellectual disabilities: Secondary | ICD-10-CM | POA: Insufficient documentation

## 2016-04-14 DIAGNOSIS — E039 Hypothyroidism, unspecified: Secondary | ICD-10-CM | POA: Insufficient documentation

## 2016-04-14 DIAGNOSIS — Z7951 Long term (current) use of inhaled steroids: Secondary | ICD-10-CM | POA: Insufficient documentation

## 2016-04-14 NOTE — ED Triage Notes (Signed)
Per [facility], pt has hx of schizophrenia, dementia & a developmental disorder.  Home Health stated he fell, hit his head.  Pt has lac to top of head.  His sister died x 1 mo. Ago and he has become more agitated.  C-collar applied PTA.

## 2016-04-14 NOTE — ED Provider Notes (Signed)
[facility]-EMERGENCY DEPT Provider Note   CSN: [id number] Arrival date & time: 04/14/16  1922     History   Chief Complaint Chief Complaint  Patient presents with  . Fall  . Head Laceration    HPI Jeff ChurnJohn Foster is a 55 y.o. male.  HPI Patient with mental retardation presents from his nursing facility after a fall. Patient self as repetitive speech, inconsistent speech, cannot provide any details of the history of present illness. Level V caveat secondary to mental retardation, cognitive delay. Patient presents via EMS, with a cervical collar in place, they report that the patient was found to have a fall. No report of change in interactivity, change from baseline behavior.  Past Medical History:  Diagnosis Date  . Intermittent explosive disorder   . MR (mental retardation)    Moderate Intellectual Developmental Disability  . Paranoid schizophrenia (HCC)   . Psychosis   . Seizure (HCC)   . Stroke (cerebrum) [facility](HCC)     Patient Active Problem List   Diagnosis Date Noted  . Anxiety, generalized 03/13/2016  . Intermittent explosive disorder 03/13/2016  . Urinary retention 03/10/2016  . Aggressive behavior 03/06/2016  . Essential hypertension   . Post-ictal confusion 10/27/2015  . Hypothyroidism 10/27/2015  . Thrombocytopenia (HCC) 10/27/2015  . Acute respiratory failure with hypoxia (HCC) 10/27/2015  . Seizure (HCC) 10/27/2015  . Psychosis 10/27/2015  . Normocytic anemia 10/27/2015  . DNR (do not resuscitate) 07/25/2015  . Aspiration pneumonia (HCC) 07/25/2015  . Palliative care encounter 07/25/2015  . Dysphagia   . Altered mental status   . UTI (lower urinary tract infection) 07/18/2015  . Cognitive decline 07/18/2015  . Acute encephalopathy 07/18/2015  . Mental retardation 07/18/2015  . Hypertension 07/18/2015  . History of CVA (cerebrovascular accident) 07/18/2015  . Encephalopathy acute 07/18/2015  . Dehydration     No past surgical history on  file.     Home Medications    Prior to Admission medications   Medication Sig Start Date End Date Taking? Authorizing Provider  acetaminophen (TYLENOL) 500 MG tablet Take 500 mg by mouth every 6 (six) hours as needed for mild pain, moderate pain, fever or headache.    Historical Provider, MD  albuterol (PROVENTIL HFA;VENTOLIN HFA) 108 (90 Base) MCG/ACT inhaler Inhale 2 puffs into the lungs every 6 (six) hours as needed for wheezing or shortness of breath.    Historical Provider, MD  atorvastatin (LIPITOR) 40 MG tablet Take 40 mg by mouth at bedtime.    Historical Provider, MD  chlorproMAZINE (THORAZINE) 50 MG tablet Take 1 tablet (50 mg total) by mouth 3 (three) times daily. 03/17/16   Charm RingsJamison Y Lord, NP  clonazePAM (KLONOPIN) 0.5 MG tablet Take 1 tablet (0.5 mg total) by mouth 2 (two) times daily. Patient taking differently: Take 0.5 mg by mouth 3 (three) times daily.  08/04/15   Meredeth IdeGagan S Lama, MD  divalproex (DEPAKOTE ER) 500 MG 24 hr tablet Take 1 tablet (500 mg total) by mouth every morning. Patient taking differently: Take 1,000 mg by mouth every morning.  10/28/15   Drema Dallasurtis J Woods, MD  divalproex (DEPAKOTE ER) 500 MG 24 hr tablet Take 2 tablets (1,000 mg total) by mouth every morning. 03/18/16   Adonis BrookSheila Agustin, NP  gabapentin (NEURONTIN) 300 MG capsule Take 1 capsule (300 mg total) by mouth 3 (three) times daily. 03/18/16   Adonis BrookSheila Agustin, NP  hydrOXYzine (ATARAX/VISTARIL) 25 MG tablet Take 1 tablet (25 mg total) by mouth 3 (three) times daily. 03/18/16  Adonis Brook, NP  hydrOXYzine (VISTARIL) 50 MG capsule Take 50 mg by mouth 2 (two) times daily as needed for anxiety.    Historical Provider, MD  levothyroxine (SYNTHROID, LEVOTHROID) 25 MCG tablet Take 25 mcg by mouth daily before breakfast.    Historical Provider, MD  levothyroxine (SYNTHROID, LEVOTHROID) 25 MCG tablet Take 1 tablet (25 mcg total) by mouth daily before breakfast. 03/19/16   Adonis Brook, NP  LORazepam (ATIVAN) 1 MG tablet  Take 1 tablet (1 mg total) by mouth 3 (three) times daily. 03/17/16   Charm Rings, NP  LORazepam (ATIVAN) 1 MG tablet Take 1 tablet (1 mg total) by mouth 2 (two) times daily. 03/18/16   Adonis Brook, NP  mirtazapine (REMERON) 15 MG tablet Take 15 mg by mouth at bedtime.    Historical Provider, MD  mirtazapine (REMERON) 15 MG tablet Take 1 tablet (15 mg total) by mouth at bedtime. 03/18/16   Adonis Brook, NP  omeprazole (PRILOSEC) 20 MG capsule Take 20 mg by mouth daily.    Historical Provider, MD  OXcarbazepine (TRILEPTAL) 150 MG tablet Take 150 mg by mouth 2 (two) times daily.    Historical Provider, MD  Oxcarbazepine (TRILEPTAL) 600 MG tablet Take 1 tablet (600 mg total) by mouth 2 (two) times daily. 03/17/16   Charm Rings, NP  pantoprazole (PROTONIX) 40 MG tablet Take 1 tablet (40 mg total) by mouth daily. 03/18/16   Adonis Brook, NP  QUEtiapine (SEROQUEL) 100 MG tablet Take 100 mg by mouth at bedtime.    Historical Provider, MD  QUEtiapine (SEROQUEL) 200 MG tablet Take 1 tablet (200 mg total) by mouth at bedtime. 03/18/16   Adonis Brook, NP  risperiDONE (RISPERDAL) 1 MG tablet Take 1 tablet (1 mg total) by mouth 2 (two) times daily. 08/04/15   Meredeth Ide, MD  sertraline (ZOLOFT) 100 MG tablet Take 1 tablet (100 mg total) by mouth daily. 03/06/16   Shuvon B Rankin, NP  sertraline (ZOLOFT) 100 MG tablet Take 1 tablet (100 mg total) by mouth daily. 03/18/16   Adonis Brook, NP  tamsulosin (FLOMAX) 0.4 MG CAPS capsule Take 0.4 mg by mouth at bedtime.    Historical Provider, MD  tamsulosin (FLOMAX) 0.4 MG CAPS capsule Take 1 capsule (0.4 mg total) by mouth at bedtime. 03/18/16   Adonis Brook, NP  traZODone (DESYREL) 300 MG tablet Take 1 tablet (300 mg total) by mouth at bedtime. 03/17/16   Charm Rings, NP  traZODone (DESYREL) 300 MG tablet Take 1 tablet (300 mg total) by mouth at bedtime. 03/18/16   Adonis Brook, NP    Family History No family history on file.  Social History Social  History  Substance Use Topics  . Smoking status: Former Games developer  . Smokeless tobacco: Never Used  . Alcohol use Not on file     Allergies   Other and Peanut-containing drug products   Review of Systems Review of Systems  Unable to perform ROS: Mental status change   Cognitive delay - retardation  Physical Exam Updated Vital Signs BP 116/74 (BP Location: Right Arm)   Pulse 77   Temp 97.5 F (36.4 C) (Oral)   Resp 18   SpO2 99%   Physical Exam  Constitutional: He appears well-developed. Cervical collar in place.  Agitated large M resting in gurney - answering questions simply w repetative speech  HENT:  Head: Normocephalic and atraumatic.    Eyes: Conjunctivae and EOM are normal.  Cardiovascular: Normal rate and regular  rhythm.   Pulmonary/Chest: Effort normal. No stridor. No respiratory distress.  Abdominal: He exhibits no distension.  Musculoskeletal: He exhibits no edema.  Neurological: He is alert. He displays no atrophy and no tremor. No cranial nerve deficit. He exhibits normal muscle tone.  Does not follow commands reliably, does MAES, has brief speech, no facial asym  Skin: Skin is warm and dry.  Psychiatric: Cognition and memory are impaired.  Nursing note and vitals reviewed.    ED Treatments / Results   Radiology I reviewed the CT imaging, agree with the interpretation.  Procedures Procedures (including critical care time)  Medications Ordered in ED Medications - No data to display   Initial Impression / Assessment and Plan / ED Course  I have reviewed the triage vital signs and the nursing notes.  Pertinent labs & imaging results that were available during my care of the patient were reviewed by me and considered in my medical decision making (see chart for details).  Clinical Course    LACERATION REPAIR Performed by: Gerhard Munch Authorized by: Gerhard Munch Consent: Verbal consent obtained. Risks and benefits: risks, benefits  and alternatives were discussed Consent given by: patient Patient identity confirmed: provided demographic data Prepped and Draped in normal sterile fashion Wound explored  Laceration Location: scalp - stellate  Laceration Length: 4cm  Irrigation method: syringe Amount of cleaning: standard  Skin closure: staples x2  Number of sutures: 2  Technique: as close as possible with stellate laceration.  Patient tolerance: Patient tolerated the procedure well with no immediate complications.   Final Clinical Impressions(s) / ED Diagnoses  Patient with mental retardation presents from his facility after a fall. Here the patient is awake and alert, seemingly interacting at baseline, but history, exam, Katie by his mental status. Evaluation here reassuring, CT is unremarkable, wound was repaired without complication. Patient discharged in stable condition.    Gerhard Munch, MD 04/14/16 2211

## 2016-04-14 NOTE — ED Notes (Signed)
Bed: [id number] Expected date:  Expected time:  Means of arrival:  Comments: EMS 55 yo M, Administrator, Civil Servicefall/laceration

## 2016-04-14 NOTE — ED Notes (Signed)
Pt's staff in to room @ this time.  Pt requesting something to eat.  Informed will speak to MD.

## 2016-06-07 ENCOUNTER — Encounter (HOSPITAL_COMMUNITY): Payer: Self-pay | Admitting: Emergency Medicine

## 2016-06-07 ENCOUNTER — Emergency Department (HOSPITAL_COMMUNITY): Payer: Medicare Other

## 2016-06-07 ENCOUNTER — Inpatient Hospital Stay (HOSPITAL_COMMUNITY)
Admission: EM | Admit: 2016-06-07 | Discharge: 2016-07-05 | DRG: 870 | Disposition: E | Payer: Medicare Other | Attending: Internal Medicine | Admitting: Internal Medicine

## 2016-06-07 DIAGNOSIS — J690 Pneumonitis due to inhalation of food and vomit: Secondary | ICD-10-CM | POA: Diagnosis present

## 2016-06-07 DIAGNOSIS — E871 Hypo-osmolality and hyponatremia: Secondary | ICD-10-CM

## 2016-06-07 DIAGNOSIS — R569 Unspecified convulsions: Secondary | ICD-10-CM

## 2016-06-07 DIAGNOSIS — R34 Anuria and oliguria: Secondary | ICD-10-CM | POA: Diagnosis present

## 2016-06-07 DIAGNOSIS — G9340 Encephalopathy, unspecified: Secondary | ICD-10-CM | POA: Diagnosis not present

## 2016-06-07 DIAGNOSIS — R6521 Severe sepsis with septic shock: Secondary | ICD-10-CM | POA: Diagnosis present

## 2016-06-07 DIAGNOSIS — D638 Anemia in other chronic diseases classified elsewhere: Secondary | ICD-10-CM | POA: Diagnosis present

## 2016-06-07 DIAGNOSIS — Z888 Allergy status to other drugs, medicaments and biological substances status: Secondary | ICD-10-CM

## 2016-06-07 DIAGNOSIS — J9621 Acute and chronic respiratory failure with hypoxia: Secondary | ICD-10-CM | POA: Diagnosis present

## 2016-06-07 DIAGNOSIS — R451 Restlessness and agitation: Secondary | ICD-10-CM | POA: Diagnosis present

## 2016-06-07 DIAGNOSIS — J9601 Acute respiratory failure with hypoxia: Secondary | ICD-10-CM

## 2016-06-07 DIAGNOSIS — F6381 Intermittent explosive disorder: Secondary | ICD-10-CM | POA: Diagnosis present

## 2016-06-07 DIAGNOSIS — Z8673 Personal history of transient ischemic attack (TIA), and cerebral infarction without residual deficits: Secondary | ICD-10-CM

## 2016-06-07 DIAGNOSIS — R4589 Other symptoms and signs involving emotional state: Secondary | ICD-10-CM | POA: Diagnosis not present

## 2016-06-07 DIAGNOSIS — Z87891 Personal history of nicotine dependence: Secondary | ICD-10-CM

## 2016-06-07 DIAGNOSIS — D696 Thrombocytopenia, unspecified: Secondary | ICD-10-CM | POA: Diagnosis present

## 2016-06-07 DIAGNOSIS — R4182 Altered mental status, unspecified: Secondary | ICD-10-CM | POA: Diagnosis present

## 2016-06-07 DIAGNOSIS — E785 Hyperlipidemia, unspecified: Secondary | ICD-10-CM | POA: Diagnosis present

## 2016-06-07 DIAGNOSIS — R404 Transient alteration of awareness: Secondary | ICD-10-CM | POA: Diagnosis not present

## 2016-06-07 DIAGNOSIS — J9620 Acute and chronic respiratory failure, unspecified whether with hypoxia or hypercapnia: Secondary | ICD-10-CM

## 2016-06-07 DIAGNOSIS — R001 Bradycardia, unspecified: Secondary | ICD-10-CM | POA: Diagnosis present

## 2016-06-07 DIAGNOSIS — K5900 Constipation, unspecified: Secondary | ICD-10-CM | POA: Diagnosis present

## 2016-06-07 DIAGNOSIS — N400 Enlarged prostate without lower urinary tract symptoms: Secondary | ICD-10-CM | POA: Diagnosis present

## 2016-06-07 DIAGNOSIS — Z781 Physical restraint status: Secondary | ICD-10-CM

## 2016-06-07 DIAGNOSIS — E860 Dehydration: Secondary | ICD-10-CM | POA: Diagnosis present

## 2016-06-07 DIAGNOSIS — E8770 Fluid overload, unspecified: Secondary | ICD-10-CM | POA: Diagnosis not present

## 2016-06-07 DIAGNOSIS — I959 Hypotension, unspecified: Secondary | ICD-10-CM | POA: Diagnosis present

## 2016-06-07 DIAGNOSIS — E038 Other specified hypothyroidism: Secondary | ICD-10-CM

## 2016-06-07 DIAGNOSIS — R1310 Dysphagia, unspecified: Secondary | ICD-10-CM | POA: Diagnosis present

## 2016-06-07 DIAGNOSIS — R251 Tremor, unspecified: Secondary | ICD-10-CM

## 2016-06-07 DIAGNOSIS — Z978 Presence of other specified devices: Secondary | ICD-10-CM

## 2016-06-07 DIAGNOSIS — R0902 Hypoxemia: Secondary | ICD-10-CM

## 2016-06-07 DIAGNOSIS — R32 Unspecified urinary incontinence: Secondary | ICD-10-CM | POA: Diagnosis present

## 2016-06-07 DIAGNOSIS — F79 Unspecified intellectual disabilities: Secondary | ICD-10-CM

## 2016-06-07 DIAGNOSIS — Z515 Encounter for palliative care: Secondary | ICD-10-CM | POA: Diagnosis not present

## 2016-06-07 DIAGNOSIS — Z01818 Encounter for other preprocedural examination: Secondary | ICD-10-CM

## 2016-06-07 DIAGNOSIS — R4689 Other symptoms and signs involving appearance and behavior: Secondary | ICD-10-CM | POA: Diagnosis present

## 2016-06-07 DIAGNOSIS — F200 Paranoid schizophrenia: Secondary | ICD-10-CM | POA: Diagnosis present

## 2016-06-07 DIAGNOSIS — G40909 Epilepsy, unspecified, not intractable, without status epilepticus: Secondary | ICD-10-CM | POA: Diagnosis present

## 2016-06-07 DIAGNOSIS — Z91010 Allergy to peanuts: Secondary | ICD-10-CM

## 2016-06-07 DIAGNOSIS — W19XXXA Unspecified fall, initial encounter: Secondary | ICD-10-CM | POA: Diagnosis present

## 2016-06-07 DIAGNOSIS — N179 Acute kidney failure, unspecified: Secondary | ICD-10-CM

## 2016-06-07 DIAGNOSIS — I10 Essential (primary) hypertension: Secondary | ICD-10-CM | POA: Diagnosis present

## 2016-06-07 DIAGNOSIS — R296 Repeated falls: Secondary | ICD-10-CM | POA: Diagnosis present

## 2016-06-07 DIAGNOSIS — A419 Sepsis, unspecified organism: Secondary | ICD-10-CM | POA: Diagnosis not present

## 2016-06-07 DIAGNOSIS — R410 Disorientation, unspecified: Secondary | ICD-10-CM

## 2016-06-07 DIAGNOSIS — Z7189 Other specified counseling: Secondary | ICD-10-CM

## 2016-06-07 DIAGNOSIS — Z4659 Encounter for fitting and adjustment of other gastrointestinal appliance and device: Secondary | ICD-10-CM

## 2016-06-07 DIAGNOSIS — J9690 Respiratory failure, unspecified, unspecified whether with hypoxia or hypercapnia: Secondary | ICD-10-CM

## 2016-06-07 DIAGNOSIS — J9600 Acute respiratory failure, unspecified whether with hypoxia or hypercapnia: Secondary | ICD-10-CM

## 2016-06-07 DIAGNOSIS — Z79899 Other long term (current) drug therapy: Secondary | ICD-10-CM

## 2016-06-07 DIAGNOSIS — E039 Hypothyroidism, unspecified: Secondary | ICD-10-CM | POA: Diagnosis present

## 2016-06-07 DIAGNOSIS — F0390 Unspecified dementia without behavioral disturbance: Secondary | ICD-10-CM | POA: Diagnosis present

## 2016-06-07 DIAGNOSIS — F411 Generalized anxiety disorder: Secondary | ICD-10-CM | POA: Diagnosis present

## 2016-06-07 DIAGNOSIS — J189 Pneumonia, unspecified organism: Secondary | ICD-10-CM

## 2016-06-07 DIAGNOSIS — B370 Candidal stomatitis: Secondary | ICD-10-CM | POA: Diagnosis present

## 2016-06-07 DIAGNOSIS — Z66 Do not resuscitate: Secondary | ICD-10-CM | POA: Diagnosis present

## 2016-06-07 LAB — BASIC METABOLIC PANEL
ANION GAP: 8 (ref 5–15)
BUN: 7 mg/dL (ref 6–20)
CO2: 30 mmol/L (ref 22–32)
Calcium: 9 mg/dL (ref 8.9–10.3)
Chloride: 91 mmol/L — ABNORMAL LOW (ref 101–111)
Creatinine, Ser: 0.72 mg/dL (ref 0.61–1.24)
GFR calc Af Amer: >60 mL/min (ref >60)
Glucose, Bld: 113 mg/dL — ABNORMAL HIGH (ref 65–99)
POTASSIUM: 4.4 mmol/L (ref 3.5–5.1)
SODIUM: 129 mmol/L — AB (ref 135–145)

## 2016-06-07 LAB — CBC WITH DIFFERENTIAL/PLATELET
BASOS ABS: 0 10*3/uL (ref 0.0–0.1)
Basophils Relative: 0 %
EOS ABS: 0.1 10*3/uL (ref 0.0–0.7)
EOS PCT: 2 %
HCT: 34 % — ABNORMAL LOW (ref 39.0–52.0)
Hemoglobin: 11.4 g/dL — ABNORMAL LOW (ref 13.0–17.0)
Lymphocytes Relative: 12 %
Lymphs Abs: 1 10*3/uL (ref 0.7–4.0)
MCH: 27.3 pg (ref 26.0–34.0)
MCHC: 33.5 g/dL (ref 30.0–36.0)
MCV: 81.3 fL (ref 78.0–100.0)
Monocytes Absolute: 1.5 10*3/uL — ABNORMAL HIGH (ref 0.1–1.0)
Monocytes Relative: 18 %
Neutro Abs: 5.6 10*3/uL (ref 1.7–7.7)
Neutrophils Relative %: 68 %
PLATELETS: 88 10*3/uL — AB (ref 150–400)
RBC: 4.18 MIL/uL — AB (ref 4.22–5.81)
RDW: 15 % (ref 11.5–15.5)
WBC: 8.2 10*3/uL (ref 4.0–10.5)

## 2016-06-07 LAB — TSH: TSH: 0.876 u[IU]/mL (ref 0.350–4.500)

## 2016-06-07 LAB — URINE MICROSCOPIC-ADD ON

## 2016-06-07 LAB — VALPROIC ACID LEVEL: Valproic Acid Lvl: 73 ug/mL (ref 50.0–100.0)

## 2016-06-07 LAB — URINALYSIS, ROUTINE W REFLEX MICROSCOPIC
BILIRUBIN URINE: NEGATIVE
Glucose, UA: NEGATIVE mg/dL
Ketones, ur: NEGATIVE mg/dL
Leukocytes, UA: NEGATIVE
Nitrite: NEGATIVE
PROTEIN: NEGATIVE mg/dL
Specific Gravity, Urine: 1.012 (ref 1.005–1.030)
pH: 7 (ref 5.0–8.0)

## 2016-06-07 LAB — I-STAT CG4 LACTIC ACID, ED: LACTIC ACID, VENOUS: 1.43 mmol/L (ref 0.5–1.9)

## 2016-06-07 MED ORDER — MIRTAZAPINE 15 MG PO TABS
15.0000 mg | ORAL_TABLET | Freq: Every day | ORAL | Status: DC
  Administered 2016-06-07 – 2016-06-17 (×10): 15 mg via ORAL
  Filled 2016-06-07: qty 1
  Filled 2016-06-07: qty 0.5
  Filled 2016-06-07 (×3): qty 1
  Filled 2016-06-07: qty 0.5
  Filled 2016-06-07 (×3): qty 1
  Filled 2016-06-07: qty 0.5
  Filled 2016-06-07 (×2): qty 1

## 2016-06-07 MED ORDER — OXCARBAZEPINE 300 MG PO TABS
600.0000 mg | ORAL_TABLET | Freq: Two times a day (BID) | ORAL | Status: DC
  Administered 2016-06-07 – 2016-06-15 (×14): 600 mg via ORAL
  Filled 2016-06-07 (×17): qty 2

## 2016-06-07 MED ORDER — SODIUM CHLORIDE 0.9 % IV SOLN: Freq: Once | INTRAVENOUS | Status: DC

## 2016-06-07 MED ORDER — SODIUM CHLORIDE 0.9 % IV SOLN
Freq: Once | INTRAVENOUS | Status: AC
  Administered 2016-06-07: 16:00:00 via INTRAVENOUS

## 2016-06-07 MED ORDER — LEVOTHYROXINE SODIUM 25 MCG PO TABS
25.0000 ug | ORAL_TABLET | Freq: Every day | ORAL | Status: DC
  Administered 2016-06-08 – 2016-06-18 (×11): 25 ug via ORAL
  Filled 2016-06-07 (×11): qty 1

## 2016-06-07 MED ORDER — ONDANSETRON HCL 4 MG PO TABS: 4.0000 mg | ORAL_TABLET | Freq: Four times a day (QID) | ORAL | Status: DC | PRN

## 2016-06-07 MED ORDER — SERTRALINE HCL 100 MG PO TABS
100.0000 mg | ORAL_TABLET | Freq: Every day | ORAL | Status: DC
  Administered 2016-06-08 – 2016-06-18 (×11): 100 mg via ORAL
  Filled 2016-06-07 (×3): qty 1
  Filled 2016-06-07: qty 2
  Filled 2016-06-07 (×3): qty 1
  Filled 2016-06-07: qty 2
  Filled 2016-06-07 (×3): qty 1

## 2016-06-07 MED ORDER — TAMSULOSIN HCL 0.4 MG PO CAPS
0.4000 mg | ORAL_CAPSULE | Freq: Every day | ORAL | Status: DC
  Administered 2016-06-07 – 2016-06-09 (×3): 0.4 mg via ORAL
  Filled 2016-06-07 (×4): qty 1

## 2016-06-07 MED ORDER — ATORVASTATIN CALCIUM 40 MG PO TABS
40.0000 mg | ORAL_TABLET | Freq: Every day | ORAL | Status: DC
  Administered 2016-06-07 – 2016-06-09 (×3): 40 mg via ORAL
  Filled 2016-06-07 (×4): qty 1

## 2016-06-07 MED ORDER — HYDROXYZINE HCL 25 MG PO TABS
25.0000 mg | ORAL_TABLET | Freq: Three times a day (TID) | ORAL | Status: DC | PRN
  Administered 2016-06-10: 25 mg via ORAL
  Filled 2016-06-07: qty 1

## 2016-06-07 MED ORDER — PANTOPRAZOLE SODIUM 40 MG PO TBEC
40.0000 mg | DELAYED_RELEASE_TABLET | Freq: Every day | ORAL | Status: DC
  Administered 2016-06-08 – 2016-06-10 (×3): 40 mg via ORAL
  Filled 2016-06-07 (×3): qty 1

## 2016-06-07 MED ORDER — GABAPENTIN 300 MG PO CAPS
300.0000 mg | ORAL_CAPSULE | Freq: Three times a day (TID) | ORAL | Status: DC
  Administered 2016-06-08 – 2016-06-10 (×7): 300 mg via ORAL
  Filled 2016-06-07: qty 1
  Filled 2016-06-07: qty 3
  Filled 2016-06-07 (×6): qty 1
  Filled 2016-06-07: qty 3
  Filled 2016-06-07: qty 1

## 2016-06-07 MED ORDER — QUETIAPINE FUMARATE 100 MG PO TABS
200.0000 mg | ORAL_TABLET | Freq: Every day | ORAL | Status: DC
  Administered 2016-06-08: 200 mg via ORAL
  Filled 2016-06-07 (×2): qty 2

## 2016-06-07 MED ORDER — TRAZODONE HCL 100 MG PO TABS
300.0000 mg | ORAL_TABLET | Freq: Every day | ORAL | Status: DC
  Administered 2016-06-07 – 2016-06-08 (×2): 300 mg via ORAL
  Filled 2016-06-07 (×2): qty 3

## 2016-06-07 MED ORDER — DIVALPROEX SODIUM 250 MG PO DR TAB
1000.0000 mg | DELAYED_RELEASE_TABLET | Freq: Every day | ORAL | Status: DC
  Administered 2016-06-08 – 2016-06-10 (×3): 1000 mg via ORAL
  Filled 2016-06-07 (×4): qty 4

## 2016-06-07 MED ORDER — CHLORPROMAZINE HCL 50 MG PO TABS
50.0000 mg | ORAL_TABLET | Freq: Three times a day (TID) | ORAL | Status: DC
  Administered 2016-06-07 – 2016-06-10 (×8): 50 mg via ORAL
  Filled 2016-06-07 (×2): qty 2
  Filled 2016-06-07: qty 1
  Filled 2016-06-07: qty 2
  Filled 2016-06-07 (×2): qty 1
  Filled 2016-06-07: qty 2
  Filled 2016-06-07 (×2): qty 1
  Filled 2016-06-07 (×3): qty 2

## 2016-06-07 MED ORDER — LORAZEPAM 1 MG PO TABS
1.0000 mg | ORAL_TABLET | Freq: Two times a day (BID) | ORAL | Status: DC
  Administered 2016-06-07 – 2016-06-10 (×6): 1 mg via ORAL
  Filled 2016-06-07 (×7): qty 1

## 2016-06-07 MED ORDER — ENOXAPARIN SODIUM 40 MG/0.4ML ~~LOC~~ SOLN
40.0000 mg | Freq: Every day | SUBCUTANEOUS | Status: DC
  Administered 2016-06-08: 40 mg via SUBCUTANEOUS
  Filled 2016-06-07 (×2): qty 0.4

## 2016-06-07 MED ORDER — ONDANSETRON HCL 4 MG/2ML IJ SOLN
4.0000 mg | Freq: Four times a day (QID) | INTRAMUSCULAR | Status: DC | PRN
  Administered 2016-06-08: 4 mg via INTRAVENOUS
  Filled 2016-06-07: qty 2

## 2016-06-07 NOTE — ED Notes (Signed)
Provided a urinal due to patient calling out that he needed to urinate.

## 2016-06-07 NOTE — ED Notes (Signed)
PT and caretaker have been made aware of urine sample. PT state he does know when he have to go. Caretaker will info staff when PT able to give urine sample

## 2016-06-07 NOTE — ED Notes (Signed)
Hospitalist has been to assess patient. When hospitalist came to bedside, pt was standing up. Redirected patient back into bed.

## 2016-06-07 NOTE — Progress Notes (Signed)
Patient is from [facility]. Contact Georgina PeerDerrick McDowell [phone] for discharge needs.   Stacy GardnerErin Brendan Gadson, LCSWA Clinical Social Worker [phone]

## 2016-06-07 NOTE — ED Notes (Signed)
Matt, RN is transporting patient upstairs to room 1529 and will give report at bedside.

## 2016-06-07 NOTE — ED Notes (Signed)
RN to pull labs from IV 

## 2016-06-07 NOTE — H&P (Addendum)
History and Physical    Ameya Kutz ZOX:[id number] DOB: [birth year] DOA: 2016-06-25  Referring MD/NP/PA: Dr. Mal Misty PCP: Diamantina Providence, FNP  Patient coming from: Group home  Chief Complaint: Tremors and falls  HPI: Corben Auzenne is a 55 y.o. male with medical history significant of MR, intermittent explosive behavior, psychosis, seizure disorder, CVA,dysphagia, hypothyroidism, anemia, and thrombocytopenia; who presents with tremors and falls. History is obtained via report as patient has significant meantal retardation unable to give his own history. Patient was noted over last 24 hours to have been coming more drowsy confused and unable to walk even with a walker. No report of any seizure-like activity was noted. Patient has had this happen previously and was related to either do to infection dehydration or medication changes. There was reports of dysuria and frequency.   ED Course: Vitals were relatively unremarkable. Lab work revealed hemoglobin 11.4, platelets 88, sodium 129, potassium 4.491, CO2 30, BUN 7, creatinine 0.62, lactic acid 1.43, glucose 113, TSH 0.876, and valporate level 73.. Patient was evaluated with urinalysis and chest x-ray which both appeared negative for any signs of infection. MRI was completed and showed no acute abnormalities to suggest reason for patient's tremors and frequent falls.    Review of Systems: Unable to obtain secondary to developmental delay.  Past Medical History:  Diagnosis Date  . Intermittent explosive disorder   . MR (mental retardation)    Moderate Intellectual Developmental Disability  . Paranoid schizophrenia (HCC)   . Psychosis   . Seizure (HCC)   . Stroke (cerebrum) [facility])     History reviewed. No pertinent surgical history.   reports that he has quit smoking. He has never used smokeless tobacco. His alcohol and drug histories are not on file.  Allergies  Allergen Reactions  . Other Other (See Comments)    Pt is allergic to chlorine  and bleach.   Reaction:  Unknown   . Peanut-Containing Drug Products Diarrhea    History reviewed. No pertinent family history.  Prior to Admission medications   Medication Sig Start Date End Date Taking? Authorizing Provider  atorvastatin (LIPITOR) 40 MG tablet Take 40 mg by mouth at bedtime.   Yes Historical Provider, MD  chlorproMAZINE (THORAZINE) 50 MG tablet Take 1 tablet (50 mg total) by mouth 3 (three) times daily. 03/17/16  Yes Charm Rings, NP  divalproex (DEPAKOTE) 500 MG DR tablet Take 1,000 mg by mouth daily with breakfast.   Yes Historical Provider, MD  gabapentin (NEURONTIN) 300 MG capsule Take 1 capsule (300 mg total) by mouth 3 (three) times daily. 03/18/16  Yes Adonis Brook, NP  hydrOXYzine (ATARAX/VISTARIL) 25 MG tablet Take 1 tablet (25 mg total) by mouth 3 (three) times daily. 03/18/16  Yes Adonis Brook, NP  levothyroxine (SYNTHROID, LEVOTHROID) 25 MCG tablet Take 1 tablet (25 mcg total) by mouth daily before breakfast. 03/19/16  Yes Adonis Brook, NP  LORazepam (ATIVAN) 1 MG tablet Take 1 tablet (1 mg total) by mouth 2 (two) times daily. 03/18/16  Yes Adonis Brook, NP  mirtazapine (REMERON) 15 MG tablet Take 1 tablet (15 mg total) by mouth at bedtime. 03/18/16  Yes Adonis Brook, NP  Oxcarbazepine (TRILEPTAL) 600 MG tablet Take 1 tablet (600 mg total) by mouth 2 (two) times daily. 03/17/16  Yes Charm Rings, NP  pantoprazole (PROTONIX) 40 MG tablet Take 1 tablet (40 mg total) by mouth daily. 03/18/16  Yes Adonis Brook, NP  QUEtiapine (SEROQUEL) 200 MG tablet Take 1 tablet (200 mg total)  by mouth at bedtime. 03/18/16  Yes Adonis Brook, NP  sertraline (ZOLOFT) 100 MG tablet Take 1 tablet (100 mg total) by mouth daily. 03/18/16  Yes Adonis Brook, NP  tamsulosin (FLOMAX) 0.4 MG CAPS capsule Take 1 capsule (0.4 mg total) by mouth at bedtime. 03/18/16  Yes Adonis Brook, NP  traZODone (DESYREL) 300 MG tablet Take 1 tablet (300 mg total) by mouth at bedtime. 03/18/16  Yes  Adonis Brook, NP    Physical Exam:    Constitutional: Older male who appears acutely agitated Vitals:   2016/06/13 1028 13-Jun-2016 1321 06-13-16 1621  BP: 129/83 138/85 151/93  Pulse: 80 72 68  Resp: 18 18 18   Temp: 97.4 F (36.3 C)    TempSrc: Axillary    SpO2: 93% 93% 95%  Weight: 91.6 kg (202 lb)     Eyes: PERRL, lids and conjunctivae normal ENMT: Mucous membranes are dry. Posterior pharynx clear of any exudate or lesions.Normal dentition.  Neck: normal, supple, no masses, no thyromegaly Respiratory: clear to auscultation bilaterally, no wheezing, no crackles. Normal respiratory effort. No accessory muscle use.  Cardiovascular: Regular rate and rhythm, no murmurs / rubs / gallops. No extremity edema. 2+ pedal pulses. No carotid bruits.  Abdomen: no tenderness, no masses palpated. No hepatosplenomegaly. Bowel sounds positive.  Musculoskeletal: no clubbing / cyanosis. No joint deformity upper and lower extremities. Good ROM, no contractures. Normal muscle tone.  Skin: no rashes, lesions, ulcers. No induration Neurologic: CN 2-12 grossly intact. Sensation intact, DTR normal. Strength 5/5 in all 4.  Psychiatric: alert and agitated not readily following commands    Labs on Admission: I have personally reviewed following labs and imaging studies  CBC:  Recent Labs Lab 06-13-2016 1129  WBC 8.2  NEUTROABS 5.6  HGB 11.4*  HCT 34.0*  MCV 81.3  PLT 88*   Basic Metabolic Panel:  Recent Labs Lab Jun 13, 2016 1129  NA 129*  K 4.4  CL 91*  CO2 30  GLUCOSE 113*  BUN 7  CREATININE 0.72  CALCIUM 9.0   GFR: Estimated Creatinine Clearance: 119.9 mL/min (by C-G formula based on SCr of 0.72 mg/dL). Liver Function Tests: No results for input(s): AST, ALT, ALKPHOS, BILITOT, PROT, ALBUMIN in the last 168 hours. No results for input(s): LIPASE, AMYLASE in the last 168 hours. No results for input(s): AMMONIA in the last 168 hours. Coagulation Profile: No results for input(s): INR,  PROTIME in the last 168 hours. Cardiac Enzymes: No results for input(s): CKTOTAL, CKMB, CKMBINDEX, TROPONINI in the last 168 hours. BNP (last 3 results) No results for input(s): PROBNP in the last 8760 hours. HbA1C: No results for input(s): HGBA1C in the last 72 hours. CBG: No results for input(s): GLUCAP in the last 168 hours. Lipid Profile: No results for input(s): CHOL, HDL, LDLCALC, TRIG, CHOLHDL, LDLDIRECT in the last 72 hours. Thyroid Function Tests:  Recent Labs  Jun 13, 2016 1451  TSH 0.876   Anemia Panel: No results for input(s): VITAMINB12, FOLATE, FERRITIN, TIBC, IRON, RETICCTPCT in the last 72 hours. Urine analysis:    Component Value Date/Time   COLORURINE YELLOW 06-13-16 1321   APPEARANCEUR CLEAR 13-Jun-2016 1321   LABSPEC 1.012 Jun 13, 2016 1321   PHURINE 7.0 June 13, 2016 1321   GLUCOSEU NEGATIVE June 13, 2016 1321   HGBUR SMALL (A) Jun 13, 2016 1321   BILIRUBINUR NEGATIVE 13-Jun-2016 1321   KETONESUR NEGATIVE June 13, 2016 1321   PROTEINUR NEGATIVE 06/13/2016 1321   NITRITE NEGATIVE 2016/06/13 1321   LEUKOCYTESUR NEGATIVE 06/13/16 1321   Sepsis Labs: No results found for  this or any previous visit (from the past 240 hour(s)).   Radiological Exams on Admission: Dg Chest 2 View  Result Date: 06/08/2016 CLINICAL DATA:  Altered mental status. EXAM: CHEST  2 VIEW COMPARISON:  Radiographs of March 04, 2016. FINDINGS: The heart size and mediastinal contours are within normal limits. Both lungs are clear. No pneumothorax or pleural effusion is noted. The visualized skeletal structures are unremarkable. IMPRESSION: No active cardiopulmonary disease. Electronically Signed   By: Lupita RaiderJames  Green Jr, M.D.   On: 07/03/2016 13:14   Ct Head Wo Contrast  Result Date: 06/19/2016 CLINICAL DATA:  Altered mental status with decreasing balance in a fall 2 days ago. EXAM: CT HEAD WITHOUT CONTRAST TECHNIQUE: Contiguous axial images were obtained from the base of the skull through the vertex  without intravenous contrast. COMPARISON:  04/14/2016 FINDINGS: Brain: There is no evidence for acute hemorrhage, hydrocephalus, mass lesion, or abnormal extra-axial fluid collection. No definite CT evidence for acute infarction. Diffuse loss of parenchymal volume is consistent with atrophy. Patchy low attenuation in the deep hemispheric and periventricular white matter is nonspecific, but likely reflects chronic microvascular ischemic demyelination. Vascular: No hyperdense vessel or unexpected calcification. Skull: No evidence for fracture. No worrisome lytic or sclerotic lesion. Sinuses/Orbits: The visualized paranasal sinuses and mastoid air cells are clear. Visualized portions of the globes and intraorbital fat are unremarkable. Other: None. IMPRESSION: Stable.  No acute intracranial abnormality. Atrophy with chronic small vessel white matter ischemic disease. Electronically Signed   By: Kennith CenterEric  Mansell M.D.   On: 06/04/2016 13:28   Mr Brain Wo Contrast  Result Date: 06/04/2016 CLINICAL DATA:  55 y/o M; history of increasing drowsiness, confusion, and inability to walk. History of stroke, seizures, and schizophrenia appear EXAM: MRI HEAD WITHOUT CONTRAST TECHNIQUE: Multiplanar, multiecho pulse sequences of the brain and surrounding structures were obtained without intravenous contrast. COMPARISON:  06/27/2016 CT head.  10/27/2015 MRI head. FINDINGS: Brain: Motion degraded T2 FLAIR sequence and inversion recovery sequence. Axial T2 weighted sequence demonstrate small foci of T2 hyperintensity in the bilateral lentiform nuclei and left anterior corona radiata compatible with chronic lacunar infarcts and/or perivascular spaces. No diffusion signal abnormality. No abnormal susceptibility hypointensity to indicate intracranial hemorrhage. No focal mass effect. Moderate brain parenchymal volume loss is stable. Stable ventricle size. Vascular: Normal flow voids. Skull and upper cervical spine: Normal marrow signal.  Sinuses/Orbits: Mild diffuse paranasal sinus mucosal thickening and partial opacification of anterior ethmoid air cells. No abnormal signal of mastoid air cells. Orbits are unremarkable. Other: None. IMPRESSION: 1. Motion degraded study. 2. No evidence of acute infarct, focal mass effect, or intracranial hemorrhage. 3. Stable moderate brain parenchymal volume loss and chronic basal ganglia lacunar infarcts. 4. Mild paranasal sinus disease greatest in anterior ethmoid air cells. Electronically Signed   By: Mitzi HansenLance  Furusawa-Stratton M.D.   On: 06/06/2016 19:03     Assessment/Plan Acute encephalopathy: Resolved. Patient was initially noted to be more lethargic and less arousable. However, now more alert noted to be getting up and aggressive toward staff which appears to be close to patient's baseline. - Admit to MedSurg bed - Sitter to bedside for patient safety  Hyponatremia: Acute. Patient  presents with sodium of 129 with low sodium chloride level. Suspect hyponatremia could be related to dehydration possibly. Low-sodium levels could have been contributing to patient's acute symptoms of lethargy.  - IV fluids normal saline at 100 ml /hour - Recheck BMP in a.m.  Mental retardation/psychosis/intermittent explosive behavior - Continue Seroquel, Depakote, mirtazapine,  Thorazine, trazodone - May want to get psychiatry consult in a.m. as patient may need an adjustment of these medications   Seizure disorder - Continue Trileptal  Hypothyroidism: TSH noted to be 0.876 on admission. - Continue Levothyroxine   Anemia hemoglobin chronic. Hemoglobin 11.4 on admission which appears close to the patient's baseline. - Recheck CBC in a.m.   Thrombocytopenia: Acute on chronic. Platelet count 88 but no acute signs of bleeding. - Continue to monitor  Hyperlipidemia - Continue atorvastatin    BPH - Continue Flomax    DVT prophylaxis: lovenox Code Status: Full Family Communication: No family present  at bedside  Disposition Plan: Likely back to group home once stable  Consults called: none Admission status: Observation  Clydie Braunondell A Smith MD [facility] Pager [phone]  If 7PM-7AM, please contact night-coverage [email or web address] Password [email or web address]  06/24/2016, 7:53 PM

## 2016-06-07 NOTE — ED Notes (Signed)
Patient is refusing to obtain vital signs. Patient randomly hollers and yells at time. Patient does not have visitors at bedside.

## 2016-06-07 NOTE — ED Notes (Addendum)
MRI called to get an estimate for when scan will take place. They state it should be a little after 1630.

## 2016-06-07 NOTE — Progress Notes (Signed)
Group Home owner Georgina PeerDerrick McDowell would like to be updated on patients care [phone]. Patient has legal guardian through the Department of Social Services, [name] [phone]. Legal guardian and group home would like to be updated on patient.   Stacy GardnerErin Otila Starn, LCSWA Clinical Social Worker [phone]

## 2016-06-07 NOTE — Plan of Care (Signed)
Patient is a 55 year old male with history of stroke, seizure, schizophrenia, intellectual developmental disability. He presents with acute delirium. He also has inability to walk on his feet. Lab work significant for hyponatremia and thrombus cytopenia. According to sign out, patient is dehydrated. MRI is being obtained to rule out stroke. Patient currently in MRI and I could not see him. He is currently in a group home, and she'll be contacted at [phone] extension 2

## 2016-06-07 NOTE — ED Triage Notes (Signed)
Pt is from his own home where he lives with a caretaker.  Caretaker states that Pt began having tremors on Friday.  He had decreased balance and a fall on Saturday with no injury.  He c/o being cold yesterday though Caretaker states that Pt has been afebrile.  Pt did c/o dysuria beginning on Saturday.

## 2016-06-07 NOTE — ED Notes (Signed)
Pt refuses vital signs, will try again later on.

## 2016-06-07 NOTE — ED Provider Notes (Signed)
[facility]-EMERGENCY DEPT Provider Note   CSN: [id number] Arrival date & time: Jun 22, 2016  1012     History   Chief Complaint Chief Complaint  Patient presents with  . Fall  . Tremors    HPI Jeff Foster is a 55 y.o. male.  HPI 55 year old male with past medical history as below who presents with tremors and falls. History limited 2/2 MR, schizophrenia, h/o stroke. Per report from patient's home health provider, pt is normally fairly alert, able to ambulate on his own at baseline. Over the past 24 hours, he has become increasingly drowsy, confused, and is now unable to walk on his own even with a walker. This has happened in setting of infection, med changes in the past. No fevers known. He has complained of mild dysuria and frequency.  Level 5 caveat invoked as remainder of history, ROS, and physical exam limited due to patient's AMS, baseline dementia   Past Medical History:  Diagnosis Date  . Intermittent explosive disorder   . MR (mental retardation)    Moderate Intellectual Developmental Disability  . Paranoid schizophrenia (HCC)   . Psychosis   . Seizure (HCC)   . Stroke (cerebrum) [facility])     Patient Active Problem List   Diagnosis Date Noted  . Anxiety, generalized 03/13/2016  . Intermittent explosive disorder 03/13/2016  . Urinary retention 03/10/2016  . Aggressive behavior 03/06/2016  . Essential hypertension   . Post-ictal confusion 10/27/2015  . Hypothyroidism 10/27/2015  . Thrombocytopenia (HCC) 10/27/2015  . Acute respiratory failure with hypoxia (HCC) 10/27/2015  . Seizure (HCC) 10/27/2015  . Psychosis 10/27/2015  . Normocytic anemia 10/27/2015  . DNR (do not resuscitate) 07/25/2015  . Aspiration pneumonia (HCC) 07/25/2015  . Palliative care encounter 07/25/2015  . Dysphagia   . Altered mental status   . UTI (lower urinary tract infection) 07/18/2015  . Cognitive decline 07/18/2015  . Acute encephalopathy 07/18/2015  . Mental retardation 07/18/2015  .  Hypertension 07/18/2015  . History of CVA (cerebrovascular accident) 07/18/2015  . Encephalopathy acute 07/18/2015  . Dehydration     History reviewed. No pertinent surgical history.     Home Medications    Prior to Admission medications   Medication Sig Start Date End Date Taking? Authorizing Provider  atorvastatin (LIPITOR) 40 MG tablet Take 40 mg by mouth at bedtime.   Yes Historical Provider, MD  chlorproMAZINE (THORAZINE) 50 MG tablet Take 1 tablet (50 mg total) by mouth 3 (three) times daily. 03/17/16  Yes Charm Rings, NP  divalproex (DEPAKOTE) 500 MG DR tablet Take 1,000 mg by mouth daily with breakfast.   Yes Historical Provider, MD  gabapentin (NEURONTIN) 300 MG capsule Take 1 capsule (300 mg total) by mouth 3 (three) times daily. 03/18/16  Yes Adonis Brook, NP  hydrOXYzine (ATARAX/VISTARIL) 25 MG tablet Take 1 tablet (25 mg total) by mouth 3 (three) times daily. 03/18/16  Yes Adonis Brook, NP  levothyroxine (SYNTHROID, LEVOTHROID) 25 MCG tablet Take 1 tablet (25 mcg total) by mouth daily before breakfast. 03/19/16  Yes Adonis Brook, NP  LORazepam (ATIVAN) 1 MG tablet Take 1 tablet (1 mg total) by mouth 2 (two) times daily. 03/18/16  Yes Adonis Brook, NP  mirtazapine (REMERON) 15 MG tablet Take 1 tablet (15 mg total) by mouth at bedtime. 03/18/16  Yes Adonis Brook, NP  Oxcarbazepine (TRILEPTAL) 600 MG tablet Take 1 tablet (600 mg total) by mouth 2 (two) times daily. 03/17/16  Yes Charm Rings, NP  pantoprazole (PROTONIX) 40 MG tablet  Take 1 tablet (40 mg total) by mouth daily. 03/18/16  Yes Adonis BrookSheila Agustin, NP  QUEtiapine (SEROQUEL) 200 MG tablet Take 1 tablet (200 mg total) by mouth at bedtime. 03/18/16  Yes Adonis BrookSheila Agustin, NP  sertraline (ZOLOFT) 100 MG tablet Take 1 tablet (100 mg total) by mouth daily. 03/18/16  Yes Adonis BrookSheila Agustin, NP  tamsulosin (FLOMAX) 0.4 MG CAPS capsule Take 1 capsule (0.4 mg total) by mouth at bedtime. 03/18/16  Yes Adonis BrookSheila Agustin, NP  traZODone  (DESYREL) 300 MG tablet Take 1 tablet (300 mg total) by mouth at bedtime. 03/18/16  Yes Adonis BrookSheila Agustin, NP    Family History History reviewed. No pertinent family history.  Social History Social History  Substance Use Topics  . Smoking status: Former Games developermoker  . Smokeless tobacco: Never Used  . Alcohol use Not on file     Allergies   Other and Peanut-containing drug products   Review of Systems Review of Systems  Unable to perform ROS: Dementia     Physical Exam Updated Vital Signs BP 151/93   Pulse 68   Temp 97.4 F (36.3 C) (Axillary)   Resp 18   Wt 202 lb (91.6 kg)   SpO2 95%   BMI 25.94 kg/m   Physical Exam  Constitutional: He appears well-developed and well-nourished. No distress.  HENT:  Head: Normocephalic and atraumatic.  Eyes: Conjunctivae are normal.  Neck: Neck supple.  Cardiovascular: Normal rate, regular rhythm and normal heart sounds.  Exam reveals no friction rub.   No murmur heard. Pulmonary/Chest: Effort normal and breath sounds normal. No respiratory distress. He has no wheezes. He has no rales.  Abdominal: He exhibits no distension.  Musculoskeletal: He exhibits no edema.  Neurological:  Alert but with slurred speech. No gross CN deficits. Tongue appears midline. No nystagmus. MAE with at least antigravity strength. Withdraws to noxious stimuli b/l UE and LE. Unable to participate in gait or FTN/HTS testing.  Skin: Skin is warm. Capillary refill takes less than 2 seconds.  Psychiatric: He has a normal mood and affect.  Nursing note and vitals reviewed.    ED Treatments / Results  Labs (all labs ordered are listed, but only abnormal results are displayed) Labs Reviewed  URINALYSIS, ROUTINE W REFLEX MICROSCOPIC (NOT [facility]) - Abnormal; Notable for the following:       Result Value   Hgb urine dipstick SMALL (*)    All other components within normal limits  CBC WITH DIFFERENTIAL/PLATELET - Abnormal; Notable for the following:    RBC 4.18  (*)    Hemoglobin 11.4 (*)    HCT 34.0 (*)    Platelets 88 (*)    Monocytes Absolute 1.5 (*)    All other components within normal limits  BASIC METABOLIC PANEL - Abnormal; Notable for the following:    Sodium 129 (*)    Chloride 91 (*)    Glucose, Bld 113 (*)    All other components within normal limits  URINE MICROSCOPIC-ADD ON - Abnormal; Notable for the following:    Squamous Epithelial / LPF 0-5 (*)    Bacteria, UA RARE (*)    All other components within normal limits  VALPROIC ACID LEVEL  TSH  I-STAT CG4 LACTIC ACID, ED    EKG  EKG Interpretation None       Radiology Dg Chest 2 View  Result Date: 06/28/2016 CLINICAL DATA:  Altered mental status. EXAM: CHEST  2 VIEW COMPARISON:  Radiographs of March 04, 2016. FINDINGS: The heart size  and mediastinal contours are within normal limits. Both lungs are clear. No pneumothorax or pleural effusion is noted. The visualized skeletal structures are unremarkable. IMPRESSION: No active cardiopulmonary disease. Electronically Signed   By: Lupita RaiderJames  Green Jr, M.D.   On: 03/15/16 13:14   Ct Head Wo Contrast  Result Date: 06/30/2016 CLINICAL DATA:  Altered mental status with decreasing balance in a fall 2 days ago. EXAM: CT HEAD WITHOUT CONTRAST TECHNIQUE: Contiguous axial images were obtained from the base of the skull through the vertex without intravenous contrast. COMPARISON:  04/14/2016 FINDINGS: Brain: There is no evidence for acute hemorrhage, hydrocephalus, mass lesion, or abnormal extra-axial fluid collection. No definite CT evidence for acute infarction. Diffuse loss of parenchymal volume is consistent with atrophy. Patchy low attenuation in the deep hemispheric and periventricular white matter is nonspecific, but likely reflects chronic microvascular ischemic demyelination. Vascular: No hyperdense vessel or unexpected calcification. Skull: No evidence for fracture. No worrisome lytic or sclerotic lesion. Sinuses/Orbits: The  visualized paranasal sinuses and mastoid air cells are clear. Visualized portions of the globes and intraorbital fat are unremarkable. Other: None. IMPRESSION: Stable.  No acute intracranial abnormality. Atrophy with chronic small vessel white matter ischemic disease. Electronically Signed   By: Kennith CenterEric  Mansell M.D.   On: 03/15/16 13:28    Procedures Procedures (including critical care time)  Medications Ordered in ED Medications  0.9 %  sodium chloride infusion ( Intravenous New Bag/Given 06/14/2016 1623)     Initial Impression / Assessment and Plan / ED Course  I have reviewed the triage vital signs and the nursing notes.  Pertinent labs & imaging results that were available during my care of the patient were reviewed by me and considered in my medical decision making (see chart for details).  Clinical Course     55 yo M with PMHx as above here with confusion, gait instability, and tremor. H/o recurrent episodes 2/2 delirium. Suspect acute delirium/encephalopathy 2/2 possible occult infection (UTI, PNA), metabolic derangement such as hyponatremia, as well as possible CVA. Will check labs, CT.  Labs show significant hyponatremia, which is new and may explain some of his sx. Otherwise no significant/acute changes. CT head neg. Will obtain MRI given persistent AMS, admit to hospitalist.  Final Clinical Impressions(s) / ED Diagnoses   Final diagnoses:  Delirium  Tremor  Transient alteration of awareness    New Prescriptions New Prescriptions   No medications on file     Shaune Pollackameron Leilana Mcquire, MD 2016-05-26 1730

## 2016-06-07 NOTE — ED Notes (Signed)
Spoke with primary nurse for room 1529 about the order for safety sitter. Primary nurse [facility] if [facility] Joe RN. Spoke with Gabriel RungJoe, RN who stated they didn't have a sitter available but he reported the floor would need to use the tele-monitoring. Spoke back the primary nurse, she reported to bring patient up in 20 minutes.

## 2016-06-07 NOTE — ED Notes (Signed)
Pt resting quietly on his left side with eyes closed. Observed rise and fall of chest. Skin is warm and dry.

## 2016-06-08 DIAGNOSIS — I959 Hypotension, unspecified: Secondary | ICD-10-CM | POA: Diagnosis present

## 2016-06-08 DIAGNOSIS — R451 Restlessness and agitation: Secondary | ICD-10-CM | POA: Diagnosis present

## 2016-06-08 DIAGNOSIS — Z66 Do not resuscitate: Secondary | ICD-10-CM | POA: Diagnosis present

## 2016-06-08 DIAGNOSIS — J9601 Acute respiratory failure with hypoxia: Secondary | ICD-10-CM | POA: Diagnosis not present

## 2016-06-08 DIAGNOSIS — F79 Unspecified intellectual disabilities: Secondary | ICD-10-CM | POA: Diagnosis present

## 2016-06-08 DIAGNOSIS — G9340 Encephalopathy, unspecified: Secondary | ICD-10-CM | POA: Diagnosis present

## 2016-06-08 DIAGNOSIS — F200 Paranoid schizophrenia: Secondary | ICD-10-CM | POA: Diagnosis present

## 2016-06-08 DIAGNOSIS — D696 Thrombocytopenia, unspecified: Secondary | ICD-10-CM | POA: Diagnosis present

## 2016-06-08 DIAGNOSIS — N179 Acute kidney failure, unspecified: Secondary | ICD-10-CM | POA: Diagnosis present

## 2016-06-08 DIAGNOSIS — W19XXXA Unspecified fall, initial encounter: Secondary | ICD-10-CM | POA: Diagnosis present

## 2016-06-08 DIAGNOSIS — Z01818 Encounter for other preprocedural examination: Secondary | ICD-10-CM | POA: Diagnosis not present

## 2016-06-08 DIAGNOSIS — Z515 Encounter for palliative care: Secondary | ICD-10-CM | POA: Diagnosis not present

## 2016-06-08 DIAGNOSIS — R1310 Dysphagia, unspecified: Secondary | ICD-10-CM | POA: Diagnosis present

## 2016-06-08 DIAGNOSIS — A419 Sepsis, unspecified organism: Secondary | ICD-10-CM | POA: Diagnosis present

## 2016-06-08 DIAGNOSIS — J189 Pneumonia, unspecified organism: Secondary | ICD-10-CM | POA: Diagnosis not present

## 2016-06-08 DIAGNOSIS — R404 Transient alteration of awareness: Secondary | ICD-10-CM | POA: Diagnosis present

## 2016-06-08 DIAGNOSIS — E039 Hypothyroidism, unspecified: Secondary | ICD-10-CM | POA: Diagnosis present

## 2016-06-08 DIAGNOSIS — Z4659 Encounter for fitting and adjustment of other gastrointestinal appliance and device: Secondary | ICD-10-CM | POA: Diagnosis not present

## 2016-06-08 DIAGNOSIS — B370 Candidal stomatitis: Secondary | ICD-10-CM | POA: Diagnosis present

## 2016-06-08 DIAGNOSIS — I10 Essential (primary) hypertension: Secondary | ICD-10-CM | POA: Diagnosis present

## 2016-06-08 DIAGNOSIS — Z7189 Other specified counseling: Secondary | ICD-10-CM | POA: Diagnosis not present

## 2016-06-08 DIAGNOSIS — F6381 Intermittent explosive disorder: Secondary | ICD-10-CM | POA: Diagnosis present

## 2016-06-08 DIAGNOSIS — E871 Hypo-osmolality and hyponatremia: Secondary | ICD-10-CM | POA: Diagnosis present

## 2016-06-08 DIAGNOSIS — J9621 Acute and chronic respiratory failure with hypoxia: Secondary | ICD-10-CM | POA: Diagnosis present

## 2016-06-08 DIAGNOSIS — R569 Unspecified convulsions: Secondary | ICD-10-CM | POA: Diagnosis not present

## 2016-06-08 DIAGNOSIS — R34 Anuria and oliguria: Secondary | ICD-10-CM | POA: Diagnosis present

## 2016-06-08 DIAGNOSIS — R410 Disorientation, unspecified: Secondary | ICD-10-CM | POA: Diagnosis not present

## 2016-06-08 DIAGNOSIS — F0390 Unspecified dementia without behavioral disturbance: Secondary | ICD-10-CM | POA: Diagnosis present

## 2016-06-08 DIAGNOSIS — J9620 Acute and chronic respiratory failure, unspecified whether with hypoxia or hypercapnia: Secondary | ICD-10-CM | POA: Diagnosis not present

## 2016-06-08 DIAGNOSIS — R6521 Severe sepsis with septic shock: Secondary | ICD-10-CM | POA: Diagnosis present

## 2016-06-08 DIAGNOSIS — J690 Pneumonitis due to inhalation of food and vomit: Secondary | ICD-10-CM | POA: Diagnosis present

## 2016-06-08 DIAGNOSIS — R296 Repeated falls: Secondary | ICD-10-CM | POA: Diagnosis present

## 2016-06-08 DIAGNOSIS — E038 Other specified hypothyroidism: Secondary | ICD-10-CM | POA: Diagnosis not present

## 2016-06-08 DIAGNOSIS — F411 Generalized anxiety disorder: Secondary | ICD-10-CM | POA: Diagnosis present

## 2016-06-08 DIAGNOSIS — R4589 Other symptoms and signs involving emotional state: Secondary | ICD-10-CM | POA: Diagnosis not present

## 2016-06-08 LAB — BASIC METABOLIC PANEL
Anion gap: 6 (ref 5–15)
BUN: 7 mg/dL (ref 6–20)
CALCIUM: 9 mg/dL (ref 8.9–10.3)
CHLORIDE: 91 mmol/L — AB (ref 101–111)
CO2: 31 mmol/L (ref 22–32)
CREATININE: 0.68 mg/dL (ref 0.61–1.24)
GFR calc non Af Amer: >60 mL/min (ref >60)
Glucose, Bld: 91 mg/dL (ref 65–99)
Potassium: 4.2 mmol/L (ref 3.5–5.1)
SODIUM: 128 mmol/L — AB (ref 135–145)

## 2016-06-08 LAB — SODIUM, URINE, RANDOM: Sodium, Ur: 119 mmol/L

## 2016-06-08 LAB — CBC
HCT: 31.8 % — ABNORMAL LOW (ref 39.0–52.0)
Hemoglobin: 10.8 g/dL — ABNORMAL LOW (ref 13.0–17.0)
MCH: 27.6 pg (ref 26.0–34.0)
MCHC: 34 g/dL (ref 30.0–36.0)
MCV: 81.1 fL (ref 78.0–100.0)
PLATELETS: 73 10*3/uL — AB (ref 150–400)
RBC: 3.92 MIL/uL — AB (ref 4.22–5.81)
RDW: 15.2 % (ref 11.5–15.5)
WBC: 6.9 10*3/uL (ref 4.0–10.5)

## 2016-06-08 LAB — SODIUM
SODIUM: 129 mmol/L — AB (ref 135–145)
SODIUM: 130 mmol/L — AB (ref 135–145)

## 2016-06-08 LAB — OSMOLALITY, URINE: OSMOLALITY UR: 458 mosm/kg (ref 300–900)

## 2016-06-08 MED ORDER — ACETAMINOPHEN 325 MG PO TABS
650.0000 mg | ORAL_TABLET | Freq: Four times a day (QID) | ORAL | Status: DC | PRN
  Administered 2016-06-08 – 2016-06-18 (×11): 650 mg via ORAL
  Filled 2016-06-08 (×13): qty 2

## 2016-06-08 MED ORDER — SODIUM CHLORIDE 0.9 % IV SOLN
INTRAVENOUS | Status: DC
  Administered 2016-06-08 – 2016-06-14 (×11): via INTRAVENOUS

## 2016-06-08 MED ORDER — ASPIRIN 81 MG PO CHEW
81.0000 mg | CHEWABLE_TABLET | Freq: Every day | ORAL | Status: DC
  Administered 2016-06-08 – 2016-06-18 (×11): 81 mg via ORAL
  Filled 2016-06-08 (×11): qty 1

## 2016-06-08 NOTE — Progress Notes (Signed)
Pt has refused his medications this afternoon, several attempts were made for Mr. Jeff Foster to take his pills and he continues to spit them out.

## 2016-06-08 NOTE — Progress Notes (Signed)
Pt has temperature of 101.1 Dr. Butler Denmarkizwan notified, and ordered aspirin. Monitoring temperature as well as other signs such as diarrhea or vomiting. Evonne Rinks R McClean

## 2016-06-08 NOTE — Progress Notes (Addendum)
PROGRESS NOTE    Jeff Foster  ZOX:[id number] DOB: [birth year] DOA: 06-30-16  PCP: Diamantina Providence, FNP   Brief Narrative:  Jeff Foster is a 55 y.o. male with medical history significant of MR, intermittent explosive behavior, psychosis, seizure disorder, CVA,dysphagia, hypothyroidism, anemia, and thrombocytopenia; who presents with tremors and falls. History is obtained via report as patient has significant meantal retardation unable to give his own history. Patient was noted over last 24 hours to have been coming more drowsy confused and unable to walk even with a walker. No report of any seizure-like activity was noted. Patient has had this happen previously and was related to either do to infection dehydration or medication changes. There was reports of dysuria and frequency.  Subjective: Agitated. Cannot give history or do ROS.   Assessment & Plan:   Principal Problem:   Encephalopathy acute- tremors, falls- h/o   Aggressive behavior &  Mental retardation - apparently has been drowsy and apparently "more" confused, has had falls and a tremor at group home which is out of the ordinary- according to notes in EPIC, he apparently becomes this way when he has an infection-  - currently not lethargic but exteremly agitated and fighting nursing staff- threw IV pole, pulled IV - per records, he has a history of this type of aggressive behavior and this is more the norm for him - on Depakote, Thorazine, neurontin, Hydroxyzine, Ativan, Remeron, Seroquel, Zoloft, Trazodone - MRI and CT head without acute issues  Fever - just called about temp of 101.1 - UA and CXR WNL yesterday - no source- hold off on starting antibiotics- obtain blood cx - Tylenol PRN- follow fever curve  Chronic lacunar infarcts on MRI - start baby aspirin  Hyponatremia - doubtful that drowsiness was due to sodium as sodium is essentially unchanged from admission and he is now very agitated. - fall may have been related  to hyponatremia  - SIADH from psych meds??- U studies would be ideal but he is incontinent and combative - cont IV NS and treat as dehydration for now and follow - can be discharged on sodium normalized- psych issues are chronic    Hypothyroidism - TSH normal - cont synthroid    Thrombocytopenia  - chronic    Seizure  - on Depakote and Trileptal for this?  - Depakote level normal  U retention - admitted for this in 9/17 and started on Flomax    DVT prophylaxis: Lovenox Code Status: DNR Family Communication:  Disposition Plan: return to group home Consultants:    Procedures:    Antimicrobials:  Anti-infectives    None       Objective: Vitals:   06-30-16 1621 June 30, 2016 2220 06/08/16 0500 06/08/16 0555  BP: 151/93 (!) 149/78  136/81  Pulse: 68 63  75  Resp: 18 18  18   Temp:  98.7 F (37.1 C)  98.6 F (37 C)  TempSrc:  Oral  Oral  SpO2: 95% 94%  92%  Weight:  100.2 kg (221 lb) 100.2 kg (221 lb)   Height:   6\' 2"  (1.88 m)     Intake/Output Summary (Last 24 hours) at 06/08/16 1303 Last data filed at 06/08/16 1000  Gross per 24 hour  Intake                0 ml  Output              750 ml  Net             -  750 ml   Filed Weights   03/18/2016 1028 03/18/2016 2220 06/08/16 0500  Weight: 91.6 kg (202 lb) 100.2 kg (221 lb) 100.2 kg (221 lb)    Examination: General exam: Appears uncomfortable- fighting nurses who are trying to change his gown HEENT: PERRLA, oral mucosa moist, no sclera icterus or thrush Respiratory system: Clear to auscultation. Respiratory effort normal. Cardiovascular system: S1 & S2 heard, RRR.  No murmurs  Gastrointestinal system: Abdomen soft, non-tender, nondistended. Normal bowel sound. No organomegaly Central nervous system: Alert No focal neurological deficits. Extremities: No cyanosis, clubbing or edema Skin: No rashes or ulcers Psychiatry:  agitated    Data Reviewed: I have personally reviewed following labs and imaging  studies  CBC:  Recent Labs Lab 03/18/2016 1129 06/08/16 0454  WBC 8.2 6.9  NEUTROABS 5.6  --   HGB 11.4* 10.8*  HCT 34.0* 31.8*  MCV 81.3 81.1  PLT 88* 73*   Basic Metabolic Panel:  Recent Labs Lab 03/18/2016 1129 06/08/16 0454  NA 129* 128*  K 4.4 4.2  CL 91* 91*  CO2 30 31  GLUCOSE 113* 91  BUN 7 7  CREATININE 0.72 0.68  CALCIUM 9.0 9.0   GFR: Estimated Creatinine Clearance: 130.4 mL/min (by C-G formula based on SCr of 0.68 mg/dL). Liver Function Tests: No results for input(s): AST, ALT, ALKPHOS, BILITOT, PROT, ALBUMIN in the last 168 hours. No results for input(s): LIPASE, AMYLASE in the last 168 hours. No results for input(s): AMMONIA in the last 168 hours. Coagulation Profile: No results for input(s): INR, PROTIME in the last 168 hours. Cardiac Enzymes: No results for input(s): CKTOTAL, CKMB, CKMBINDEX, TROPONINI in the last 168 hours. BNP (last 3 results) No results for input(s): PROBNP in the last 8760 hours. HbA1C: No results for input(s): HGBA1C in the last 72 hours. CBG: No results for input(s): GLUCAP in the last 168 hours. Lipid Profile: No results for input(s): CHOL, HDL, LDLCALC, TRIG, CHOLHDL, LDLDIRECT in the last 72 hours. Thyroid Function Tests:  Recent Labs  03/18/2016 1451  TSH 0.876   Anemia Panel: No results for input(s): VITAMINB12, FOLATE, FERRITIN, TIBC, IRON, RETICCTPCT in the last 72 hours. Urine analysis:    Component Value Date/Time   COLORURINE YELLOW October 24, 2015 1321   APPEARANCEUR CLEAR October 24, 2015 1321   LABSPEC 1.012 October 24, 2015 1321   PHURINE 7.0 October 24, 2015 1321   GLUCOSEU NEGATIVE October 24, 2015 1321   HGBUR SMALL (A) October 24, 2015 1321   BILIRUBINUR NEGATIVE October 24, 2015 1321   KETONESUR NEGATIVE October 24, 2015 1321   PROTEINUR NEGATIVE October 24, 2015 1321   NITRITE NEGATIVE October 24, 2015 1321   LEUKOCYTESUR NEGATIVE October 24, 2015 1321   Sepsis Labs: @LABRCNTIP (procalcitonin:4,lacticidven:4) )No results found for this or any previous  visit (from the past 240 hour(s)).       Radiology Studies: Dg Chest 2 View  Result Date: 06/25/2016 CLINICAL DATA:  Altered mental status. EXAM: CHEST  2 VIEW COMPARISON:  Radiographs of March 04, 2016. FINDINGS: The heart size and mediastinal contours are within normal limits. Both lungs are clear. No pneumothorax or pleural effusion is noted. The visualized skeletal structures are unremarkable. IMPRESSION: No active cardiopulmonary disease. Electronically Signed   By: Lupita RaiderJames  Green Jr, M.D.   On: October 24, 2015 13:14   Ct Head Wo Contrast  Result Date: 06/29/2016 CLINICAL DATA:  Altered mental status with decreasing balance in a fall 2 days ago. EXAM: CT HEAD WITHOUT CONTRAST TECHNIQUE: Contiguous axial images were obtained from the base of the skull through the vertex without intravenous contrast. COMPARISON:  04/14/2016 FINDINGS:  Brain: There is no evidence for acute hemorrhage, hydrocephalus, mass lesion, or abnormal extra-axial fluid collection. No definite CT evidence for acute infarction. Diffuse loss of parenchymal volume is consistent with atrophy. Patchy low attenuation in the deep hemispheric and periventricular white matter is nonspecific, but likely reflects chronic microvascular ischemic demyelination. Vascular: No hyperdense vessel or unexpected calcification. Skull: No evidence for fracture. No worrisome lytic or sclerotic lesion. Sinuses/Orbits: The visualized paranasal sinuses and mastoid air cells are clear. Visualized portions of the globes and intraorbital fat are unremarkable. Other: None. IMPRESSION: Stable.  No acute intracranial abnormality. Atrophy with chronic small vessel white matter ischemic disease. Electronically Signed   By: Kennith CenterEric  Mansell M.D.   On: 08-04-2015 13:28   Mr Brain Wo Contrast  Result Date: 07/04/2016 CLINICAL DATA:  55 y/o M; history of increasing drowsiness, confusion, and inability to walk. History of stroke, seizures, and schizophrenia appear EXAM: MRI  HEAD WITHOUT CONTRAST TECHNIQUE: Multiplanar, multiecho pulse sequences of the brain and surrounding structures were obtained without intravenous contrast. COMPARISON:  08-04-2015 CT head.  10/27/2015 MRI head. FINDINGS: Brain: Motion degraded T2 FLAIR sequence and inversion recovery sequence. Axial T2 weighted sequence demonstrate small foci of T2 hyperintensity in the bilateral lentiform nuclei and left anterior corona radiata compatible with chronic lacunar infarcts and/or perivascular spaces. No diffusion signal abnormality. No abnormal susceptibility hypointensity to indicate intracranial hemorrhage. No focal mass effect. Moderate brain parenchymal volume loss is stable. Stable ventricle size. Vascular: Normal flow voids. Skull and upper cervical spine: Normal marrow signal. Sinuses/Orbits: Mild diffuse paranasal sinus mucosal thickening and partial opacification of anterior ethmoid air cells. No abnormal signal of mastoid air cells. Orbits are unremarkable. Other: None. IMPRESSION: 1. Motion degraded study. 2. No evidence of acute infarct, focal mass effect, or intracranial hemorrhage. 3. Stable moderate brain parenchymal volume loss and chronic basal ganglia lacunar infarcts. 4. Mild paranasal sinus disease greatest in anterior ethmoid air cells. Electronically Signed   By: Mitzi HansenLance  Furusawa-Stratton M.D.   On: 08-04-2015 19:03      Scheduled Meds: . sodium chloride   Intravenous Once  . atorvastatin  40 mg Oral QHS  . chlorproMAZINE  50 mg Oral TID  . divalproex  1,000 mg Oral Q breakfast  . enoxaparin (LOVENOX) injection  40 mg Subcutaneous QHS  . gabapentin  300 mg Oral TID  . levothyroxine  25 mcg Oral QAC breakfast  . LORazepam  1 mg Oral BID  . mirtazapine  15 mg Oral QHS  . oxcarbazepine  600 mg Oral BID  . pantoprazole  40 mg Oral Daily  . QUEtiapine  200 mg Oral QHS  . sertraline  100 mg Oral Daily  . tamsulosin  0.4 mg Oral QHS  . trazodone  300 mg Oral QHS   Continuous  Infusions: . sodium chloride 500 mL/hr at 06/08/16 1217     LOS: 1 day    Time spent in minutes: 35    [place]oka Stringfield, MD [facility] Pager: [email or web address] Password [email or web address] 06/08/2016, 1:03 PM

## 2016-06-09 ENCOUNTER — Inpatient Hospital Stay (HOSPITAL_COMMUNITY): Payer: Medicare Other

## 2016-06-09 LAB — BASIC METABOLIC PANEL
Anion gap: 6 (ref 5–15)
BUN: 10 mg/dL (ref 6–20)
CO2: 32 mmol/L (ref 22–32)
Calcium: 9 mg/dL (ref 8.9–10.3)
Chloride: 95 mmol/L — ABNORMAL LOW (ref 101–111)
Creatinine, Ser: 0.8 mg/dL (ref 0.61–1.24)
GFR calc Af Amer: >60 mL/min (ref >60)
GLUCOSE: 95 mg/dL (ref 65–99)
POTASSIUM: 4.5 mmol/L (ref 3.5–5.1)
Sodium: 133 mmol/L — ABNORMAL LOW (ref 135–145)

## 2016-06-09 LAB — CBC
HEMATOCRIT: 32 % — AB (ref 39.0–52.0)
Hemoglobin: 10.8 g/dL — ABNORMAL LOW (ref 13.0–17.0)
MCH: 27.5 pg (ref 26.0–34.0)
MCHC: 33.8 g/dL (ref 30.0–36.0)
MCV: 81.4 fL (ref 78.0–100.0)
Platelets: 61 10*3/uL — ABNORMAL LOW (ref 150–400)
RBC: 3.93 MIL/uL — ABNORMAL LOW (ref 4.22–5.81)
RDW: 15.1 % (ref 11.5–15.5)
WBC: 8.5 10*3/uL (ref 4.0–10.5)

## 2016-06-09 LAB — SODIUM
Sodium: 129 mmol/L — ABNORMAL LOW (ref 135–145)
Sodium: 134 mmol/L — ABNORMAL LOW (ref 135–145)

## 2016-06-09 MED ORDER — QUETIAPINE FUMARATE 25 MG PO TABS: 200.0000 mg | ORAL_TABLET | Freq: Every day | ORAL | Status: DC

## 2016-06-09 MED ORDER — TRAZODONE HCL 50 MG PO TABS: 150.0000 mg | ORAL_TABLET | Freq: Every day | ORAL | Status: DC

## 2016-06-09 MED ORDER — QUETIAPINE FUMARATE 200 MG PO TABS
200.0000 mg | ORAL_TABLET | Freq: Every day | ORAL | Status: DC
  Administered 2016-06-09 – 2016-06-18 (×9): 200 mg via ORAL
  Filled 2016-06-09: qty 2
  Filled 2016-06-09 (×2): qty 1
  Filled 2016-06-09 (×3): qty 2
  Filled 2016-06-09 (×4): qty 1
  Filled 2016-06-09: qty 2
  Filled 2016-06-09 (×2): qty 1
  Filled 2016-06-09: qty 2
  Filled 2016-06-09: qty 1
  Filled 2016-06-09: qty 2
  Filled 2016-06-09: qty 1
  Filled 2016-06-09 (×2): qty 2

## 2016-06-09 MED ORDER — HALOPERIDOL LACTATE 5 MG/ML IJ SOLN
INTRAMUSCULAR | Status: AC
Start: 2016-06-09 — End: 2016-06-09
  Administered 2016-06-09: 5 mg
  Filled 2016-06-09: qty 1

## 2016-06-09 MED ORDER — GUAIFENESIN ER 600 MG PO TB12
1200.0000 mg | ORAL_TABLET | Freq: Two times a day (BID) | ORAL | Status: DC
  Administered 2016-06-09 – 2016-06-10 (×2): 1200 mg via ORAL
  Filled 2016-06-09 (×3): qty 2

## 2016-06-09 MED ORDER — CHLORPROMAZINE HCL 25 MG/ML IJ SOLN
25.0000 mg | Freq: Once | INTRAMUSCULAR | Status: AC
Start: 2016-06-09 — End: 2016-06-09
  Administered 2016-06-09: 25 mg via INTRAMUSCULAR
  Filled 2016-06-09: qty 1

## 2016-06-09 MED ORDER — PIPERACILLIN-TAZOBACTAM 3.375 G IVPB
3.3750 g | Freq: Three times a day (TID) | INTRAVENOUS | Status: AC
Start: 2016-06-09 — End: 2016-06-15
  Administered 2016-06-09 – 2016-06-15 (×19): 3.375 g via INTRAVENOUS
  Filled 2016-06-09 (×21): qty 50

## 2016-06-09 MED ORDER — LORAZEPAM 2 MG/ML IJ SOLN
2.0000 mg | Freq: Four times a day (QID) | INTRAMUSCULAR | Status: DC | PRN
  Administered 2016-06-10 (×2): 2 mg via INTRAVENOUS
  Filled 2016-06-09 (×2): qty 1

## 2016-06-09 MED ORDER — PIPERACILLIN-TAZOBACTAM 3.375 G IVPB 30 MIN
3.3750 g | Freq: Once | INTRAVENOUS | Status: AC
  Administered 2016-06-09: 3.375 g via INTRAVENOUS
  Filled 2016-06-09: qty 50

## 2016-06-09 MED ORDER — IPRATROPIUM-ALBUTEROL 0.5-2.5 (3) MG/3ML IN SOLN
3.0000 mL | Freq: Three times a day (TID) | RESPIRATORY_TRACT | Status: DC
  Administered 2016-06-10 – 2016-06-11 (×4): 3 mL via RESPIRATORY_TRACT
  Filled 2016-06-09 (×4): qty 3

## 2016-06-09 MED ORDER — HALOPERIDOL LACTATE 5 MG/ML IJ SOLN: 5.0000 mg | Freq: Once | INTRAMUSCULAR | Status: DC

## 2016-06-09 MED ORDER — HALOPERIDOL LACTATE 5 MG/ML IJ SOLN: 5.0000 mg | Freq: Once | INTRAMUSCULAR | Status: AC

## 2016-06-09 MED ORDER — FLUCONAZOLE IN SODIUM CHLORIDE 200-0.9 MG/100ML-% IV SOLN
200.0000 mg | INTRAVENOUS | Status: DC
  Administered 2016-06-09 – 2016-06-13 (×5): 200 mg via INTRAVENOUS
  Filled 2016-06-09 (×8): qty 100

## 2016-06-09 MED ORDER — QUETIAPINE FUMARATE 25 MG PO TABS: 100.0000 mg | ORAL_TABLET | Freq: Every day | ORAL | Status: DC

## 2016-06-09 MED ORDER — IPRATROPIUM-ALBUTEROL 0.5-2.5 (3) MG/3ML IN SOLN
3.0000 mL | Freq: Four times a day (QID) | RESPIRATORY_TRACT | Status: DC
  Administered 2016-06-09 (×2): 3 mL via RESPIRATORY_TRACT
  Filled 2016-06-09 (×3): qty 3

## 2016-06-09 MED ORDER — TRAZODONE HCL 50 MG PO TABS
300.0000 mg | ORAL_TABLET | Freq: Every day | ORAL | Status: DC
  Administered 2016-06-09 – 2016-06-18 (×9): 300 mg via ORAL
  Filled 2016-06-09 (×10): qty 6

## 2016-06-09 NOTE — Progress Notes (Signed)
Pharmacy Antibiotic Note  Jeff Foster is a 55 y.o. male with PMH of MR, intermittent explosive behavior, psychosis, seizure disorder, CVA, dysphagia, hypothyroidism, anemia, and thrombocytopenia; admitted on 06/06/2016 with possible aspiration pneumonia.  Pharmacy has been consulted for Zosyn dosing.  Plan:  Zosyn 3.375 g IV given once over 30 minutes, then every 8 hrs by 4-hr infusion  Follow clinical course, renal function, culture results as available  Follow for de-escalation of antibiotics and LOT   Height: 6\' 2"  (188 cm) Weight: 221 lb (100.2 kg) IBW/kg (Calculated) : 82.2  Temp (24hrs), Avg:98.9 F (37.2 C), Min:98.4 F (36.9 C), Max:100.2 F (37.9 C)   Recent Labs Lab 06/13/2016 1129 06/13/2016 1150 06/08/16 0454 06/09/16 0634  WBC 8.2  --  6.9 8.5  CREATININE 0.72  --  0.68 0.80  LATICACIDVEN  --  1.43  --   --     Estimated Creatinine Clearance: 130.4 mL/min (by C-G formula based on SCr of 0.8 mg/dL).    Allergies  Allergen Reactions  . Other Other (See Comments)    Pt is allergic to chlorine and bleach.   Reaction:  Unknown   . Peanut-Containing Drug Products Diarrhea    Antimicrobials this admission: Zosyn 12/6 >>  Diflucan (per MD for thrush) 12/6 >>   Dose adjustments this admission: ---  Microbiology results: 12/5 BCx: sent  Thank you for allowing pharmacy to be a part of this patient's care.  Bernadene Personrew Dali Kraner, PharmD, BCPS Pager: [phone] 06/09/2016, 3:22 PM

## 2016-06-09 NOTE — Progress Notes (Signed)
Provider made aware of MEWS scale. No new labs. Patient to be moved to stepdown for further care.  Bed request made.

## 2016-06-09 NOTE — Progress Notes (Signed)
Pt becoming more combative and trying to get out of bed.  Engineer, agriculturalitter nurse and security in the room.  Dr Randol KernElgergawy paged and made aware.  Patient order for restraints and IM Thorazine. 1905 IM Thorazine given patient in restraints, sitter continues in room with patient.  House supervisor Clista BernhardtJoe Perez made aware of the above.  Await ICU bed.  Unable to place IV line at this time

## 2016-06-09 NOTE — Progress Notes (Signed)
eLink Physician-Brief Progress Note Patient Name: Jeff ChurnJohn Foster DOB: 15-Aug-1960 MRN: [id number]   Date of Service  06/09/2016  HPI/Events of Note  Pain  eICU Interventions  Percocet     Intervention Category Major Interventions: Other:  Larson Limones 06/09/2016, 8:26 PM

## 2016-06-09 NOTE — Progress Notes (Signed)
Patient is febrile 102.4 this p.m., chest x-ray with right lung capacity, is likely related to aspiration pneumonia from dysphagia, will start on IV Zosyn. -  Patient is extremely combative, pulled his IV access, received IM Haldol, will give evening dose of Seroquel now, will transfer to stepdown as if no improvement may need Precedex drip. - Tried to call patient legal gaurdian [name] [phone], to update her, with her voicemail. Jake Batheawood Elgeragwy MD

## 2016-06-09 NOTE — Progress Notes (Addendum)
PROGRESS NOTE    Jeff ChurnJohn Foster  AVW:[id number] DOB: Apr 22, 1961 DOA: 07/03/2016  PCP: Diamantina Providenceakela N Anderson, FNP   Brief Narrative:  Jeff Foster is a 55 y.o. male with medical history significant of MR, intermittent explosive behavior, psychosis, seizure disorder, CVA,dysphagia, hypothyroidism, anemia, and thrombocytopenia; who presents with tremors and falls. History is obtained via report as patient has significant meantal retardation unable to give his own history. Patient was noted over last 24 hours to have been coming more drowsy confused and unable to walk even with a walker. No report of any seizure-like activity was noted. Patient has had this happen previously and was related to either do to infection dehydration or medication changes. There was reports of dysuria and frequency.  Subjective: Agitated. Cannot give history or do ROS.   Assessment & Plan:      Encephalopathy acute- tremors, falls- h/o   Aggressive behavior &  Mental retardation - apparently has been drowsy and apparently "more" confused, has had falls and a tremor at group home which is out of the ordinary- according to notes in EPIC, he apparently becomes this way when he has an infection-  - currently not lethargic but exteremly agitated and fighting nursing staff- threw IV pole, pulled IV - per records, he has a history of this type of aggressive behavior and this is more the norm for him - on Depakote, Thorazine, neurontin, Hydroxyzine, Ativan, Remeron, Seroquel, Zoloft, Trazodone - MRI and CT head without acute issues  Fever - Fever of 101.1 yesterday - UA and CXR WNL on admission - no source- hold off on starting antibiotics-follow on blood cx - Shins with known history of chronic dysphagia, possibly contributing to fever - Tylenol PRN- follow fever curve Addendum: Patient hypoxic 85% on room air, will start on oxygen, will check stat chest x-ray, will start empirically on Zosyn for possible aspiration pneumonia, will  start on Mucinex, flutter valve and chest PT   Oral thrush - We'll start on Diflucan  Chronic lacunar infarcts on MRI - start baby aspirin  Hyponatremia - doubtful that drowsiness was due to sodium as sodium is essentially unchanged from admission and he is now very agitated. - SIADH from psych meds??- U studies would be ideal but he is incontinent and combative - cont IV NS and treat as dehydration for now and follow - can be discharged on sodium normalized- psych issues are chronic    Hypothyroidism - TSH normal - cont synthroid    Thrombocytopenia  - chronic, SCd fr DVT prox.    Seizure  - on Depakote and Trileptal for this?  - Depakote level normal  U retention - admitted for this in 9/17 and started on Flomax    DVT prophylaxis: SCD Code Status: Full code Family Communication: Tried to call legal guardian, group home owner, caregiver, sister, left him messages but no one has called back To discuss them baseline, goals of care and CODE STATUS (has been DO NOT RESUSCITATE at one point in the past, but has been full code on most recent admission . Disposition Plan: return to group home Consultants:    Procedures:    Antimicrobials:  Anti-infectives    Start     Dose/Rate Route Frequency Ordered Stop   06/09/16 1430  fluconazole (DIFLUCAN) IVPB 200 mg     200 mg 100 mL/hr over 60 Minutes Intravenous Every 24 hours 06/09/16 1429         Objective: Vitals:   06/08/16 1620 06/08/16 2127 06/09/16  0530 06/09/16 0940  BP:  133/78 (!) 161/83   Pulse:  93 89   Resp:  (!) 22 (!) 22   Temp: 98.7 F (37.1 C) 100.2 F (37.9 C) 98.4 F (36.9 C) 98.6 F (37 C)  TempSrc: Axillary Oral Oral   SpO2:  97% 90%   Weight:      Height:        Intake/Output Summary (Last 24 hours) at 06/09/16 1433 Last data filed at 06/09/16 1000  Gross per 24 hour  Intake             1790 ml  Output              800 ml  Net              990 ml   Filed Weights   2016/06/08 1028  06-08-16 2220 06/08/16 0500  Weight: 91.6 kg (202 lb) 100.2 kg (221 lb) 100.2 kg (221 lb)    Examination: General exam: Sleeping comfortably HEENT: PERRLA, oral mucosa moist, mild oral thrush Respiratory system: Clear to auscultation. Respiratory effort normal. Cardiovascular system: S1 & S2 heard, RRR.  No murmurs  Gastrointestinal system: Abdomen soft, non-tender, nondistended. Normal bowel sound. No organomegaly Central nervous system: Alert No focal neurological deficits. Extremities: No cyanosis, clubbing or edema Skin: No rashes or ulcers Psychiatry:  agitated    Data Reviewed: I have personally reviewed following labs and imaging studies  CBC:  Recent Labs Lab Jun 08, 2016 1129 06/08/16 0454 06/09/16 0634  WBC 8.2 6.9 8.5  NEUTROABS 5.6  --   --   HGB 11.4* 10.8* 10.8*  HCT 34.0* 31.8* 32.0*  MCV 81.3 81.1 81.4  PLT 88* 73* 61*   Basic Metabolic Panel:  Recent Labs Lab 08-Jun-2016 1129 06/08/16 0454 06/08/16 1505 06/08/16 2249 06/09/16 0634  NA 129* 128* 129* 130* 133*  K 4.4 4.2  --   --  4.5  CL 91* 91*  --   --  95*  CO2 30 31  --   --  32  GLUCOSE 113* 91  --   --  95  BUN 7 7  --   --  10  CREATININE 0.72 0.68  --   --  0.80  CALCIUM 9.0 9.0  --   --  9.0   GFR: Estimated Creatinine Clearance: 130.4 mL/min (by C-G formula based on SCr of 0.8 mg/dL). Liver Function Tests: No results for input(s): AST, ALT, ALKPHOS, BILITOT, PROT, ALBUMIN in the last 168 hours. No results for input(s): LIPASE, AMYLASE in the last 168 hours. No results for input(s): AMMONIA in the last 168 hours. Coagulation Profile: No results for input(s): INR, PROTIME in the last 168 hours. Cardiac Enzymes: No results for input(s): CKTOTAL, CKMB, CKMBINDEX, TROPONINI in the last 168 hours. BNP (last 3 results) No results for input(s): PROBNP in the last 8760 hours. HbA1C: No results for input(s): HGBA1C in the last 72 hours. CBG: No results for input(s): GLUCAP in the last 168  hours. Lipid Profile: No results for input(s): CHOL, HDL, LDLCALC, TRIG, CHOLHDL, LDLDIRECT in the last 72 hours. Thyroid Function Tests:  Recent Labs  2016-06-08 1451  TSH 0.876   Anemia Panel: No results for input(s): VITAMINB12, FOLATE, FERRITIN, TIBC, IRON, RETICCTPCT in the last 72 hours. Urine analysis:    Component Value Date/Time   COLORURINE YELLOW 06/08/16 1321   APPEARANCEUR CLEAR Jun 08, 2016 1321   LABSPEC 1.012 08-Jun-2016 1321   PHURINE 7.0 2016-06-08 1321   GLUCOSEU  NEGATIVE 06/16/2016 1321   HGBUR SMALL (A) 07/04/2016 1321   BILIRUBINUR NEGATIVE 06/12/2016 1321   KETONESUR NEGATIVE 06/27/2016 1321   PROTEINUR NEGATIVE 06/06/2016 1321   NITRITE NEGATIVE 06/13/2016 1321   LEUKOCYTESUR NEGATIVE 06/04/2016 1321   Sepsis Labs: @LABRCNTIP (procalcitonin:4,lacticidven:4) )No results found for this or any previous visit (from the past 240 hour(s)).       Radiology Studies: Mr Brain 64Wo Contrast  Result Date: 06/23/2016 CLINICAL DATA:  55 y/o M; history of increasing drowsiness, confusion, and inability to walk. History of stroke, seizures, and schizophrenia appear EXAM: MRI HEAD WITHOUT CONTRAST TECHNIQUE: Multiplanar, multiecho pulse sequences of the brain and surrounding structures were obtained without intravenous contrast. COMPARISON:  06/17/2016 CT head.  10/27/2015 MRI head. FINDINGS: Brain: Motion degraded T2 FLAIR sequence and inversion recovery sequence. Axial T2 weighted sequence demonstrate small foci of T2 hyperintensity in the bilateral lentiform nuclei and left anterior corona radiata compatible with chronic lacunar infarcts and/or perivascular spaces. No diffusion signal abnormality. No abnormal susceptibility hypointensity to indicate intracranial hemorrhage. No focal mass effect. Moderate brain parenchymal volume loss is stable. Stable ventricle size. Vascular: Normal flow voids. Skull and upper cervical spine: Normal marrow signal. Sinuses/Orbits: Mild  diffuse paranasal sinus mucosal thickening and partial opacification of anterior ethmoid air cells. No abnormal signal of mastoid air cells. Orbits are unremarkable. Other: None. IMPRESSION: 1. Motion degraded study. 2. No evidence of acute infarct, focal mass effect, or intracranial hemorrhage. 3. Stable moderate brain parenchymal volume loss and chronic basal ganglia lacunar infarcts. 4. Mild paranasal sinus disease greatest in anterior ethmoid air cells. Electronically Signed   By: Mitzi HansenLance  Furusawa-Stratton M.D.   On: 06/30/2016 19:03      Scheduled Meds: . sodium chloride   Intravenous Once  . aspirin  81 mg Oral Daily  . atorvastatin  40 mg Oral QHS  . chlorproMAZINE  50 mg Oral TID  . divalproex  1,000 mg Oral Q breakfast  . fluconazole (DIFLUCAN) IV  200 mg Intravenous Q24H  . gabapentin  300 mg Oral TID  . levothyroxine  25 mcg Oral QAC breakfast  . LORazepam  1 mg Oral BID  . mirtazapine  15 mg Oral QHS  . oxcarbazepine  600 mg Oral BID  . pantoprazole  40 mg Oral Daily  . QUEtiapine  200 mg Oral QHS  . sertraline  100 mg Oral Daily  . tamsulosin  0.4 mg Oral QHS  . trazodone  300 mg Oral QHS   Continuous Infusions: . sodium chloride 100 mL/hr at 06/09/16 1353     LOS: 2 days     Huey BienenstockELGERGAWY, Kamori Kitchens, MD Pager [phone] [facility] Pager: [email or web address] Password [email or web address] 06/09/2016, 2:33 PM

## 2016-06-09 NOTE — Clinical Social Work Note (Signed)
Patient admitted from [facility].   MSW spoke with patient's legal guardian, [name] [phone] in regards to discharge planning. Patient's legal guardian reported that she would notify [name] (group home owner), who MSW also attempted to contact and left a message for a returned phone call.   Pt's legal guardian asked that MSW contact patient's caregiver, Dia Sittereresa Williams at [phone] once patient is medically stable for discharge back to group home.   MSW discussed case with MD, who reported that patient would likely discharge on tomorrow, 12/6 instead of today due to medical reasoning.   PT evaluation also pending.   MSW remains available as needed.   Derenda FennelBashira Lavert Matousek, MSW [phone] 06/09/2016 2:25 PM

## 2016-06-09 NOTE — Progress Notes (Signed)
PT cannot utilize Flutter- has PC at this time per RN.

## 2016-06-09 NOTE — Progress Notes (Signed)
Pt restless, combative.  Temperature taken 102.4.  Dr. Rene PaciElgergwqy paged to come see patient.

## 2016-06-09 NOTE — Progress Notes (Signed)
Report called to Ezequiel KayserPaige Beck ICU RN. Questions answered.

## 2016-06-10 DIAGNOSIS — J690 Pneumonitis due to inhalation of food and vomit: Secondary | ICD-10-CM

## 2016-06-10 DIAGNOSIS — G9340 Encephalopathy, unspecified: Secondary | ICD-10-CM

## 2016-06-10 LAB — COMPREHENSIVE METABOLIC PANEL
ALT: 18 U/L (ref 17–63)
AST: 80 U/L — AB (ref 15–41)
Albumin: 3.3 g/dL — ABNORMAL LOW (ref 3.5–5.0)
Alkaline Phosphatase: 69 U/L (ref 38–126)
Anion gap: 7 (ref 5–15)
BILIRUBIN TOTAL: 0.9 mg/dL (ref 0.3–1.2)
BUN: 14 mg/dL (ref 6–20)
CALCIUM: 8.7 mg/dL — AB (ref 8.9–10.3)
CO2: 30 mmol/L (ref 22–32)
CREATININE: 0.83 mg/dL (ref 0.61–1.24)
Chloride: 97 mmol/L — ABNORMAL LOW (ref 101–111)
Glucose, Bld: 128 mg/dL — ABNORMAL HIGH (ref 65–99)
Potassium: 4.1 mmol/L (ref 3.5–5.1)
Sodium: 134 mmol/L — ABNORMAL LOW (ref 135–145)
TOTAL PROTEIN: 6.7 g/dL (ref 6.5–8.1)

## 2016-06-10 LAB — MRSA PCR SCREENING: MRSA by PCR: NEGATIVE

## 2016-06-10 LAB — BLOOD GAS, ARTERIAL
Acid-Base Excess: 4.6 mmol/L — ABNORMAL HIGH (ref 0.0–2.0)
BICARBONATE: 30.5 mmol/L — AB (ref 20.0–28.0)
Drawn by: 11249
FIO2: 100
O2 SAT: 88.7 %
PCO2 ART: 59.6 mmHg — AB (ref 32.0–48.0)
PO2 ART: 70.6 mmHg — AB (ref 83.0–108.0)
Patient temperature: 101.8
pH, Arterial: 7.339 — ABNORMAL LOW (ref 7.350–7.450)

## 2016-06-10 LAB — CBC WITH DIFFERENTIAL/PLATELET
BASOS ABS: 0 10*3/uL (ref 0.0–0.1)
BASOS PCT: 0 %
EOS ABS: 0 10*3/uL (ref 0.0–0.7)
EOS PCT: 0 %
HCT: 31.7 % — ABNORMAL LOW (ref 39.0–52.0)
Hemoglobin: 10.6 g/dL — ABNORMAL LOW (ref 13.0–17.0)
Lymphocytes Relative: 5 %
Lymphs Abs: 0.8 10*3/uL (ref 0.7–4.0)
MCH: 27.3 pg (ref 26.0–34.0)
MCHC: 33.4 g/dL (ref 30.0–36.0)
MCV: 81.7 fL (ref 78.0–100.0)
Monocytes Absolute: 3.7 10*3/uL — ABNORMAL HIGH (ref 0.1–1.0)
Monocytes Relative: 23 %
NEUTROS PCT: 72 %
Neutro Abs: 11.7 10*3/uL — ABNORMAL HIGH (ref 1.7–7.7)
PLATELETS: 49 10*3/uL — AB (ref 150–400)
RBC: 3.88 MIL/uL — AB (ref 4.22–5.81)
RDW: 15.4 % (ref 11.5–15.5)
WBC: 16.2 10*3/uL — AB (ref 4.0–10.5)

## 2016-06-10 LAB — AMMONIA: AMMONIA: 38 umol/L — AB (ref 9–35)

## 2016-06-10 LAB — SODIUM: SODIUM: 130 mmol/L — AB (ref 135–145)

## 2016-06-10 LAB — LACTIC ACID, PLASMA: LACTIC ACID, VENOUS: 1 mmol/L (ref 0.5–1.9)

## 2016-06-10 MED ORDER — HALOPERIDOL LACTATE 5 MG/ML IJ SOLN
5.0000 mg | Freq: Four times a day (QID) | INTRAMUSCULAR | Status: DC | PRN
  Administered 2016-06-10 (×2): 5 mg via INTRAVENOUS
  Filled 2016-06-10 (×2): qty 1

## 2016-06-10 MED ORDER — FENTANYL CITRATE (PF) 100 MCG/2ML IJ SOLN
INTRAMUSCULAR | Status: AC
  Administered 2016-06-11: 100 ug
  Filled 2016-06-10: qty 2

## 2016-06-10 MED ORDER — HALOPERIDOL LACTATE 5 MG/ML IJ SOLN
5.0000 mg | Freq: Once | INTRAMUSCULAR | Status: AC
  Administered 2016-06-10: 5 mg via INTRAVENOUS
  Filled 2016-06-10: qty 1

## 2016-06-10 MED ORDER — CHLORHEXIDINE GLUCONATE 0.12% ORAL RINSE (MEDLINE KIT)
15.0000 mL | Freq: Two times a day (BID) | OROMUCOSAL | Status: DC
  Administered 2016-06-11: 15 mL via OROMUCOSAL

## 2016-06-10 MED ORDER — MIDAZOLAM HCL 2 MG/2ML IJ SOLN
INTRAMUSCULAR | Status: AC
  Administered 2016-06-11: 2 mg
  Filled 2016-06-10: qty 2

## 2016-06-10 MED ORDER — FENTANYL CITRATE (PF) 100 MCG/2ML IJ SOLN
100.0000 ug | INTRAMUSCULAR | Status: DC | PRN
  Administered 2016-06-11: 100 ug via INTRAVENOUS
  Filled 2016-06-10: qty 2

## 2016-06-10 MED ORDER — FENTANYL CITRATE (PF) 100 MCG/2ML IJ SOLN
100.0000 ug | INTRAMUSCULAR | Status: DC | PRN
  Administered 2016-06-11: 100 ug via INTRAVENOUS
  Filled 2016-06-10 (×2): qty 2

## 2016-06-10 MED ORDER — POTASSIUM CHLORIDE CRYS ER 20 MEQ PO TBCR
40.0000 meq | EXTENDED_RELEASE_TABLET | ORAL | Status: AC
  Administered 2016-06-10 (×2): 40 meq via ORAL
  Filled 2016-06-10 (×2): qty 2

## 2016-06-10 MED ORDER — FAMOTIDINE IN NACL 20-0.9 MG/50ML-% IV SOLN
20.0000 mg | Freq: Two times a day (BID) | INTRAVENOUS | Status: DC
  Administered 2016-06-11 – 2016-06-18 (×15): 20 mg via INTRAVENOUS
  Filled 2016-06-10 (×15): qty 50

## 2016-06-10 MED ORDER — ORAL CARE MOUTH RINSE
15.0000 mL | Freq: Four times a day (QID) | OROMUCOSAL | Status: DC
  Administered 2016-06-11 – 2016-06-20 (×39): 15 mL via OROMUCOSAL

## 2016-06-10 MED ORDER — DEXMEDETOMIDINE HCL IN NACL 200 MCG/50ML IV SOLN
0.0000 ug/kg/h | INTRAVENOUS | Status: DC
  Administered 2016-06-10 – 2016-06-11 (×2): 0.4 ug/kg/h via INTRAVENOUS
  Filled 2016-06-10 (×3): qty 50

## 2016-06-10 MED ORDER — SODIUM CHLORIDE 0.9 % IV BOLUS (SEPSIS)
250.0000 mL | Freq: Once | INTRAVENOUS | Status: AC
  Administered 2016-06-10: 250 mL via INTRAVENOUS

## 2016-06-10 NOTE — Progress Notes (Signed)
PT Cancellation Note  Patient Details Name: Jeff Foster MRN: [id number] DOB: [birth year]   Cancelled Treatment:    Reason Eval/Treat Not Completed: Medical issues which prohibited therapy (requires $ point restraints)   [name], Veyda Kaufman Elizabeth 06/10/2016, 3:13 PM

## 2016-06-10 NOTE — Progress Notes (Signed)
Patient ID: [name], male   DOB: 07/03/1961, 55 y.o.   MRN: [id number]  Received psychiatric consultation for medication management of combative behavior. Psychiatrist and also psychiatric social service attempted to evaluate the patient, briefly case discussed with the staff RN and hospitalist. Patient is known to this provider from his multiple psychiatric consultations in the emergency departments and also an medical floors. Patient is currently under sedation with the Haldol and also has 4 point soft restraints to prevent danger to himself and others. Patient will be seen when it is appropriate and is able to participate in this evaluation. Reviewed the information documented and agree with the treatment plan.  Tewana Bohlen 06/10/2016 4:22 PM

## 2016-06-10 NOTE — Procedures (Signed)
Intubation Procedure Note Genene ChurnJohn Geno [id number] 02-12-1961  Procedure: Intubation Indications: Airway protection and maintenance  Procedure Details Consent: Unable to obtain consent because of altered level of consciousness. Time Out: Verified patient identification, verified procedure, site/side was marked, verified correct patient position, special equipment/implants available, medications/allergies/relevent history reviewed, required imaging and test results available.  Performed  Hyacinth MeekerMiller and 3   Evaluation Hemodynamic Status: BP stable throughout; O2 sats: transiently fell during during procedure Patient's Current Condition: stable Complications: No apparent complications Patient did tolerate procedure well. Chest X-ray ordered to verify placement.  CXR: pending.   Tamerra Merkley 06/10/2016

## 2016-06-10 NOTE — Progress Notes (Signed)
Palliative Care  Mr. Jeff Foster was asleep and appeared comfortable and calm when I visited his bedside. Per his care nurse, he has been calm and resting since this morning when he received a dose of IV Haldol. He remains in four point restraints after becoming increasingly combative overnight. He does have a history of violent outbursts. He is, at baseline, not decisional regarding his care. He does have a legal guardian appointed through the department of social services, who SW identified as Jeff Foster [phone]). I have left two messages on her cell phone, with my contact number provided, and have not yet heard back from her. I will need to speak with Ms. Astrid Draftsierman to proceed with a goals of care and code status discussion. My contact information was also provided to care nurse to pass along in case Ms. Foster calls or arrives on the floor.    Murrell ConverseSarah Samul Mcinroy NP  Palliative Care [phone]; team phone  No charge note.

## 2016-06-10 NOTE — Progress Notes (Addendum)
PROGRESS NOTE    Jeff Foster  NGE:[id number] DOB: 09-16-1960 DOA: 27-Jun-2016  PCP: Diamantina Providence, FNP   Brief Narrative:  Jeff Foster is a 55 y.o. male with medical history significant of MR, intermittent explosive behavior, psychosis, seizure disorder, CVA,dysphagia, hypothyroidism, anemia, and thrombocytopenia; who presents with tremors and falls, Patient was noted over last 24 hours to have been coming more drowsy confused and unable to walk even with a walker. No report of any seizure-like activity was noted.  Workup significant for aspiration pneumonia, patient with known history of high risk for aspiration, with multiple admissions in the past for aspiration pneumonia, rule patient with agitation and combativeness required him to be transferred to stepdown.  Subjective: Can't answer questions appropriately, patient with fever, cough overnight  Assessment & Plan:   Encephalopathy acute - tremors, falls- h/o   Aggressive behavior &  Mental retardation - per records, he has a history of this type of aggressive behavior and this is more the norm for him. - With fluctuating mental status, agitation yesterday required multiple doses  of haloperidol, and transferred to stepdown.QTC on tele is 470 today. - on Depakote, Thorazine, neurontin, Hydroxyzine, Ativan, Remeron, Seroquel, Zoloft, Trazodone - Psychiatric consulted to further assist with medication management for combative behavior. - MRI and CT head without acute issues  Aspiration pneumonia - Patient high risk for aspiration, multiple admissions for pneumonia, reviewing palliative note consult in 07/25/2015, the patient one point he was comfort care for his severe dysphagia, but he did recover. - Patient with respiratory distress, cough with a productive sputum, chest x-ray with evidence of acute lower lung infiltrate suspicious for aspiration - Continue with IV Zosyn for aspiration pneumonia, continue with DuoNeb nebs, chest PT,  flutter valve. - Palliative care consulted to address goal status and goals of care.  Oral thrush - We'll start on Diflucan  Chronic lacunar infarcts on MRI - start baby aspirin  Hyponatremia - Continue with normal saline    Hypothyroidism - TSH normal - cont synthroid    Thrombocytopenia  - chronic, SCd fr DVT prox.    Seizure  - on Depakote and Trileptal for this?  - Depakote level normal  U retention - admitted for this in 9/17 and started on Flomax    DVT prophylaxis: SCD Code Status: Full code,  diagnosis : guarded, intermediate to long-term prognosis is  poor Family Communication:  Left a message for legal guardian, spoke with his caregiver, Disposition Plan: return to group home Consultants:   Palliative care consult, psychiatric consult Procedures:    Antimicrobials:  Anti-infectives    Start     Dose/Rate Route Frequency Ordered Stop   06/09/16 2200  piperacillin-tazobactam (ZOSYN) IVPB 3.375 g     3.375 g 12.5 mL/hr over 240 Minutes Intravenous Every 8 hours 06/09/16 1515     06/09/16 1600  piperacillin-tazobactam (ZOSYN) IVPB 3.375 g     3.375 g 100 mL/hr over 30 Minutes Intravenous  Once 06/09/16 1515 06/09/16 1629   06/09/16 1500  fluconazole (DIFLUCAN) IVPB 200 mg     200 mg 100 mL/hr over 60 Minutes Intravenous Every 24 hours 06/09/16 1429         Objective: Vitals:   06/10/16 0500 06/10/16 0600 06/10/16 0800 06/10/16 0935  BP: (!) 131/58 (!) 119/56    Pulse: 91 88    Resp: (!) 27 (!) 32    Temp:  (!) 101.3 F (38.5 C) (!) 101.8 F (38.8 C)   TempSrc:  Oral  Axillary   SpO2: 95% 94%  94%  Weight:      Height:        Intake/Output Summary (Last 24 hours) at 06/10/16 1124 Last data filed at 06/10/16 1000  Gross per 24 hour  Intake             1795 ml  Output              530 ml  Net             1265 ml   Filed Weights   06/24/2016 2220 06/08/16 0500 06/09/16 2020  Weight: 100.2 kg (221 lb) 100.2 kg (221 lb) 101 kg (222 lb 10.6  oz)    Examination: General exam: Sleeping comfortably, currently on restraints, HEENT:  oral mucosa moist, mild oral thrush Respiratory system: Clear to auscultation. Respiratory effort normal. Cardiovascular system: S1 & S2 heard, RRR.  No murmurs  Gastrointestinal system: Abdomen soft, non-tender, nondistended. Normal bowel sound. No organomegaly Central nervous system: Alert No focal neurological deficits. Extremities: No cyanosis, clubbing or edema Skin: No rashes or ulcers Psychiatry:  agitated    Data Reviewed: I have personally reviewed following labs and imaging studies  CBC:  Recent Labs Lab 06/24/2016 1129 06/08/16 0454 06/09/16 0634  WBC 8.2 6.9 8.5  NEUTROABS 5.6  --   --   HGB 11.4* 10.8* 10.8*  HCT 34.0* 31.8* 32.0*  MCV 81.3 81.1 81.4  PLT 88* 73* 61*   Basic Metabolic Panel:  Recent Labs Lab 06/24/2016 1129 06/08/16 0454  06/08/16 2249 06/09/16 0634 06/09/16 1406 06/09/16 2225 06/10/16 0626  NA 129* 128*  < > 130* 133* 129* 134* 130*  K 4.4 4.2  --   --  4.5  --   --   --   CL 91* 91*  --   --  95*  --   --   --   CO2 30 31  --   --  32  --   --   --   GLUCOSE 113* 91  --   --  95  --   --   --   BUN 7 7  --   --  10  --   --   --   CREATININE 0.72 0.68  --   --  0.80  --   --   --   CALCIUM 9.0 9.0  --   --  9.0  --   --   --   < > = values in this interval not displayed. GFR: Estimated Creatinine Clearance: 130.8 mL/min (by C-G formula based on SCr of 0.8 mg/dL). Liver Function Tests: No results for input(s): AST, ALT, ALKPHOS, BILITOT, PROT, ALBUMIN in the last 168 hours. No results for input(s): LIPASE, AMYLASE in the last 168 hours. No results for input(s): AMMONIA in the last 168 hours. Coagulation Profile: No results for input(s): INR, PROTIME in the last 168 hours. Cardiac Enzymes: No results for input(s): CKTOTAL, CKMB, CKMBINDEX, TROPONINI in the last 168 hours. BNP (last 3 results) No results for input(s): PROBNP in the last 8760  hours. HbA1C: No results for input(s): HGBA1C in the last 72 hours. CBG: No results for input(s): GLUCAP in the last 168 hours. Lipid Profile: No results for input(s): CHOL, HDL, LDLCALC, TRIG, CHOLHDL, LDLDIRECT in the last 72 hours. Thyroid Function Tests:  Recent Labs  06/24/2016 1451  TSH 0.876   Anemia Panel: No results for input(s): VITAMINB12, FOLATE, FERRITIN, TIBC, IRON, RETICCTPCT in  the last 72 hours. Urine analysis:    Component Value Date/Time   COLORURINE YELLOW 06/09/2016 1321   APPEARANCEUR CLEAR 06/04/2016 1321   LABSPEC 1.012 06/21/2016 1321   PHURINE 7.0 06/22/2016 1321   GLUCOSEU NEGATIVE 06/28/2016 1321   HGBUR SMALL (A) 07/03/2016 1321   BILIRUBINUR NEGATIVE 06/22/2016 1321   KETONESUR NEGATIVE 06/29/2016 1321   PROTEINUR NEGATIVE 07/02/2016 1321   NITRITE NEGATIVE 06/30/2016 1321   LEUKOCYTESUR NEGATIVE 06/10/2016 1321   Sepsis Labs: @LABRCNTIP (procalcitonin:4,lacticidven:4) ) Recent Results (from the past 240 hour(s))  Culture, blood (Routine X 2) w Reflex to ID Panel     Status: None (Preliminary result)   Collection Time: 06/08/16  3:04 PM  Result Value Ref Range Status   Specimen Description BLOOD LEFT HAND  Final   Special Requests IN PEDIATRIC BOTTLE 2.5 CC  Final   Culture   Final    NO GROWTH 2 DAYS Performed at [facility]    Report Status PENDING  Incomplete  Culture, blood (Routine X 2) w Reflex to ID Panel     Status: None (Preliminary result)   Collection Time: 06/08/16  3:04 PM  Result Value Ref Range Status   Specimen Description BLOOD RIGHT ARM  Final   Special Requests BOTTLES DRAWN AEROBIC AND ANAEROBIC 10 CC  Final   Culture   Final    NO GROWTH 2 DAYS Performed at [facility]    Report Status PENDING  Incomplete  MRSA PCR Screening     Status: None   Collection Time: 06/10/16 12:37 AM  Result Value Ref Range Status   MRSA by PCR NEGATIVE NEGATIVE Final    Comment:        The GeneXpert MRSA Assay  (FDA approved for NASAL specimens only), is one component of a comprehensive MRSA colonization surveillance program. It is not intended to diagnose MRSA infection nor to guide or monitor treatment for MRSA infections.          Radiology Studies: Dg Chest Port 1 View  Result Date: 06/09/2016 CLINICAL DATA:  Hypoxia EXAM: PORTABLE CHEST 1 VIEW COMPARISON:  06/30/2016 FINDINGS: Cardiomediastinal silhouette is stable. There is streaky infiltrate in right lower lobe laterally highly suspicious for pneumonia. Follow-up to resolution is recommended. No pulmonary edema. IMPRESSION: Streaky infiltrate right lower lobe laterally highly suspicious for pneumonia. Follow-up to resolution is recommended. No pulmonary edema. Electronically Signed   By: Natasha Mead M.D.   On: 06/09/2016 15:32      Scheduled Meds: . sodium chloride   Intravenous Once  . aspirin  81 mg Oral Daily  . atorvastatin  40 mg Oral QHS  . chlorproMAZINE  50 mg Oral TID  . divalproex  1,000 mg Oral Q breakfast  . fluconazole (DIFLUCAN) IV  200 mg Intravenous Q24H  . gabapentin  300 mg Oral TID  . guaiFENesin  1,200 mg Oral BID  . ipratropium-albuterol  3 mL Nebulization TID  . levothyroxine  25 mcg Oral QAC breakfast  . LORazepam  1 mg Oral BID  . mirtazapine  15 mg Oral QHS  . oxcarbazepine  600 mg Oral BID  . pantoprazole  40 mg Oral Daily  . piperacillin-tazobactam (ZOSYN)  IV  3.375 g Intravenous Q8H  . potassium chloride  40 mEq Oral Q4H  . QUEtiapine  200 mg Oral QHS  . sertraline  100 mg Oral Daily  . tamsulosin  0.4 mg Oral QHS  . trazodone  300 mg Oral QHS   Continuous  Infusions: . sodium chloride 75 mL/hr at 06/10/16 1000     LOS: 3 days     Huey BienenstockELGERGAWY, Evie Crumpler, MD Pager [phone] [facility] Pager: [email or web address] Password [email or web address] 06/10/2016, 11:24 AM

## 2016-06-10 NOTE — Consult Note (Signed)
PULMONARY / CRITICAL CARE MEDICINE   Name: Jeff Foster MRN: [id number] DOB: 16-Jul-1960    ADMISSION DATE:  Jul 07, 2016 CONSULTATION DATE:  06/10/2016  REFERRING MD:  Dr. 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 He has a legal guardian on file, but staff has been unable to get a hold of them. He was admitted to [facility] 12/4 after presenting with altered mental status, drowsiness, and weakness. Workup in the ED was largely non-diagnostic. CXR and urine without obvious infectious source, MRI brain without obvious encephalopathic etiology. He was admitted by the hospitalist team. Hospital course has been complicated by intermittent agitation (severely aggressive with staff) and he has since developed what is suspected to be aspiration pneumonia on CXR and started on Zosyn. 12/7 his mental status declined and he became minimally responsive. Unable to protect airway. PCCM asked to see for possible intubation.  PAST MEDICAL HISTORY :  He  has a past medical history of Intermittent explosive disorder; MR (mental retardation); Paranoid schizophrenia (HCC); Psychosis; Seizure (HCC); and Stroke (cerebrum) (HCC).  PAST SURGICAL HISTORY: He  has no past surgical history on file.  Allergies  Allergen Reactions  . Other Other (See Comments)    Pt is allergic to chlorine and bleach.   Reaction:  Unknown   . Peanut-Containing Drug Products Diarrhea    No current facility-administered medications on file prior to encounter.    Current Outpatient Prescriptions on File Prior to Encounter  Medication Sig  . atorvastatin (LIPITOR) 40 MG tablet Take 40 mg by mouth at bedtime.  . chlorproMAZINE (THORAZINE) 50 MG tablet Take 1 tablet (50 mg total) by mouth 3 (three) times daily.  . divalproex (DEPAKOTE) 500 MG DR tablet  Take 1,000 mg by mouth daily with breakfast.  . gabapentin (NEURONTIN) 300 MG capsule Take 1 capsule (300 mg total) by mouth 3 (three) times daily.  . hydrOXYzine (ATARAX/VISTARIL) 25 MG tablet Take 1 tablet (25 mg total) by mouth 3 (three) times daily.  Marland Kitchen levothyroxine (SYNTHROID, LEVOTHROID) 25 MCG tablet Take 1 tablet (25 mcg total) by mouth daily before breakfast.  . LORazepam (ATIVAN) 1 MG tablet Take 1 tablet (1 mg total) by mouth 2 (two) times daily.  . mirtazapine (REMERON) 15 MG tablet Take 1 tablet (15 mg total) by mouth at bedtime.  . Oxcarbazepine (TRILEPTAL) 600 MG tablet Take 1 tablet (600 mg total) by mouth 2 (two) times daily.  . pantoprazole (PROTONIX) 40 MG tablet Take 1 tablet (40 mg total) by mouth daily.  . QUEtiapine (SEROQUEL) 200 MG tablet Take 1 tablet (200 mg total) by mouth at bedtime.  . sertraline (ZOLOFT) 100 MG tablet Take 1 tablet (100 mg total) by mouth daily.  . tamsulosin (FLOMAX) 0.4 MG CAPS capsule Take 1 capsule (0.4 mg total) by mouth at bedtime.  . traZODone (DESYREL) 300 MG tablet Take 1 tablet (300 mg total) by mouth at bedtime.    FAMILY HISTORY:  His has no family status information on file.    SOCIAL HISTORY: He  reports that he has quit smoking. He has never used smokeless tobacco.  REVIEW OF SYSTEMS:   unable  SUBJECTIVE:    VITAL SIGNS: BP (!) 123/54 (BP Location: Left Arm)   Pulse (!) 113   Temp (!) 101.2 F (38.4 C) (Axillary)   Resp (!) 28  Ht 6\' 2"  (1.88 m)   Wt 101 kg (222 lb 10.6 oz)   SpO2 99%   BMI 28.59 kg/m   HEMODYNAMICS:    VENTILATOR SETTINGS: FiO2 (%):  [50 %] 50 %  INTAKE / OUTPUT: I/O last 3 completed shifts: In: 3665 [P.O.:1290; I.V.:1775; IV Piggyback:600] Out: 1300 [Urine:1300]  PHYSICAL EXAMINATION: General:  Middle aged male in mild distress Neuro: obtunded, poor cough, absent gag HEENT: [place]/AT, PERRL, no JVD Cardiovascular:  Tachy, regular, no MRG Lungs: Coarse bilateral, pooled upper airway  secretions.  Abdomen: Soft, non-tender, non-distended Musculoskeletal:  No acute deformity Skin:  Grossly intact  LABS:  BMET  Recent Labs Lab 11-25-15 1129 06/08/16 0454  06/09/16 0634 06/09/16 1406 06/09/16 2225 06/10/16 0626  NA 129* 128*  < > 133* 129* 134* 130*  K 4.4 4.2  --  4.5  --   --   --   CL 91* 91*  --  95*  --   --   --   CO2 30 31  --  32  --   --   --   BUN 7 7  --  10  --   --   --   CREATININE 0.72 0.68  --  0.80  --   --   --   GLUCOSE 113* 91  --  95  --   --   --   < > = values in this interval not displayed.  Electrolytes  Recent Labs Lab 11-25-15 1129 06/08/16 0454 06/09/16 0634  CALCIUM 9.0 9.0 9.0    CBC  Recent Labs Lab 11-25-15 1129 06/08/16 0454 06/09/16 0634  WBC 8.2 6.9 8.5  HGB 11.4* 10.8* 10.8*  HCT 34.0* 31.8* 32.0*  PLT 88* 73* 61*    Coag's No results for input(s): APTT, INR in the last 168 hours.  Sepsis Markers  Recent Labs Lab 11-25-15 1150  LATICACIDVEN 1.43    ABG  Recent Labs Lab 06/10/16 2300  PHART 7.339*  PCO2ART 59.6*  PO2ART 70.6*    Liver Enzymes No results for input(s): AST, ALT, ALKPHOS, BILITOT, ALBUMIN in the last 168 hours.  Cardiac Enzymes No results for input(s): TROPONINI, PROBNP in the last 168 hours.  Glucose No results for input(s): GLUCAP in the last 168 hours.  Imaging No results found.   STUDIES:  MRI brain 12/4 > no evidence acute infarct or hemorrhage, moderate brain parenchymal volume loss (stable).  CULTURES: Blood 12/5 >>>   ANTIBIOTICS: Zosyn 12/7 > Fluconazole 12/7 >  SIGNIFICANT EVENTS: 12/4 - admit 12/7 - Intubated for airway protection  LINES/TUBES: ETT 12/7 >  DISCUSSION:   ASSESSMENT / PLAN:  PULMONARY A: Acute hypoxemic respiratory failure secondary to aspiration PNA  P:   STAT intubation and full vent support ABG CXR VAP bundle  CARDIOVASCULAR A:  No acute issues  P:  Telemetry monitoring  RENAL A:   Hyponatremia  P:    Monitor Gentle IVF Repeat BMP in AM  GASTROINTESTINAL A:   Dysphagia  P:   NPO He has a history of this. Palliative has been called and has been unable to reach legal guardian.   HEMATOLOGIC A:   Anemia - hbg just below baseline Thrombocytopenia  P:  Follow CBC SCDs Hold heparin due to low plt. Decreasing. Check HIT panel  INFECTIOUS A:   Aspiration PNA  P:   Continue Zosyn, fluconazole Follow cultures  ENDOCRINE A:   No acute issues  P:   Follow glucose  NEUROLOGIC  A:   Intellectual disability Paranoid shizophrenia P:   RASS goal: -1 to -2 Precedex infusion PRN fentanyl Hold sedating meds (thorazine, gabapentin, haldol, hydroxizine) Continue depakote and synthroid via tube   FAMILY  - Updates: Unable to reach guardian  - Inter-disciplinary family meet or Palliative Care meeting due by:  12/14   Joneen RoachPaul Hoffman, AGACNP-BC [facility] Pager [phone] or [phone]  06/10/2016 11:49 PM   Attending note: I have seen and examined the patient with nurse practitioner/resident and agree with the note. History, labs and imaging reviewed.  55 year old with mental retardation, intermittent explosive disorder, schizophrenia, seizures, recurrent aspiration. Admitted with altered mental status. Workup was nondiagnostic. He was noted to be hyponatremic and dehydrated. Developed aspiration pneumonia. Currently on Zosyn for pneumonia and Diflucan for thrush. PCCM consulted for worsening mental status, inability to protect airway.  Blood pressure (!) 123/54, pulse (!) 113, temperature (!) 101.2 F (38.4 C), temperature source Axillary, resp. rate (!) 28, height 6\' 2"  (1.88 m), weight 222 lb 10.6 oz (101 kg), SpO2 100 %. Sedated, Unresponsive HEENT- PERRL CVS-RRR RS- B/L rhonchi Abd- Soft, + BS Ext- No edema  Labs and imaging reviewed  Assessment: Altered mental status Aspiration PNA in setting of known dysphagia Progressive  resp failure Thrombocytopenia, leukocytosis  - Intuabate, follow CXR, ABG - Continue zosyn for pna and diflucan for thrush - Check HIT panel for thrombocytopenia - Follow Cx and Pct  The patient is critically ill with multiple organ systems failure and requires high complexity decision making for assessment and support, frequent evaluation and titration of therapies, application of advanced monitoring technologies and extensive interpretation of multiple databases.  Critical care time - 35 mins. This represents my time independent of the NPs time taking care of the pt.  Chilton GreathousePraveen Sherelle Castelli MD [facility] Pager [phone] If no answer or after 3pm call: [phone] 06/10/2016, 11:59 PM

## 2016-06-10 NOTE — Progress Notes (Signed)
RN paged because pt was essentially unresponsive except to pain and was not able to protect his airway. Desatting if jaw not supported. NP to bedside. HPI: Pt admitted for aspiration PNA with a hx of dysphagia, old CVA, MR, explosive behavior, psychosis, schizophrenia, among others. Has been on Zosyn for aspiration. Pt has required multiple doses of Haldol during hospitalization for agitation and behavioral issues.  CT/MRI brain performed on 12/4 showed no acute issues. + evidence of old basilar infarcts.  S: Pt can not participate in ROS due to mental status. Per RN, pt usually able to take meds in applesauce, but not tonight. Pt has been coughing with secretions noted. HOB has been elevated. Pt's mental status has worsened over the past hour and he began to desat without support of airway. NRB 100% placed on pt prior to NP arrival. Pt has not received any sedative medication in over 6 hours. O: Poor appearing, acute on chronically ill appearing WM. Appears toxic. Skin grayish. No icterus. Pupils equal, sluggish. Unresponsive except to aggressive sternal rub and painful stimuli. Only wakes up for a few seconds, then back to unresponsive. Moans, but no conversation or comprehendible speech. Does not follow commands. O2 sat 100% on NRB with airway support. Drops to 80s on NRB without airway support. No gag reflex noted.  RRR, S1S2 heard. Lungs congested. No LE edema.  A/P: 1. Worsening mental status of unknown etiology-ABG with elevated PCO2, so likely contributing. Aspiration PNA, NPO and continue abx. PCCM called immediately for intubation for airway support and mngt. PCCM assumed care at that point. Ammonia, CBC with diff, CMP, LA, and CXR ordered.  [facility], NP [facility]

## 2016-06-11 ENCOUNTER — Inpatient Hospital Stay (HOSPITAL_COMMUNITY): Payer: Medicare Other

## 2016-06-11 DIAGNOSIS — Z7189 Other specified counseling: Secondary | ICD-10-CM

## 2016-06-11 DIAGNOSIS — Z01818 Encounter for other preprocedural examination: Secondary | ICD-10-CM

## 2016-06-11 DIAGNOSIS — J189 Pneumonia, unspecified organism: Secondary | ICD-10-CM

## 2016-06-11 LAB — BLOOD GAS, ARTERIAL
Acid-Base Excess: 5.1 mmol/L — ABNORMAL HIGH (ref 0.0–2.0)
BICARBONATE: 29.9 mmol/L — AB (ref 20.0–28.0)
DRAWN BY: 11249
FIO2: 100
O2 Saturation: 98.4 %
PEEP: 5 cmH2O
Patient temperature: 100.8
RATE: 16 resp/min
VT: 660 mL
pCO2 arterial: 49.6 mmHg — ABNORMAL HIGH (ref 32.0–48.0)
pH, Arterial: 7.403 (ref 7.350–7.450)
pO2, Arterial: 143 mmHg — ABNORMAL HIGH (ref 83.0–108.0)

## 2016-06-11 LAB — BASIC METABOLIC PANEL
ANION GAP: 9 (ref 5–15)
BUN: 15 mg/dL (ref 6–20)
CALCIUM: 8.8 mg/dL — AB (ref 8.9–10.3)
CO2: 29 mmol/L (ref 22–32)
CREATININE: 0.95 mg/dL (ref 0.61–1.24)
Chloride: 98 mmol/L — ABNORMAL LOW (ref 101–111)
Glucose, Bld: 112 mg/dL — ABNORMAL HIGH (ref 65–99)
Potassium: 4.4 mmol/L (ref 3.5–5.1)
Sodium: 136 mmol/L (ref 135–145)

## 2016-06-11 LAB — PROCALCITONIN: Procalcitonin: 0.35 ng/mL

## 2016-06-11 LAB — MAGNESIUM
MAGNESIUM: 1.7 mg/dL (ref 1.7–2.4)
Magnesium: 1.7 mg/dL (ref 1.7–2.4)

## 2016-06-11 LAB — CBC
HCT: 25.6 % — ABNORMAL LOW (ref 39.0–52.0)
Hemoglobin: 8.5 g/dL — ABNORMAL LOW (ref 13.0–17.0)
MCH: 27.4 pg (ref 26.0–34.0)
MCHC: 33.2 g/dL (ref 30.0–36.0)
MCV: 82.6 fL (ref 78.0–100.0)
PLATELETS: 72 10*3/uL — AB (ref 150–400)
RBC: 3.1 MIL/uL — ABNORMAL LOW (ref 4.22–5.81)
RDW: 15.5 % (ref 11.5–15.5)
WBC: 20.6 10*3/uL — AB (ref 4.0–10.5)

## 2016-06-11 LAB — GLUCOSE, CAPILLARY
GLUCOSE-CAPILLARY: 111 mg/dL — AB (ref 65–99)
GLUCOSE-CAPILLARY: 92 mg/dL (ref 65–99)
GLUCOSE-CAPILLARY: 101 mg/dL — AB (ref 65–99)

## 2016-06-11 LAB — PHOSPHORUS
Phosphorus: 3.6 mg/dL (ref 2.5–4.6)
Phosphorus: 3.3 mg/dL (ref 2.5–4.6)

## 2016-06-11 MED ORDER — SODIUM CHLORIDE 0.9 % IV BOLUS (SEPSIS)
500.0000 mL | Freq: Once | INTRAVENOUS | Status: AC
  Administered 2016-06-11: 500 mL via INTRAVENOUS

## 2016-06-11 MED ORDER — MIDAZOLAM HCL 2 MG/2ML IJ SOLN
2.0000 mg | INTRAMUSCULAR | Status: DC | PRN
  Administered 2016-06-11 – 2016-06-15 (×9): 2 mg via INTRAVENOUS
  Administered 2016-06-15: 1 mg via INTRAVENOUS
  Filled 2016-06-11 (×10): qty 2

## 2016-06-11 MED ORDER — ACETYLCYSTEINE 10 % IN SOLN
2.0000 mL | Freq: Three times a day (TID) | RESPIRATORY_TRACT | Status: DC
Start: 2016-06-11 — End: 2016-06-11
  Filled 2016-06-11: qty 4

## 2016-06-11 MED ORDER — SODIUM CHLORIDE 0.9 % IV SOLN
25.0000 ug/h | INTRAVENOUS | Status: DC
  Administered 2016-06-11: 25 ug/h via INTRAVENOUS
  Administered 2016-06-13 – 2016-06-15 (×2): 50 ug/h via INTRAVENOUS
  Administered 2016-06-15: 100 ug/h via INTRAVENOUS
  Filled 2016-06-11 (×5): qty 50

## 2016-06-11 MED ORDER — VALPROATE SODIUM 500 MG/5ML IV SOLN
500.0000 mg | Freq: Two times a day (BID) | INTRAVENOUS | Status: DC
  Administered 2016-06-11: 500 mg via INTRAVENOUS
  Filled 2016-06-11: qty 5

## 2016-06-11 MED ORDER — BISACODYL 10 MG RE SUPP
10.0000 mg | Freq: Every day | RECTAL | Status: DC | PRN
  Administered 2016-06-15: 10 mg via RECTAL
  Filled 2016-06-11: qty 1

## 2016-06-11 MED ORDER — ACETYLCYSTEINE 20 % IN SOLN
2.0000 mL | Freq: Three times a day (TID) | RESPIRATORY_TRACT | Status: DC
  Administered 2016-06-11 (×2): 2 mL via RESPIRATORY_TRACT
  Administered 2016-06-12: 03:00:00 via RESPIRATORY_TRACT
  Administered 2016-06-12 – 2016-06-13 (×4): 2 mL via RESPIRATORY_TRACT
  Administered 2016-06-13: 12:00:00 via RESPIRATORY_TRACT
  Administered 2016-06-14: 2 mL via RESPIRATORY_TRACT
  Filled 2016-06-11 (×9): qty 4

## 2016-06-11 MED ORDER — PRO-STAT SUGAR FREE PO LIQD: 30.0000 mL | Freq: Two times a day (BID) | ORAL | Status: DC

## 2016-06-11 MED ORDER — ORAL CARE MOUTH RINSE
15.0000 mL | OROMUCOSAL | Status: DC
  Administered 2016-06-11 (×2): 15 mL via OROMUCOSAL

## 2016-06-11 MED ORDER — MIDAZOLAM HCL 2 MG/2ML IJ SOLN
2.0000 mg | INTRAMUSCULAR | Status: AC | PRN
Start: 2016-06-11 — End: 2016-06-14
  Administered 2016-06-14 (×3): 2 mg via INTRAVENOUS
  Filled 2016-06-11 (×4): qty 2

## 2016-06-11 MED ORDER — IPRATROPIUM-ALBUTEROL 0.5-2.5 (3) MG/3ML IN SOLN
3.0000 mL | RESPIRATORY_TRACT | Status: DC
  Administered 2016-06-11 – 2016-06-18 (×41): 3 mL via RESPIRATORY_TRACT
  Filled 2016-06-11 (×41): qty 3

## 2016-06-11 MED ORDER — VITAL HIGH PROTEIN PO LIQD
1000.0000 mL | ORAL | Status: DC
  Filled 2016-06-11: qty 1000

## 2016-06-11 MED ORDER — ETOMIDATE 2 MG/ML IV SOLN
0.3000 mg/kg | Freq: Once | INTRAVENOUS | Status: AC
  Administered 2016-06-11: 20 mg via INTRAVENOUS

## 2016-06-11 MED ORDER — ROCURONIUM BROMIDE 50 MG/5ML IV SOLN
1.0000 mg/kg | Freq: Once | INTRAVENOUS | Status: AC
  Administered 2016-06-11: 100 mg via INTRAVENOUS
  Filled 2016-06-11: qty 10.1

## 2016-06-11 MED ORDER — VALPROATE SODIUM 250 MG/5ML PO SOLN
500.0000 mg | Freq: Two times a day (BID) | ORAL | Status: DC
  Administered 2016-06-11 – 2016-06-18 (×14): 500 mg
  Filled 2016-06-11 (×14): qty 10

## 2016-06-11 MED ORDER — GLYCOPYRROLATE 0.2 MG/ML IJ SOLN
0.2000 mg | Freq: Four times a day (QID) | INTRAMUSCULAR | Status: DC
  Administered 2016-06-11 – 2016-06-15 (×16): 0.2 mg via INTRAVENOUS
  Filled 2016-06-11 (×18): qty 1

## 2016-06-11 MED ORDER — OSMOLITE 1.5 CAL PO LIQD
1000.0000 mL | ORAL | Status: DC
Start: 2016-06-11 — End: 2016-06-11
  Filled 2016-06-11: qty 1000

## 2016-06-11 MED ORDER — FENTANYL BOLUS VIA INFUSION
50.0000 ug | INTRAVENOUS | Status: DC | PRN
  Administered 2016-06-11 – 2016-06-17 (×11): 50 ug via INTRAVENOUS
  Filled 2016-06-11: qty 50

## 2016-06-11 MED ORDER — FENTANYL CITRATE (PF) 100 MCG/2ML IJ SOLN: 50.0000 ug | Freq: Once | INTRAMUSCULAR | Status: DC

## 2016-06-11 MED ORDER — CHLORHEXIDINE GLUCONATE 0.12% ORAL RINSE (MEDLINE KIT)
15.0000 mL | Freq: Two times a day (BID) | OROMUCOSAL | Status: DC
  Administered 2016-06-11 – 2016-06-20 (×18): 15 mL via OROMUCOSAL

## 2016-06-11 NOTE — Progress Notes (Signed)
PULMONARY / CRITICAL CARE MEDICINE   Name: Jeff Foster MRN: [id number] DOB: [birth year]    ADMISSION DATE:  06/27/2016 CONSULTATION DATE:  06/10/2016  REFERRING MD:  Dr. 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 He has a legal guardian on file, but staff has been unable to get a hold of them. He was admitted to [facility] 12/4 after presenting with altered mental status, drowsiness, and weakness. Workup in the ED was largely non-diagnostic. CXR and urine without obvious infectious source, MRI brain without obvious encephalopathic etiology. He was admitted by the hospitalist team. Hospital course has been complicated by intermittent agitation (severely aggressive with staff) and he has since developed what is suspected to be aspiration pneumonia on CXR and started on Zosyn. 12/7 his mental status declined and he became minimally responsive. Unable to protect airway. PCCM asked to see for possible intubation.    SUBJECTIVE:  Sedated or agitated  VITAL SIGNS: BP (!) 105/53   Pulse (!) 113   Temp (!) 102.1 F (38.9 C) (Oral)   Resp 18   Ht 6\' 2"  (1.88 m)   Wt 222 lb 10.6 oz (101 kg)   SpO2 92%   BMI 28.59 kg/m   HEMODYNAMICS:    VENTILATOR SETTINGS: Vent Mode: PRVC FiO2 (%):  [40 %-100 %] 40 % Set Rate:  [16 bmp] 16 bmp Vt Set:  [660 mL] 660 mL PEEP:  [5 cmH20] 5 cmH20 Plateau Pressure:  [17 cmH20-19 cmH20] 19 cmH20  INTAKE / OUTPUT: I/O last 3 completed shifts: In: 2930.4 [P.O.:580; I.V.:1640.4; NG/GT:60; IV Piggyback:650] Out: 1325 [Urine:1325]  PHYSICAL EXAMINATION: General:  Middle aged intubated and agitated Neuro: Agitated, maex 4, lashes out when stimulated. HEENT: [place]/AT, PERRL, no JVD Cardiovascular:  Tachy, regular, no MRG Lungs: Coarse bilateral, copious secretions Abdomen:  Soft, non-tender, non-distended Musculoskeletal:  No acute deformity Skin:  Grossly intact  LABS:  BMET  Recent Labs Lab 06/09/16 0634  06/10/16 0626 06/10/16 2259 06/11/16 0327  NA 133*  < > 130* 134* 136  K 4.5  --   --  4.1 4.4  CL 95*  --   --  97* 98*  CO2 32  --   --  30 29  BUN 10  --   --  14 15  CREATININE 0.80  --   --  0.83 0.95  GLUCOSE 95  --   --  128* 112*  < > = values in this interval not displayed.  Electrolytes  Recent Labs Lab 06/09/16 0634 06/10/16 2259 06/11/16 0327  CALCIUM 9.0 8.7* 8.8*    CBC  Recent Labs Lab 06/09/16 0634 06/10/16 2259 06/11/16 0327  WBC 8.5 16.2* 20.6*  HGB 10.8* 10.6* 8.5*  HCT 32.0* 31.7* 25.6*  PLT 61* 49* 72*    Coag's No results for input(s): APTT, INR in the last 168 hours.  Sepsis Markers  Recent Labs Lab 06/29/2016 1150 06/10/16 2259  LATICACIDVEN 1.43 1.0    ABG  Recent Labs Lab 06/10/16 2300 06/11/16 0100  PHART 7.339* 7.403  PCO2ART 59.6* 49.6*  PO2ART 70.6* 143*    Liver Enzymes  Recent Labs Lab 06/10/16 2259  AST 80*  ALT 18  ALKPHOS 69  BILITOT 0.9  ALBUMIN 3.3*    Cardiac Enzymes No results for input(s): TROPONINI, PROBNP in the last 168 hours.  Glucose No results for input(s): GLUCAP in the last 168 hours.  Imaging Dg Chest Port 1 View  Result Date: 06/11/2016 CLINICAL DATA:  ET tube adjustment EXAM: PORTABLE CHEST 1 VIEW COMPARISON:  06/11/2016 at 00:04 FINDINGS: The endotracheal tube is a few mm above the carina. Nasogastric tube extends well into the stomach and beyond the inferior edge of the image. No interval change in the patchy airspace opacity of both medial bases. No large effusions. No pneumothorax. IMPRESSION: 1. ET tube tip is just a few mm above the carina. 2. Nasogastric tube now extends well into the stomach 3. No significant interval change in the bilateral airspace opacities. Electronically Signed   By: Ellery Plunkaniel R Mitchell M.D.   On: 06/11/2016 03:45    Portable Chest Xray  Result Date: 06/11/2016 CLINICAL DATA:  Intubation.  Orogastric tube placement. EXAM: PORTABLE CHEST 1 VIEW COMPARISON:  06/09/2016 FINDINGS: Endotracheal tube is borderline low 1 position proximally 11 mm from the carina, retraction of 2-4 cm recommended. Tip of the enteric tube below the diaphragm in the stomach, side-port in the region of the gastroesophageal junction. Lung volumes are low. Right basilar opacity is less well-defined on the current exam, patchy right basilar opacity persists. There is new patchy left basilar airspace disease favoring atelectasis. Normal heart size. No pulmonary edema, pleural fluid or pneumothorax. Stable osseous structures. IMPRESSION: 1. Borderline low endotracheal tube tip 11 mm from the carina. Retraction of 2-4 cm recommended. 2. Tip of the enteric tube below the diaphragm, side-port at the gastroesophageal junction. Advancement of 5 cm with lead optimal positioning. 3. Improving right lung base aeration with residual patchy opacity. New left lower lobe opacity favoring atelectasis. Electronically Signed   By: Rubye OaksMelanie  Ehinger M.D.   On: 06/11/2016 00:50   Dg Abd Portable 1v  Result Date: 06/11/2016 CLINICAL DATA:  Orogastric tube adjustment EXAM: PORTABLE ABDOMEN - 1 VIEW COMPARISON:  06/11/2016 at 00:04 FINDINGS: The orogastric tube has been advanced and now extends well of the stomach with tip in the region of the pylorus or distal antrum. IMPRESSION: Enteric tube extends well into the stomach. Electronically Signed   By: Ellery Plunkaniel R Mitchell M.D.   On: 06/11/2016 03:47     STUDIES:  MRI brain 12/4 > no evidence acute infarct or hemorrhage, moderate brain parenchymal volume loss (stable).  CULTURES: Blood 12/5 >>>  Sputum 12/8 >>  ANTIBIOTICS: Zosyn 12/7 > Fluconazole 12/7 >  SIGNIFICANT EVENTS: 12/4 - admit 12/7 - Intubated for airway protection  LINES/TUBES: ETT 12/7 >  DISCUSSION: Agitated and requiring  sedation.  ASSESSMENT / PLAN:  PULMONARY A: Acute hypoxemic respiratory failure secondary to aspiration PNA  P:    intubation and full vent support Vent bundle Pulmonary toilet  CARDIOVASCULAR A:  No acute issues Hypotension post intubation that responded to fluids  P:  Telemetry monitoring HD montoring  RENAL  Recent Labs Lab 06/10/16 0626 06/10/16 2259 06/11/16 0327  NA 130* 134* 136    A:   Hyponatremia resolved 12/8  P:   Monitor Gentle IVF  BMP   GASTROINTESTINAL A:   Dysphagia  P:   NPO He has a history of this. Palliative has been called and has been unable to reach legal guardian.   HEMATOLOGIC  Recent Labs  06/10/16 2259 06/11/16 0327  HGB 10.6* 8.5*  Plt 46->72  A:   Anemia - hbg just below baseline Thrombocytopenia  P:  Follow CBC SCDs Hold heparin due to low plt. Decreasing. Check HIT panel  INFECTIOUS A:   Aspiration PNA  P:   Continue Zosyn, fluconazole Follow cultures  ENDOCRINE A:   No acute issues  P:   Follow glucose  NEUROLOGIC A:   Intellectual disability Paranoid shizophrenia P:   RASS goal: -1 to -2 Precedex infusion, switched to fentanyl 12/8 due to hypotension PRN fentanyl Hold sedating meds (thorazine, gabapentin, haldol, hydroxizine) Continue depakote and synthroid via tube Mental status will impeded extubation.   FAMILY  - Updates: Unable to reach guardian  - Inter-disciplinary family meet or Palliative Care meeting due by:  12/14  -CCT =30 min Brett CanalesSteve Minor ACNP Ad[facility] PollackLe [facility] PCCM Pager [phone] till 3 pm If no answer page [phone] 06/11/2016, 7:56 AM   ATTENDING NOTE / ATTESTATION NOTE :   I have discussed the case with the resident/APP Brett CanalesSteve Minor NP  I agree with the resident/APP's  history, physical examination, assessment, and plans.    I have edited the above note and modified it according to our agreed history, physical examination, assessment and plan.   Briefly, patient with  known mental retardation, explosive disorder, schizoaffective disorder, seizure disorder, ward of the state, admitted for altered mental status. Overnight, he developed respiratory failure and was unable to protect his airway so he was intubated. Neurologic workup was unremarkable on admission. Since being intubated, no issues except for blood pressure starting to drop related to sedation.  Patient seen, examined. BP 90/50, heart rate 83, respiratory rate 21, O2 sats 91% on 40% FiO2. MAXIMUM TEMPERATURE 102. Comfortable. Not in distress. He was agitated earlier. Neck veins were not prominent. Rhonchi in both lung fields. Good S1 and S2. No murmurs. Abdomen was soft, benign. Grade 1 edema.  Labs reviewed. Chest x-ray showed bibasilar infiltrate. WBC slowly trending up. Was 20 today. Lactic acid reassuring. Electrolytes were within normal.  Assessment/Plan: 1. Acute Hypoxemic Hypercapneic resp Fx 2/2 Asp PNA/HCAP - cont ventilatory support.  - try PST when able but anticipate no extubation this weekend.  Has a lot of thick secretions.  - cont CPT q 4, inc duoneb to q4, start mucomyt q8 - cont Broad spectrum abx > deescalate once with cultures.   2. Paranoid Schizophrenia - on Fentanyl dripn and versed PRN - Psych meds have been restarted  3. Start TF. D/C IVF once TF at goal 4. Thrombocytopenia - related to sepsis. Heparin on hold for now. SCDs.   5. Disposition: The patient has been in the past DO NOT RESUSCITATE and comfort care. Currently, he is a full code. Palliative care is involved. Sarah from palliative Care spoke with Sander RadonHeather Hicks from DSS re: pt's code status. We were hoping to make pt DNR, no escalation of care.  DSS will follow up with us.    I have spent  30 minutes of critical care time with this patient today.  Family : No family at bedside. Family is not involved with making decision. DSS makes decision for patient.   Pollie MeyerJ. Angelo A de Dios, MD 06/11/2016, 11:20 AM [place]  [facility] Pager [phone] After 3 pm or if no answer, call [phone]

## 2016-06-11 NOTE — Progress Notes (Signed)
CCM gave verbal order to pull ET tube back 3 cm. Tube was at 29@the  lip and is now at 26 @ the lip.

## 2016-06-11 NOTE — Progress Notes (Signed)
eLink Physician-Brief Progress Note Patient Name: Jeff ChurnJohn Handley DOB: [birth year] MRN: [id number]   Date of Service  06/11/2016  HPI/Events of Note  Persistent hypotension despite fluid bolus.  Is on precedex and now with bradycardia/hypotension  eICU Interventions  Plan: Switch to fentanyl gtt D/C precedex Monitor BP        Karma Hiney 06/11/2016, 5:53 AM

## 2016-06-11 NOTE — Progress Notes (Signed)
RN notified Elink MD for continuing hypotension despite fluid bolus and switch in sedation from precedex to fentanyl. Administering another 500 cc NS bolus and contacted RT to place arterial line. Fentanyl paused due to hypotension, pt currently tolerating vent. Will continue to monitor pt closely.

## 2016-06-11 NOTE — Progress Notes (Signed)
Physical Therapy Discharge Patient Details Name: Jeff ChurnJohn Foster MRN: [id number] DOB: 23-Jul-1960 Today's Date: 06/11/2016 Time:  -     Patient discharged from PT services secondary to medical decline - will need to re-order PT to resume therapy services.    GP     Sharen HeckHill, Ruba Outen Elizabeth Ignazio Kincaid PT [phone]  06/11/2016, 4:15 PM

## 2016-06-11 NOTE — Progress Notes (Signed)
eLink Physician-Brief Progress Note Patient Name: Jeff ChurnJohn Hubbert DOB: 1961/04/29 MRN: [id number]   Date of Service  06/11/2016  HPI/Events of Note  Oliguria - bladder scan with residual of 580 mL.  eICU Interventions  Will I/O cath x 1 now.      Intervention Category Intermediate Interventions: Oliguria - evaluation and management  Sommer,Steven Eugene 06/11/2016, 5:11 PM

## 2016-06-11 NOTE — Progress Notes (Signed)
Per MD order- RT collected sample and provided to RN with PT label and info per policy- left at bedside- RN aware.

## 2016-06-11 NOTE — Progress Notes (Signed)
Initial Nutrition Assessment  DOCUMENTATION CODES:   Not applicable  INTERVENTION:  - If pt to remain intubated >/= 24 hours, recommend 60 mL Prostat BID and Osmolite 1.5 @ 20 mL/hr to increase by 10 mL every 6 hours to reach goal rate of Osmolite 1.5 @ 50 mL/hr. At goal rate, this regimen will provide 2200 kcal, 135 grams of protein, and 914 mL free water.  - RD will follow-up 12/11.  NUTRITION DIAGNOSIS:   Inadequate oral intake related to inability to eat as evidenced by NPO status.  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 He has a legal guardian on file, but staff has been unable to get a hold of them. [facility] after presenting with altered mental status, drowsiness, and weakness. Workup in the ED was largely non-diagnostic. CXR and urine without obvious infectious source, MRI brain without obvious encephalopathic etiology. He was admitted by the hospitalist team. Hospital course has been complicated by intermittent agitation (severely aggressive with staff) and he has since developed what is suspected to be aspiration pneumonia on CXR and started on Zosyn. 12/7 his mental status declined and he became minimally responsive. Unable to protect airway. PCCM asked to see for possible intubation.  Pt seen for new vent. BMI indicates overweight status. OGT in place and is clamped at this time. No family/visitors present at time of RD visit. Bilateral mitten and soft ankle restraints in place. Per chart review, pt ate 50% of breakfast and 25% of lunch yesterday on Dysphagia 1, Heart Healthy diet.   Physical assessment shows no muscle or fat wasting at this time, no  edema. Per chart review, weight -6 lbs from 03/04/16-03/12/16 and he gained 9 lbs from 03/12/16-06/09/16. Will monitor weight trends closely during hospitalization.   Patient is currently intubated on ventilator support MV: 10.2 L/min Temp (24hrs), Avg:100.9 F (38.3 C), Min:99.5 F (37.5 C), Max:102.1 F (38.9 C) Propofol: none BP: 124/60 and MAP: 81   Medications reviewed; 20 mg IV Pepcid BID, 25 mcg Synthroid per OGT/day, PRN Zofran, 40 mEq oral KCl x2 doses yesterday.  Labs reviewed; Cl: 98 mmol/L, Ca: 8.8 mg/dL, AST elevated.   IVF: NS @ 100 mL/hr.  Drips: Fentanyl @ 50 mcg/hr.    Diet Order:   NPO  Skin:  Reviewed, no issues  Last BM:  PTA/unknown  Height:   Ht Readings from Last 1 Encounters:  06/10/16 6\' 2"  (1.88 m)    Weight:   Wt Readings from Last 1 Encounters:  06/09/16 222 lb 10.6 oz (101 kg)    Ideal Body Weight:  86.36 kg  BMI:  Body mass index is 28.59 kg/m.  Estimated Nutritional Needs:   Kcal:  2193 (90% [employer] Equation)  Protein:  121-151 grams (1.2-1.5 grams/kg)  Fluid:  2 L/day  EDUCATION NEEDS:   No education needs identified at this time    Trenton GammonJessica Chezney Huether, MS, RD, LDN, [facility] Inpatient Clinical Dietitian Pager # [phone] After hours/weekend pager # [phone]

## 2016-06-11 NOTE — Progress Notes (Signed)
Date:  June 11, 2016 Chart reviewed for concurrent status and case management needs. Will continue to follow patient progress. Remains on full vent support Discharge Planning: following for needs Expected discharge date: [id number] Marcelle SmilingRhonda Davis, BSN, [place] GateRN3, [place]M   [phone]

## 2016-06-11 NOTE — Progress Notes (Signed)
Palliative Care  Mr. Adriana SimasCook deteriorated overnight, with increased somnolence until he became minimally responsive and unable to protect airway that required intubation. Today, he is being started on NG tube feeds and a central line is being placed. He continues to remain febrile, now with intermittent hypotension requiring fluid bolus. I was able to reach Sander RadonHeather Hicks, who is covering for [name], and address Mr. Shiela MayerCooks rapid deterioration. I specifically covered that we needed direction on his goals of care, and clarification on if escalation of care (with pressors, central line, etc.) aligned with those goals. She is going to reach out to her director and Misty StanleyStacey and call me back ASAP.   Murrell ConverseSarah Greysin Medlen NP Palliative Care [phone]) [phone]

## 2016-06-11 NOTE — Progress Notes (Signed)
I saw and examined Mr. Shawgo this afternoon and discussed with Dr. Corrie Dandy as well as Charlynn Court from our team.    I also met with Bascom Levels (cell [phone]), who is the legal guardian, and we discussed Mr. Gille clinical course and the fact that continuing to escalate care and attempting resuscitation in the event of cardiac arrest are likely to cause additional suffering while not giving him a realistic chance of meaningful recovery.  We reviewed prior note (07/25/15) by Imogene Burn from our team when she met with patient's now deceased sister (Ms. Hortencia Conradi) where decision was made to establish DNR/DNI.    DSS requested paperwork documenting recommendations for Mr. Gasbarro's care with recommendation for no resuscitation at the time of death from 2 physicians that was completed by myself and Dr. Corrie Dandy.  Recommendation is for continuing current care, no escalation of care (invasive procedures, pressors, central line), and do not attempt resuscitation in the event of cardiac arrest.  This was faxed to requested number and I also discussed case with Barb Merino with approval for changing code status to Do Not Attempt Resuscitation.  Plan is for continuation of current therapies, no escalation of care, DNR in the event of arrest, and monitor clinical course through weekend.  Micheline Rough, MD [place] Team [phone]

## 2016-06-11 NOTE — Progress Notes (Signed)
eLink Physician-Brief Progress Note Patient Name: Jeff ChurnJohn Foster DOB: February 03, 1961 MRN: [id number]   Date of Service  06/11/2016  HPI/Events of Note  Hypotension with MAP of 37 68/27  eICU Interventions  NS bolus 500 cc IV for BP support     Intervention Category Intermediate Interventions: Hypotension - evaluation and management  Lanika Colgate 06/11/2016, 5:16 AM

## 2016-06-11 NOTE — Progress Notes (Signed)
  [facility] PCCM  Palliative Care team has been in communication with DSS re: pt's care. DSS will fax paper work re: pt's code status >> DNR, no escalation of care, no pressors, no invasive procedures, no PICC.  Palliative Care will sign off on paperwork and send to DSS. Once DSS gets signed off paper work, code status will be changed. Anticipate that will transpire today.   Greatly appreciate Palliative Care help !  J. Alexis FrockAngelo A de Dios, MD 06/11/2016, 1:48 PM [place] [facility] Pager [phone] After 3 pm or if no answer, call [phone]

## 2016-06-11 NOTE — Progress Notes (Signed)
RN alerted Elink MD about pt's persistent hypotension. 500 cc fluid bolus ordered and given. BP still 72/40 with MAP of 48. Elink MD notified again. Orders received to d/c precedex gtt and switch to fentanyl. Will continue to monitor pt closely.

## 2016-06-11 NOTE — Consult Note (Signed)
Consultation Note Date: 06/11/2016   Patient Name: Jeff Foster  DOB: Dec 01, 1960  MRN: [id number]  Age / Sex: 55 y.o., male  PCP: Vonna Drafts, FNP Referring Physician: Albertine Patricia, MD  Reason for Consultation: Establishing goals of care and Non pain symptom management  HPI/Patient Profile: 55 y.o. male  with past medical history of MR with intermittent explosive disorder, paranoid schizophrenia, seizures, dysphagia with chronic aspiration, and CVA who was admitted on 06/04/2016 after his group home noted he had new tremors, a fall, and altered mental status. Initial work-up was largely non-diagnostic with CXR and urine without obvious infectious source, MRI and CT brain without obvious encephalopathic etiology. He did become increasingly aggressive, which shifted into somnolence and becoming unresponsive after he developed what appears to be aspiration pneumonia. Despite abx, he continues to deteriorate and required intubation on 12/7.   Of note, Jeff Foster code status has changed multiple times this past year between Full Code and DNR. Additionally, when he had a stroke in 2015, his sister had pursued comfort care, only for him to recover. In January, when Palliative Care spoke with his sister (Jeff Foster), she verbalized a desire for no artifical feeding and DNR status. She related that Jeff Foster has been aspirating for over a decade, and wanted to allow pleasure eating with acknowledged risk. Mr. Hortencia Foster has died since that meeting, but had been Mr. Limb closest family member.   Clinical Assessment and Goals of Care: Jeff Foster is a ward of the state with a DSS appointed legal guardian, Nance Pew.I was able to connect with her today to review Mr. Erbes clinical course and his rapid deterioration with requirement of ventilator support. Ms. Arlie Solomons was clear that her goals for Jeff Foster are to promote quality  of life, and interventions should align with this. We discussed palliative's meeting with pt's sister back in January, who expressed the desire for her brother to be DNR and receive no artificial feeding, which Ms. Arlie Solomons also supports.  Primary Decision Maker LEGAL Gregary Signs [phone], personal cell)    [place] to continue current level of care and DO NOT ESCALATE CARE beyond the present interventions: No central line, no artificial feeds, no pressors; focus efforts on supporting Jeff Foster and on his quality of life. If he does not improve or further deteriorates, we will shift focus to comfort care only.  Jeff Foster has faxed [place] DSS paperwork for DNR; Dr. Domingo Cocking and Dr. Corrie Dandy will fill it out and fax it back  Code Status/Advance Care Planning:  Full code; Legal guardian working to obtain DNR with consent of DSS director  Symptom Management:   Secretions: Scheduled Robinul, in addition to current respiratory interventions  Pain: Continue Fentanyl infusion with PRN boluses  Palliative Prophylaxis:   Aspiration, Bowel Regimen, Frequent Pain Assessment, Oral Care and Turn Reposition  Additional Recommendations (Limitations, Scope, Preferences):  Full Scope Treatment; no escalation beyond current interventions  Psycho-social/Spiritual:   Desire for further Chaplaincy support:no  Additional Recommendations: TBD  Prognosis:   Unable to determine  Discharge Planning: To Be Determined      Primary Diagnoses: Present on Admission: . Encephalopathy acute . Hypothyroidism . Thrombocytopenia ([place]) . Aggressive behavior . Acute encephalopathy   I have reviewed the medical record, interviewed the patient and family, and examined the patient. The following aspects are pertinent.  Past Medical History:  Diagnosis Date  . Intermittent explosive disorder   . MR (mental retardation)    Moderate Intellectual Developmental Disability   . Paranoid schizophrenia ([place])   . Psychosis   . Seizure ([place])   . Stroke (cerebrum) [facility])    Social History   Social History  . Marital status: Unknown    Spouse name: N/A  . Number of children: N/A  . Years of education: N/A   Social History Main Topics  . Smoking status: Former Research scientist (life sciences)  . Smokeless tobacco: Never Used  . Alcohol use None  . Drug use: Unknown  . Sexual activity: Not Asked   Other Topics Concern  . None   Social History Narrative  . None   History reviewed. No pertinent family history. Scheduled Meds: . sodium chloride   Intravenous Once  . aspirin  81 mg Oral Daily  . chlorhexidine gluconate (MEDLINE KIT)  15 mL Mouth Rinse BID  . famotidine (PEPCID) IV  20 mg Intravenous Q12H  . feeding supplement (PRO-STAT SUGAR FREE 64)  30 mL Per Tube BID  . fentaNYL (SUBLIMAZE) injection  50 mcg Intravenous Once  . fluconazole (DIFLUCAN) IV  200 mg Intravenous Q24H  . glycopyrrolate  0.2 mg Intravenous QID  . ipratropium-albuterol  3 mL Nebulization TID  . levothyroxine  25 mcg Oral QAC breakfast  . mouth rinse  15 mL Mouth Rinse QID  . mirtazapine  15 mg Oral QHS  . oxcarbazepine  600 mg Oral BID  . piperacillin-tazobactam (ZOSYN)  IV  3.375 g Intravenous Q8H  . QUEtiapine  200 mg Oral QHS  . sertraline  100 mg Oral Daily  . trazodone  300 mg Oral QHS  . Valproate Sodium  500 mg Per Tube BID   Continuous Infusions: . sodium chloride 75 mL/hr at 06/11/16 0545  . feeding supplement (OSMOLITE 1.5 CAL)    . fentaNYL infusion INTRAVENOUS 100 mcg/hr (06/11/16 0920)   PRN Meds:.acetaminophen, fentaNYL, fentaNYL (SUBLIMAZE) injection, fentaNYL (SUBLIMAZE) injection, midazolam, midazolam, ondansetron **OR** ondansetron (ZOFRAN) IV Allergies  Allergen Reactions  . Other Other (See Comments)    Pt is allergic to chlorine and bleach.   Reaction:  Unknown   . Peanut-Containing Drug Products Diarrhea   Review of Systems: Unable to obtain as pt is  unresponsive  Physical Exam  Constitutional: He appears well-developed. He has a sickly appearance. He is intubated and restrained.  HENT:  Mouth/Throat: Mucous membranes are dry.  Neck: Normal range of motion.  Cardiovascular: Tachycardia present.   Pulmonary/Chest: He is intubated. He has decreased breath sounds in the right lower field and the left lower field. He has rhonchi (throughout).  Abdominal: Soft. Bowel sounds are normal.  Genitourinary:  Genitourinary Comments: Condom cath in place  Neurological: He is unresponsive.  Skin: Skin is warm and dry. There is pallor.  Psychiatric:  Intubated and unresponsive   Vital Signs: BP 119/61   Pulse (!) 113   Temp (!) 101.7 F (38.7 C) (Oral)   Resp 16   Ht 6' 2"  (1.88 m)   Wt 101 kg (222 lb 10.6 oz)  SpO2 92%   BMI 28.59 kg/m  Pain Assessment: CPOT   Pain Score: 0-No pain   SpO2: SpO2: 92 % O2 Device:SpO2: 92 % O2 Flow Rate: .O2 Flow Rate (L/min): 12 L/min  IO: Intake/output summary:  Intake/Output Summary (Last 24 hours) at 06/11/16 1132 Last data filed at 06/11/16 1000  Gross per 24 hour  Intake           3109.1 ml  Output              795 ml  Net           2314.1 ml    LBM: Last BM Date:  (UTA) Baseline Weight: Weight: 91.6 kg (202 lb) Most recent weight: Weight: 101 kg (222 lb 10.6 oz)      Flowsheet Rows   Flowsheet Row Most Recent Value  Intake Tab  Referral Department  Hospitalist  Unit at Time of Referral  ICU  Palliative Care Primary Diagnosis  Neurology  Date Notified  06/09/16  Palliative Care Type  Return patient Palliative Care  Reason for referral  Clarify Goals of Care  Date of Admission  06/18/2016  # of days IP prior to Palliative referral  2  Clinical Assessment  Psychosocial & Spiritual Assessment  Palliative Care Outcomes      Time In: 1050 Time Out: 1220 Time Total: 90 minutes Greater than 50%  of this time was spent counseling and coordinating care related to the above  assessment and plan.  Signed by: Charlynn Court, NP Palliative Medicine Team [phone] (7a-5p) Team Phone # [phone] (Nights/Weekends)

## 2016-06-11 NOTE — Progress Notes (Signed)
PT Cancellation Note  Patient Details Name: Jeff Foster MRN: [id number] DOB: [birth year]   Cancelled Treatment:    Reason Eval/Treat Not Completed: Medical issues which prohibited therapy. Hypotension. Will check back another day.   [name], [name] 06/11/2016, 7:04 AM [name] PT [phone]

## 2016-06-11 NOTE — Progress Notes (Signed)
At 1000 RN found pt with spO2 in 80s, copious thick secretions in throat and suctioned. RT advanced pt to NRB. Pt had no gag reflex with suctioning and pt could not protect airway. Pt increasingly somnolent since beginning of shift with no anti anxiety/narcotics since 1630 06/11/16. NP called to bedside. CCM consulted and decision was made to intubate to protect pt's airway. Pt intubated without complication. Will continue to monitor closely.

## 2016-06-11 NOTE — Progress Notes (Signed)
eLink Physician-Brief Progress Note Patient Name: Jeff ChurnJohn Foster DOB: 1961/01/03 MRN: [id number]   Date of Service  06/11/2016  HPI/Events of Note  Persistent hypotension with MAP of 47, 74/37.  Precedex d/ced earlier.  Now on Fentanyl gtt  eICU Interventions  Plan: 500 cc NS bolus Insert aline to verify BP Continue to monitor BP     Intervention Category Intermediate Interventions: Hypotension - evaluation and management  DETERDING,ELIZABETH 06/11/2016, 6:23 AM

## 2016-06-12 ENCOUNTER — Inpatient Hospital Stay (HOSPITAL_COMMUNITY): Payer: Medicare Other

## 2016-06-12 DIAGNOSIS — N179 Acute kidney failure, unspecified: Secondary | ICD-10-CM

## 2016-06-12 LAB — BASIC METABOLIC PANEL
ANION GAP: 8 (ref 5–15)
BUN: 23 mg/dL — ABNORMAL HIGH (ref 6–20)
CALCIUM: 8.5 mg/dL — AB (ref 8.9–10.3)
CO2: 27 mmol/L (ref 22–32)
CREATININE: 1.01 mg/dL (ref 0.61–1.24)
Chloride: 101 mmol/L (ref 101–111)
GFR calc Af Amer: >60 mL/min (ref >60)
GFR calc non Af Amer: >60 mL/min (ref >60)
GLUCOSE: 101 mg/dL — AB (ref 65–99)
Potassium: 4.1 mmol/L (ref 3.5–5.1)
Sodium: 136 mmol/L (ref 135–145)

## 2016-06-12 LAB — CBC
HCT: 28.1 % — ABNORMAL LOW (ref 39.0–52.0)
HEMOGLOBIN: 9.2 g/dL — AB (ref 13.0–17.0)
MCH: 26.9 pg (ref 26.0–34.0)
MCHC: 32.7 g/dL (ref 30.0–36.0)
MCV: 82.2 fL (ref 78.0–100.0)
Platelets: 56 10*3/uL — ABNORMAL LOW (ref 150–400)
RBC: 3.42 MIL/uL — ABNORMAL LOW (ref 4.22–5.81)
RDW: 15.7 % — ABNORMAL HIGH (ref 11.5–15.5)
WBC: 12.4 10*3/uL — ABNORMAL HIGH (ref 4.0–10.5)

## 2016-06-12 LAB — GLUCOSE, CAPILLARY
GLUCOSE-CAPILLARY: 103 mg/dL — AB (ref 65–99)
GLUCOSE-CAPILLARY: 147 mg/dL — AB (ref 65–99)
GLUCOSE-CAPILLARY: 99 mg/dL (ref 65–99)
Glucose-Capillary: 96 mg/dL (ref 65–99)
Glucose-Capillary: 124 mg/dL — ABNORMAL HIGH (ref 65–99)
Glucose-Capillary: 136 mg/dL — ABNORMAL HIGH (ref 65–99)

## 2016-06-12 LAB — PHOSPHORUS
Phosphorus: 3.6 mg/dL (ref 2.5–4.6)
Phosphorus: 2.9 mg/dL (ref 2.5–4.6)

## 2016-06-12 LAB — MAGNESIUM
Magnesium: 1.8 mg/dL (ref 1.7–2.4)
Magnesium: 2.3 mg/dL (ref 1.7–2.4)

## 2016-06-12 LAB — PROCALCITONIN: Procalcitonin: 0.36 ng/mL

## 2016-06-12 MED ORDER — OSMOLITE 1.5 CAL PO LIQD
1000.0000 mL | ORAL | Status: DC
  Administered 2016-06-12 – 2016-06-13 (×2): 1000 mL
  Filled 2016-06-12 (×4): qty 1000

## 2016-06-12 MED ORDER — SODIUM CHLORIDE 0.9 % IV BOLUS (SEPSIS)
2000.0000 mL | Freq: Once | INTRAVENOUS | Status: AC
  Administered 2016-06-12: 1000 mL via INTRAVENOUS

## 2016-06-12 MED ORDER — PRO-STAT SUGAR FREE PO LIQD
60.0000 mL | Freq: Two times a day (BID) | ORAL | Status: DC
  Administered 2016-06-12 – 2016-06-15 (×7): 60 mL
  Filled 2016-06-12 (×7): qty 60

## 2016-06-12 MED ORDER — MAGNESIUM SULFATE 2 GM/50ML IV SOLN
2.0000 g | Freq: Once | INTRAVENOUS | Status: AC
  Administered 2016-06-12: 2 g via INTRAVENOUS
  Filled 2016-06-12: qty 50

## 2016-06-12 MED ORDER — VANCOMYCIN HCL IN DEXTROSE 1-5 GM/200ML-% IV SOLN
1000.0000 mg | Freq: Three times a day (TID) | INTRAVENOUS | Status: DC
  Administered 2016-06-12 – 2016-06-14 (×5): 1000 mg via INTRAVENOUS
  Filled 2016-06-12 (×5): qty 200

## 2016-06-12 MED ORDER — IBUPROFEN 100 MG/5ML PO SUSP
400.0000 mg | Freq: Once | ORAL | Status: AC
  Administered 2016-06-12: 400 mg
  Filled 2016-06-12: qty 20

## 2016-06-12 NOTE — Progress Notes (Signed)
RT advanced ET tube by 2 cm. Tube was at 24 @ the lip and is now secured at 26 @ the lip where it was orginally. RN notified and chest x Jeslin Bazinet will be done to confirm placement. RT will continue to monitor.

## 2016-06-12 NOTE — Progress Notes (Signed)
Stopped by pt bedside and had prayer. Pt was resting, intubated and alone, Please page if additional assistance is needed. Chaplain Marjory LiesPamela Carrington Holder, M.Div.   06/12/16 1400  Clinical Encounter Type  Visited With Other (Comment)

## 2016-06-12 NOTE — Progress Notes (Signed)
Brief Nutrition Note  Consult received for enteral/tube feeding initiation and management.   Patient was assessed by RD yesterday and had recommended:  60 mL Prostat BID and Osmolite 1.5 @ 20 mL/hr  increase by 10 mL every 6 hours to reach goal rate of Osmolite 1.5 @ 50 mL/hr. At goal rate, this regimen will provide 2200 kcal, 135 grams of protein, and 914 mL free water.   Will implement above recommendations.   Admitting Dx: Transient alteration of awareness [R40.4] Delirium [R41.0] Tremor [R25.1]  Body mass index is 27.99 kg/m. Pt meets criteria for overweight based on current BMI.  Labs:   Recent Labs Lab 06/10/16 2259 06/11/16 0327 06/11/16 1222 06/11/16 1717 06/12/16 0348  NA 134* 136  --   --  136  K 4.1 4.4  --   --  4.1  CL 97* 98*  --   --  101  CO2 30 29  --   --  27  BUN 14 15  --   --  23*  CREATININE 0.83 0.95  --   --  1.01  CALCIUM 8.7* 8.8*  --   --  8.5*  MG  --   --  1.7 1.7 1.8  PHOS  --   --  3.6 3.3 3.6  GLUCOSE 128* 112*  --   --  101*    Christophe LouisNathan Franks RD, LDN, CNSC Clinical Nutrition Pager: [phone] 06/12/2016 9:07 AM

## 2016-06-12 NOTE — Progress Notes (Signed)
PULMONARY / CRITICAL CARE MEDICINE   Name: Jeff Foster MRN: [id number] DOB: Jun 09, 1961    ADMISSION DATE:  06/14/2016 CONSULTATION DATE:  06/10/2016  REFERRING MD:  Dr. 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 He has a legal guardian on file, but staff has been unable to get a hold of them. [facility] after presenting with altered mental status, drowsiness, and weakness. Workup in the ED was largely non-diagnostic. CXR and urine without obvious infectious source, MRI brain without obvious encephalopathic etiology. He was admitted by the hospitalist team. Hospital course has been complicated by intermittent agitation (severely aggressive with staff) and he has since developed what is suspected to be aspiration pneumonia on CXR and started on Zosyn. 12/7 his mental status declined and he became minimally responsive. Unable to protect airway. PCCM asked to see for possible intubation.   EVENTS  06/10/16 : Sedated or agitated   SUBJECTIVE/OVERNIGHT/INTERVAL HX 06/11/16 - wua off sedatopm - moved all 4s and got agitated. Did not follow commands. Needing 70% fio2, peep 5. Oliguric - bladder scan ->  Now full DNR without vasopressors or escalation but continue current care without cvl placement   VITAL SIGNS: BP (!) 123/52   Pulse (!) 113   Temp (!) 100.5 F (38.1 C) (Oral)   Resp 16   Ht 6\' 2"  (1.88 m)   Wt 98.9 kg (218 lb 0.6 oz)   SpO2 95%   BMI 27.99 kg/m   HEMODYNAMICS:    VENTILATOR SETTINGS: Vent Mode: PRVC FiO2 (%):  [40 %-70 %] 70 % Set Rate:  [16 bmp] 16 bmp Vt Set:  [409[660 mL] 660 mL PEEP:  [5 cmH20] 5 cmH20 Plateau Pressure:  [16 cmH20-20 cmH20] 17 cmH20  INTAKE / OUTPUT: I/O last 3 completed shifts: In: 4000.5 [I.V.:2905.5; NG/GT:240; IV Piggyback:855] Out: 1350  [Urine:1350]  PHYSICAL EXAMINATION: General:  Middle aged intubated and agitated Neuro: Agitated, maex 4, lashes out when stimulated. pn Aleen SellsWUA,. Currently RASS -2 HEENT: [place]/AT, PERRL, no JVD Cardiovascular:  Tachy, regular, no MRG Lungs: Coarse bilateral, sync with vent on fent gtt Abdomen: Soft, non-tender, non-distended Musculoskeletal:  No acute deformity Skin:  Grossly intact  LABS:  BMET  Recent Labs Lab 06/10/16 2259 06/11/16 0327 06/12/16 0348  NA 134* 136 136  K 4.1 4.4 4.1  CL 97* 98* 101  CO2 30 29 27   BUN 14 15 23*  CREATININE 0.83 0.95 1.01  GLUCOSE 128* 112* 101*    Electrolytes  Recent Labs Lab 06/10/16 2259 06/11/16 0327 06/11/16 1222 06/11/16 1717 06/12/16 0348  CALCIUM 8.7* 8.8*  --   --  8.5*  MG  --   --  1.7 1.7 1.8  PHOS  --   --  3.6 3.3 3.6    CBC  Recent Labs Lab 06/10/16 2259 06/11/16 0327 06/12/16 0348  WBC 16.2* 20.6* 12.4*  HGB 10.6* 8.5* 9.2*  HCT 31.7* 25.6* 28.1*  PLT 49* 72* 56*    Coag's No results for input(s): APTT, INR in the last 168 hours.  Sepsis Markers  Recent Labs Lab May 28, 2016 1150 06/10/16 2259 06/11/16 0327 06/12/16 0348  LATICACIDVEN 1.43 1.0  --   --   PROCALCITON  --   --  0.35 0.36    ABG  Recent Labs Lab 06/10/16 2300 06/11/16 0100  PHART 7.339* 7.403  PCO2ART 59.6* 49.6*  PO2ART 70.6* 143*    Liver Enzymes  Recent Labs Lab 06/10/16 2259  AST 80*  ALT 18  ALKPHOS 69  BILITOT 0.9  ALBUMIN 3.3*    Cardiac Enzymes No results for input(s): TROPONINI, PROBNP in the last 168 hours.  Glucose  Recent Labs Lab 06/11/16 1701 06/11/16 2014 06/11/16 2316 06/12/16 0354 06/12/16 0740  GLUCAP 111* 92 101* 96 99    Imaging Dg Chest Port 1 View  Result Date: 06/12/2016 CLINICAL DATA:  Initial evaluation for acute respiratory failure. EXAM: PORTABLE CHEST 1 VIEW COMPARISON:  Prior radiograph from 06/11/2016. FINDINGS: Patient remains intubated with the tip of an endotracheal  tube position 1.5 cm above the carina. Enteric tube courses in the the abdomen. Transverse heart size within normal limits. Allowing for patient rotation, mediastinal silhouette felt to be within normal limits. Mild prominence of the main pulmonary arteries is similar. Lungs hypoinflated. Bibasilar airspace opacities are slightly worsened from prior study, which may reflect worsened atelectasis or infiltrates. New patchy densities within the peripheral right upper lobe as well. No pulmonary edema. No definite pleural effusion. No pneumothorax. Osseous structures unchanged. IMPRESSION: 1. Tip of the endotracheal tube 1.5 cm above the carina. 2. Shallow lung inflation with interval worsening and bibasilar airspace opacities, with new opacity within the peripheral right upper lobe. Findings may reflect progressive infiltrates or atelectasis. Electronically Signed   By: Rise Mu M.D.   On: 06/12/2016 05:55     STUDIES:  MRI brain 12/4 > no evidence acute infarct or hemorrhage, moderate brain parenchymal volume loss (stable).  CULTURES: Blood 12/5 >>>  Sputum 12/8 >>  ANTIBIOTICS: Zosyn 12/7 > Fluconazole 12/7 >  SIGNIFICANT EVENTS: 12/4 - admit 12/7 - Intubated for airway protection  LINES/TUBES: ETT 12/7 >  DISCUSSION: Agitated and requiring sedation.  ASSESSMENT / PLAN:  PULMONARY A: Acute hypoxemic respiratory failure secondary to aspiration PNA   06/12/2016 - needing 70% fio2  P:    intubation and full vent support Vent bundle Pulmonary toilet  CARDIOVASCULAR A:  No acute issues  P:  Telemetry monitoring HD montoring  RENAL  Recent Labs Lab 06/08/16 0454 06/09/16 0634 06/10/16 2259 06/11/16 0327 06/12/16 0348  CREATININE 0.68 0.80 0.83 0.95 1.01      A:  AKI - doubling of creatin on 06/12/16 since 06/08/16  P:   2L bolus and reassess Monitor Gentle IVF  BMP  Foley  GASTROINTESTINAL A:   Dysphagia - chronic  P:   NPO Start  TF  HEMATOLOGIC  Recent Labs  06/11/16 0327 06/12/16 0348  HGB 8.5* 9.2*  Plt 46->72  A:   Anemia - of chronic dz and critical illness Thrombocytopenia  P:  Follow CBC SCDs Hold heparin due to low plt. Decreasing. Await  HIT panel from 06/11/16  INFECTIOUS Results for orders placed or performed during the hospital encounter of 06/29/2016  Culture, blood (Routine X 2) w Reflex to ID Panel     Status: None (Preliminary result)   Collection Time: 06/08/16  3:04 PM  Result Value Ref Range Status   Specimen Description BLOOD LEFT HAND  Final   Special Requests IN PEDIATRIC BOTTLE 2.5 CC  Final   Culture   Final    NO GROWTH 3 DAYS Performed at [facility]    Report Status PENDING  Incomplete  Culture, blood (Routine X 2) w Reflex to ID Panel     Status: None (Preliminary result)   Collection Time: 06/08/16  3:04 PM  Result Value Ref Range Status   Specimen Description BLOOD RIGHT ARM  Final   Special Requests BOTTLES DRAWN AEROBIC AND ANAEROBIC 10 CC  Final   Culture   Final    NO GROWTH 3 DAYS Performed at [facility]    Report Status PENDING  Incomplete  MRSA PCR Screening     Status: None   Collection Time: 06/10/16 12:37 AM  Result Value Ref Range Status   MRSA by PCR NEGATIVE NEGATIVE Final    Comment:        The GeneXpert MRSA Assay (FDA approved for NASAL specimens only), is one component of a comprehensive MRSA colonization surveillance program. It is not intended to diagnose MRSA infection nor to guide or monitor treatment for MRSA infections.   Culture, respiratory (NON-Expectorated)     Status: None (Preliminary result)   Collection Time: 06/11/16  9:31 AM  Result Value Ref Range Status   Specimen Description TRACHEAL ASPIRATE  Final   Special Requests NONE  Final   Gram Stain   Final    ABUNDANT WBC PRESENT, PREDOMINANTLY PMN NO ORGANISMS SEEN Performed at [facility]    Culture PENDING  Incomplete   Report Status PENDING   Incomplete    A:   Aspiration PNA  P:   Anti-infectives    Start     Dose/Rate Route Frequency Ordered Stop   06/09/16 2200  piperacillin-tazobactam (ZOSYN) IVPB 3.375 g     3.375 g 12.5 mL/hr over 240 Minutes Intravenous Every 8 hours 06/09/16 1515     06/09/16 1600  piperacillin-tazobactam (ZOSYN) IVPB 3.375 g     3.375 g 100 mL/hr over 30 Minutes Intravenous  Once 06/09/16 1515 06/09/16 1629   06/09/16 1500  fluconazole (DIFLUCAN) IVPB 200 mg     200 mg 100 mL/hr over 60 Minutes Intravenous Every 24 hours 06/09/16 1429        ENDOCRINE A:   No acute issues  P:   Follow glucose  NEUROLOGIC A:   Intellectual disability Paranoid shizophrenia   - agitated on WUA.  P:   RASS goal: -1 to -2 Precedex infusion, switched to fentanyl 12/8 due to hypotension PRN fentanyl Hold sedating meds (thorazine, gabapentin, haldol, hydroxizine) Continue depakote and synthroid via tube Mental status will impeded extubation.   FAMILY  - Updates: DSS sent paper wiork 06/11/16 - full DNAR without escalation but continue current care (no cvl or vasopressors)  - Inter-disciplinary family meet or Palliative Care meeting due by:  12/14    The patient is critically ill with multiple organ systems failure and requires high complexity decision making for assessment and support, frequent evaluation and titration of therapies, application of advanced monitoring technologies and extensive interpretation of multiple databases.   Critical Care Time devoted to patient care services described in this note is  30  Minutes. This time reflects time of care of this signee Dr Kalman ShanMurali Kaye Mitro. This critical care time does not reflect procedure time, or teaching time or supervisory time of PA/NP/Med student/Med Resident etc but could involve care discussion time    Dr. Kalman ShanMurali Orval Dortch, M.D., [facility].C.P Pulmonary and Critical Care Medicine Staff Physician [facility] [place] [facility] and  Critical Care Pager: [phone], If no answer or between  15:00h - 7:00h: call [phone]  06/12/2016 8:37 AM

## 2016-06-12 NOTE — Progress Notes (Signed)
eLink Physician-Brief Progress Note Patient Name: Jeff Foster DOB: 09/05/1960 MRN: [id number]   Date of Service  06/12/2016  HPI/Events of Note  Fever to 102.9 F - Tylenol already ordered. Creatinine increased from baseline. Currently on empiric Abx Rx with Zosyn.   eICU Interventions  Will order: 1. Blood cultures X 2 now.  2. Motrin liquid 400 mg PO now.  3. Add Vancomycin per pharmacy consultation.       Intervention Category Major Interventions: Infection - evaluation and management  Sommer,Steven Eugene 06/12/2016, 10:18 PM

## 2016-06-12 NOTE — Progress Notes (Signed)
Pharmacy Antibiotic Note  Genene ChurnJohn Lutze is a 55 y.o. male with PMH of MR, intermittent explosive behavior, psychosis, seizure disorder, CVA, dysphagia, hypothyroidism, anemia, and thrombocytopenia; admitted on 06/16/2016 with possible aspiration pneumonia.  Pharmacy has been consulted for Zosyn dosing.  Today, 06/12/2016 Day 4 Zosyn, Fluconazole Remains febrile, WBC decreasing, renal fx wnl No change abx, antifungal  Plan:  Zosyn 3.375 g IV q8 hrs by 4-hr infusion  Fluconazole 200mg  IV daily for thrush per MD  Follow clinical course, renal function, culture results as available  Follow for de-escalation of antibiotics and LOT  Height: 6\' 2"  (188 cm) Weight: 218 lb 0.6 oz (98.9 kg) IBW/kg (Calculated) : 82.2  Temp (24hrs), Avg:101.5 F (38.6 C), Min:100.4 F (38 C), Max:103 F (39.4 C)   Recent Labs Lab 05-14-2016 1150 06/08/16 0454 06/09/16 0634 06/10/16 2259 06/11/16 0327 06/12/16 0348  WBC  --  6.9 8.5 16.2* 20.6* 12.4*  CREATININE  --  0.68 0.80 0.83 0.95 1.01  LATICACIDVEN 1.43  --   --  1.0  --   --     Estimated Creatinine Clearance: 102.7 mL/min (by C-G formula based on SCr of 1.01 mg/dL).    Allergies  Allergen Reactions  . Other Other (See Comments)    Pt is allergic to chlorine and bleach.   Reaction:  Unknown   . Peanut-Containing Drug Products Diarrhea   Antimicrobials this admission: Zosyn 12/6 >>  Diflucan (per MD for thrush) 12/6 >>   Dose adjustments this admission:  Microbiology results: 12/5 BCx: NGTD 12/7 MRSA PCR: neg 12/8 Trach asp: ngtd  Thank you for allowing pharmacy to be a part of this patient's care.  Otho BellowsGreen, Annagrace Carr L PharmD Pager [phone] 06/12/2016, 9:44 AM

## 2016-06-13 LAB — CULTURE, BLOOD (ROUTINE X 2)
CULTURE: NO GROWTH
Culture: NO GROWTH

## 2016-06-13 LAB — GLUCOSE, CAPILLARY
Glucose-Capillary: 117 mg/dL — ABNORMAL HIGH (ref 65–99)
Glucose-Capillary: 130 mg/dL — ABNORMAL HIGH (ref 65–99)
Glucose-Capillary: 161 mg/dL — ABNORMAL HIGH (ref 65–99)
Glucose-Capillary: 172 mg/dL — ABNORMAL HIGH (ref 65–99)
Glucose-Capillary: 145 mg/dL — ABNORMAL HIGH (ref 65–99)
Glucose-Capillary: 230 mg/dL — ABNORMAL HIGH (ref 65–99)

## 2016-06-13 LAB — PROCALCITONIN: PROCALCITONIN: 0.29 ng/mL

## 2016-06-13 MED ORDER — DEXMEDETOMIDINE HCL IN NACL 200 MCG/50ML IV SOLN
0.4000 ug/kg/h | INTRAVENOUS | Status: DC
  Administered 2016-06-13 – 2016-06-14 (×5): 0.6 ug/kg/h via INTRAVENOUS
  Administered 2016-06-14 (×3): 0.3 ug/kg/h via INTRAVENOUS
  Administered 2016-06-15 (×2): 0.4 ug/kg/h via INTRAVENOUS
  Administered 2016-06-15: 0.5 ug/kg/h via INTRAVENOUS
  Administered 2016-06-15: 0.4 ug/kg/h via INTRAVENOUS
  Administered 2016-06-16: 0.3 ug/kg/h via INTRAVENOUS
  Filled 2016-06-13 (×14): qty 50

## 2016-06-13 MED ORDER — SODIUM CHLORIDE 0.9 % IV BOLUS (SEPSIS)
500.0000 mL | Freq: Once | INTRAVENOUS | Status: AC
  Administered 2016-06-13: 500 mL via INTRAVENOUS

## 2016-06-13 NOTE — Progress Notes (Signed)
CPT held due to Pt agitation at this time.  RT to monitor and assess as needed.

## 2016-06-13 NOTE — Progress Notes (Signed)
Pt CBG 230. Tube feeds currently off. E-Link made aware. No coverage ordered at this time. Will continue to monitor.

## 2016-06-13 NOTE — Progress Notes (Signed)
PULMONARY / CRITICAL CARE MEDICINE   Name: Jeff ChurnJohn Foster MRN: [id number] DOB: [birth year]    ADMISSION DATE:  07/03/2016 CONSULTATION DATE:  06/10/2016  REFERRING MD:  Dr. 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 He has a legal guardian on file, but staff has been unable to get a hold of them. [facility] after presenting with altered mental status, drowsiness, and weakness. Workup in the ED was largely non-diagnostic. CXR and urine without obvious infectious source, MRI brain without obvious encephalopathic etiology. He was admitted by the hospitalist team. Hospital course has been complicated by intermittent agitation (severely aggressive with staff) and he has since developed what is suspected to be aspiration pneumonia on CXR and started on Zosyn. 12/7 his mental status declined and he became minimally responsive. Unable to protect airway. PCCM asked to see for possible intubation.   EVENTS 12/4 - admit 12/7 - Intubated for airway protection MRI brain 12/4 > no evidence acute infarct or hemorrhage, moderate brain parenchymal volume loss (stable). 06/10/16 : Sedated or agitated    06/12/16 - wua off sedatopm - moved all 4s and got agitated. Did not follow commands. Needing 70% fio2, peep 5. Oliguric - bladder scan ->  Now full DNR without vasopressors or escalation but continue current care without cvl placement    SUBJECTIVE/OVERNIGHT/INTERVAL HX 12/ 10 - agitated signifciantly  On fent gtt  And versed prn. Febrile 103F and vanc added  VITAL SIGNS: BP 126/69   Pulse (!) 55   Temp 97.9 F (36.6 C) (Oral)   Resp 20   Ht 6\' 2"  (1.88 m)   Wt 101.9 kg (224 lb 10.4 oz)   SpO2 99%   BMI 28.84 kg/m   HEMODYNAMICS:    VENTILATOR SETTINGS: Vent Mode: PRVC FiO2 (%):  [40 %-70 %] 40 % Set Rate:  [16  bmp] 16 bmp Vt Set:  [660 mL] 660 mL PEEP:  [5 cmH20] 5 cmH20 Plateau Pressure:  [11 cmH20-21 cmH20] 11 cmH20  INTAKE / OUTPUT: I/O last 3 completed shifts: In: 9206.9 [I.V.:3226.9; NG/GT:1130; IV Piggyback:4850] Out: 1750 [Urine:1750]  PHYSICAL EXAMINATION: General:  Middle aged intubated and agitated Neuro: Agitated, maex 4, lashes out when stimulated. pn Aleen SellsWUA,. Currently RASS -2 HEENT: [place]/AT, PERRL, no JVD Cardiovascular:  Tachy, regular, no MRG Lungs: Coarse bilateral, sync with vent on fent gtt Abdomen: Soft, non-tender, non-distended Musculoskeletal:  No acute deformity Skin:  Grossly intact  LABS:  BMET  Recent Labs Lab 06/10/16 2259 06/11/16 0327 06/12/16 0348  NA 134* 136 136  K 4.1 4.4 4.1  CL 97* 98* 101  CO2 30 29 27   BUN 14 15 23*  CREATININE 0.83 0.95 1.01  GLUCOSE 128* 112* 101*    Electrolytes  Recent Labs Lab 06/10/16 2259 06/11/16 0327  06/11/16 1717 06/12/16 0348 06/12/16 1649  CALCIUM 8.7* 8.8*  --   --  8.5*  --   MG  --   --   < > 1.7 1.8 2.3  PHOS  --   --   < > 3.3 3.6 2.9  < > = values in this interval not displayed.  CBC  Recent Labs Lab 06/10/16 2259 06/11/16 0327 06/12/16 0348  WBC 16.2* 20.6* 12.4*  HGB 10.6* 8.5* 9.2*  HCT 31.7* 25.6* 28.1*  PLT 49* 72* 56*    Coag's No  results for input(s): APTT, INR in the last 168 hours.  Sepsis Markers  Recent Labs Lab 06/14/2016 1150 06/10/16 2259 06/11/16 0327 06/12/16 0348 06/13/16 0328  LATICACIDVEN 1.43 1.0  --   --   --   PROCALCITON  --   --  0.35 0.36 0.29    ABG  Recent Labs Lab 06/10/16 2300 06/11/16 0100  PHART 7.339* 7.403  PCO2ART 59.6* 49.6*  PO2ART 70.6* 143*    Liver Enzymes  Recent Labs Lab 06/10/16 2259  AST 80*  ALT 18  ALKPHOS 69  BILITOT 0.9  ALBUMIN 3.3*    Cardiac Enzymes No results for input(s): TROPONINI, PROBNP in the last 168 hours.  Glucose  Recent Labs Lab 06/12/16 1131 06/12/16 1551 06/12/16 1953 06/12/16 2332  06/13/16 0422 06/13/16 0725  GLUCAP 103* 124* 136* 147* 117* 130*    Imaging No results found.   ASSESSMENT / PLAN:  PULMONARY A: Acute hypoxemic respiratory failure secondary to aspiration PNA   06/13/2016 - needing 40% fio2 and improved. Agitation precluding sbt  P:    intubation and full vent support Vent bundle Pulmonary toilet  CARDIOVASCULAR A:  No acute issues  P:  Telemetry monitoring HD montoring  RENAL  Recent Labs Lab 06/08/16 0454 06/09/16 0634 06/10/16 2259 06/11/16 0327 06/12/16 0348  CREATININE 0.68 0.80 0.83 0.95 1.01      A:  AKI - doubling of creatin on 06/13/16 since 06/08/16 - despite 2L bolus 06/12/16. Ur OP falling  P:   Avoid nephrotoxins to extent possible 2L bolus and reassess Monitor Gentle IVF  BMP  Foley  GASTROINTESTINAL A:   Dysphagia - chronic  P:   NPO on TF  HEMATOLOGIC  Recent Labs Lab 06/10/16 2259 06/11/16 0327 06/12/16 0348  HGB 10.6* 8.5* 9.2*  HCT 31.7* 25.6* 28.1*  WBC 16.2* 20.6* 12.4*  PLT 49* 72* 56*    A:   Anemia - of chronic dz and critical illness Thrombocytopenia  P:  Follow CBC SCDs Hold heparin due to low plt. Decreasing. Await  HIT panel from 06/11/16  INFECTIOUS   A:   Aspiration PNA + ongoing fevers  P:   Anti-infectives    Start     Dose/Rate Route Frequency Ordered Stop   06/12/16 2245  vancomycin (VANCOCIN) IVPB 1000 mg/200 mL premix     1,000 mg 200 mL/hr over 60 Minutes Intravenous Every 8 hours 06/12/16 2243     06/09/16 2200  piperacillin-tazobactam (ZOSYN) IVPB 3.375 g     3.375 g 12.5 mL/hr over 240 Minutes Intravenous Every 8 hours 06/09/16 1515     06/09/16 1600  piperacillin-tazobactam (ZOSYN) IVPB 3.375 g     3.375 g 100 mL/hr over 30 Minutes Intravenous  Once 06/09/16 1515 06/09/16 1629   06/09/16 1500  fluconazole (DIFLUCAN) IVPB 200 mg     200 mg 100 mL/hr over 60 Minutes Intravenous Every 24 hours 06/09/16 1429        ENDOCRINE A:   No acute  issues  P:   Follow glucose  NEUROLOGIC A:   Intellectual disability Paranoid shizophrenia   - agitated on WUA.  P:   RASS goal: -1 to -2 rstart Precedex infusion,  switched to fentanyl gtt 12/8 due to hypotension PRN fentanyl Hold sedating meds (thorazine, gabapentin, haldol, hydroxizine) Continue depakote and synthroid via tube Mental status will impeded extubation.   FAMILY  - Updates: DSS sent paper wiork 06/11/16 - full DNAR without escalation but continue current care (no cvl or  vasopressors) towads medical extubation and improvement. If fails, then terminal wean  - Inter-disciplinary family meet or Palliative Care meeting due by:  12/14    The patient is critically ill with multiple organ systems failure and requires high complexity decision making for assessment and support, frequent evaluation and titration of therapies, application of advanced monitoring technologies and extensive interpretation of multiple databases.   Critical Care Time devoted to patient care services described in this note is  30  Minutes. This time reflects time of care of this signee Dr Kalman Shan. This critical care time does not reflect procedure time, or teaching time or supervisory time of PA/NP/Med student/Med Resident etc but could involve care discussion time    Dr. Kalman Shan, M.D., [facility].C.P Pulmonary and Critical Care Medicine Staff Physician [facility] [place] [facility] Pager: [phone], If no answer or between  15:00h - 7:00h: call [phone]  06/13/2016 8:37 AM

## 2016-06-13 NOTE — Progress Notes (Signed)
Nutrition Follow-up  INTERVENTION:   Continue Osmolite 1.5 @ 50 mL/hr and 60 ml Prostat BID.  This regimen will provide 2200 kcal, 135 grams of protein, and 914 mL free water.   RD will continue to follow  NUTRITION DIAGNOSIS:   Inadequate oral intake related to inability to eat as evidenced by NPO status.  Ongoing.  GOAL:   Patient will meet greater than or equal to 90% of their needs  Meeting.  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 He has a legal guardian on file, but staff has been unable to get a hold of them. [facility] after presenting with altered mental status, drowsiness, and weakness. Workup in the ED was largely non-diagnostic. CXR and urine without obvious infectious source, MRI brain without obvious encephalopathic etiology. He was admitted by the hospitalist team. Hospital course has been complicated by intermittent agitation (severely aggressive with staff) and he has since developed what is suspected to be aspiration pneumonia on CXR and started on Zosyn. 12/7 his mental status declined and he became minimally responsive. Unable to protect airway. PCCM asked to see for possible intubation.  12/9: TF protocol was ordered and recommendations from initial assessment were implemented.  12/10: Patient is receiving Osmolite 1.5 @ 50 ml/hr with 60 ml Prostat BID via OGT which is meeting 95% of kcal and 100% of protein needs.  Patient is currently intubated on ventilator support MV: 11.9 L/min Temp (24hrs), Avg:100.8 F (38.2 C), Min:97.9 F (36.6 C), Max:103 F (39.4 C)  Labs reviewed: CBGs: 130-161 Mg/Phos WNL   12/8: -OGT in place and is clamped at this time. No family/visitors present at time of RD visit. Bilateral mitten and  soft ankle restraints in place. Per chart review, pt ate 50% of breakfast and 25% of lunch yesterday on Dysphagia 1, Heart Healthy diet.  -Physical assessment shows no muscle or fat wasting at this time, no edema. Per chart review, weight -6 lbs from 03/04/16-03/12/16 and he gained 9 lbs from 03/12/16-06/09/16. Will monitor weight trends closely during hospitalization.   Diet Order:     Skin:  Reviewed, no issues  Last BM:  PTA/unknown  Height:   Ht Readings from Last 1 Encounters:  06/10/16 6\' 2"  (1.88 m)    Weight:   Wt Readings from Last 1 Encounters:  06/13/16 224 lb 10.4 oz (101.9 kg)    Ideal Body Weight:  86.36 kg  BMI:  Body mass index is 28.84 kg/m.  Estimated Nutritional Needs:   Kcal:  2309 (90% [employer] Equation)  Protein:  121-151 grams (1.2-1.5 grams/kg)  Fluid:  2 L/day  EDUCATION NEEDS:   No education needs identified at this time  Jeff FrancoLindsey Blaize Nipper, MS, RD, LDN Pager: [phone] After Hours Pager: [phone]

## 2016-06-13 NOTE — Progress Notes (Signed)
RN notified Dr. Darrick Pennaeterding of pt BP 89/30, MAP of 48. MD made aware of pt elevated temperature of 102.25F and that pt temperature has not responded to Tyelonol or Motrin administration, or application of ice packs. RN requested increase in fluids, or bolus of fluid. No new orders received at this time. Will continue to monitor.

## 2016-06-13 NOTE — Progress Notes (Signed)
Pharmacy Antibiotic Note  Jeff Foster is a 55 y.o. male admitted on 06/19/2016 with pneumonia, fever.  Pharmacy has been consulted for Vancomycin dosing.  Plan: Vancomycin 1gm IV every 8 hours.  Goal trough 15-20 mcg/mL.  Height: 6\' 2"  (188 cm) Weight: 224 lb 10.4 oz (101.9 kg) IBW/kg (Calculated) : 82.2  Temp (24hrs), Avg:100.9 F (38.3 C), Min:98.4 F (36.9 C), Max:103 F (39.4 C)   Recent Labs Lab 2015/09/20 1150 06/08/16 0454 06/09/16 0634 06/10/16 2259 06/11/16 0327 06/12/16 0348  WBC  --  6.9 8.5 16.2* 20.6* 12.4*  CREATININE  --  0.68 0.80 0.83 0.95 1.01  LATICACIDVEN 1.43  --   --  1.0  --   --     Estimated Creatinine Clearance: 104.1 mL/min (by C-G formula based on SCr of 1.01 mg/dL).    Allergies  Allergen Reactions  . Other Other (See Comments)    Pt is allergic to chlorine and bleach.   Reaction:  Unknown   . Peanut-Containing Drug Products Diarrhea    Antimicrobials this admission: Zosyn 12/6 >>  Diflucan (per MD for thrush) 12/6 >>  Vancomycin 12/10 >>  Dose adjustments this admission: -  Microbiology results: 12/5BCx: NGTD 12/7 MRSA PCR: neg 12/8 Trach asp: ngtd  Thank you for allowing pharmacy to be a part of this patient's care.  Aleene DavidsonGrimsley Jr, Dymphna Wadley Crowford 06/13/2016 5:28 AM

## 2016-06-13 NOTE — Progress Notes (Signed)
See progress note.

## 2016-06-13 NOTE — Progress Notes (Signed)
Prior to medication administration pt OG tube auscultated and heard in the stomach. Pt HOB at 40 degrees. Following administration of medications, pt coughed up watery liquid. RN suctioned pt mouth and ETT suctioned pt. Pt lung sounds unchanged, with RUL sounding rhonchus. Pt oxygen saturations maintaining at 98-100% on current ventilator settings. HR maintained at 55-65 bpm. E-Link made aware. Tube feeds paused. Will continue to monitor.

## 2016-06-14 ENCOUNTER — Inpatient Hospital Stay (HOSPITAL_COMMUNITY): Payer: Medicare Other

## 2016-06-14 DIAGNOSIS — N179 Acute kidney failure, unspecified: Secondary | ICD-10-CM

## 2016-06-14 DIAGNOSIS — J9601 Acute respiratory failure with hypoxia: Secondary | ICD-10-CM

## 2016-06-14 LAB — BASIC METABOLIC PANEL
Anion gap: 8 (ref 5–15)
BUN: 23 mg/dL — AB (ref 6–20)
CALCIUM: 8.6 mg/dL — AB (ref 8.9–10.3)
CO2: 27 mmol/L (ref 22–32)
CREATININE: 0.74 mg/dL (ref 0.61–1.24)
Chloride: 108 mmol/L (ref 101–111)
GFR calc Af Amer: >60 mL/min (ref >60)
GLUCOSE: 96 mg/dL (ref 65–99)
Potassium: 3.7 mmol/L (ref 3.5–5.1)
Sodium: 143 mmol/L (ref 135–145)

## 2016-06-14 LAB — GLUCOSE, CAPILLARY
GLUCOSE-CAPILLARY: 96 mg/dL (ref 65–99)
GLUCOSE-CAPILLARY: 95 mg/dL (ref 65–99)
Glucose-Capillary: 82 mg/dL (ref 65–99)
Glucose-Capillary: 105 mg/dL — ABNORMAL HIGH (ref 65–99)

## 2016-06-14 LAB — CBC WITH DIFFERENTIAL/PLATELET
BASOS PCT: 0 %
Basophils Absolute: 0 10*3/uL (ref 0.0–0.1)
EOS ABS: 0.1 10*3/uL (ref 0.0–0.7)
EOS PCT: 1 %
HEMATOCRIT: 26.6 % — AB (ref 39.0–52.0)
Hemoglobin: 8.8 g/dL — ABNORMAL LOW (ref 13.0–17.0)
Lymphocytes Relative: 11 %
Lymphs Abs: 1 10*3/uL (ref 0.7–4.0)
MCH: 27.4 pg (ref 26.0–34.0)
MCHC: 33.1 g/dL (ref 30.0–36.0)
MCV: 82.9 fL (ref 78.0–100.0)
MONO ABS: 1.9 10*3/uL — AB (ref 0.1–1.0)
MONOS PCT: 20 %
Neutro Abs: 6.5 10*3/uL (ref 1.7–7.7)
Neutrophils Relative %: 68 %
PLATELETS: 80 10*3/uL — AB (ref 150–400)
RBC: 3.21 MIL/uL — ABNORMAL LOW (ref 4.22–5.81)
RDW: 16.1 % — AB (ref 11.5–15.5)
WBC: 9.5 10*3/uL (ref 4.0–10.5)

## 2016-06-14 LAB — CULTURE, RESPIRATORY W GRAM STAIN: Culture: NO GROWTH

## 2016-06-14 LAB — CULTURE, RESPIRATORY

## 2016-06-14 LAB — PHOSPHORUS: PHOSPHORUS: 3.5 mg/dL (ref 2.5–4.6)

## 2016-06-14 LAB — MAGNESIUM: MAGNESIUM: 2.2 mg/dL (ref 1.7–2.4)

## 2016-06-14 MED ORDER — FUROSEMIDE 10 MG/ML IJ SOLN
40.0000 mg | Freq: Four times a day (QID) | INTRAMUSCULAR | Status: AC
  Administered 2016-06-14 (×2): 40 mg via INTRAVENOUS
  Filled 2016-06-14 (×2): qty 4

## 2016-06-14 MED ORDER — ALBUMIN HUMAN 25 % IV SOLN
25.0000 g | Freq: Once | INTRAVENOUS | Status: AC
  Administered 2016-06-14: 12.5 g via INTRAVENOUS
  Filled 2016-06-14: qty 50

## 2016-06-14 MED ORDER — ALBUMIN HUMAN 25 % IV SOLN
INTRAVENOUS | Status: AC
  Administered 2016-06-14: 12.5 g
  Filled 2016-06-14: qty 50

## 2016-06-14 NOTE — Progress Notes (Signed)
Date:  June 14, 2016 Chart reviewed for concurrent status and case management needs. Will continue to follow patient progress. Discharge Planning: following for needs Expected discharge date: [id number] Marcelle SmilingRhonda Lakechia Nay, BSN, [place]3, [place]   [phone]

## 2016-06-14 NOTE — Progress Notes (Signed)
Daily Progress Note   Patient Name: Jeff Foster       Date: 06/14/2016 DOB: Nov 12, 1960  Age: 55 y.o. MRN#: [id number] Attending Physician: Jeff Males, MD Primary Care Physician: Jeff Drafts, FNP Admit Date: 06/28/2016  Reason for Consultation/Follow-up: Establishing goals of care  Subjective: Sedated, on vent.  No distress.  Called and updated his legal guardian, Jeff Foster.  See goals below.  Length of Stay: 6  Current Medications: Scheduled Meds:  . sodium chloride   Intravenous Once  . aspirin  81 mg Oral Daily  . chlorhexidine gluconate (MEDLINE KIT)  15 mL Mouth Rinse BID  . famotidine (PEPCID) IV  20 mg Intravenous Q12H  . feeding supplement (PRO-STAT SUGAR FREE 64)  60 mL Per Tube BID  . furosemide  40 mg Intravenous Q6H  . glycopyrrolate  0.2 mg Intravenous QID  . ipratropium-albuterol  3 mL Nebulization Q4H  . levothyroxine  25 mcg Oral QAC breakfast  . mouth rinse  15 mL Mouth Rinse QID  . mirtazapine  15 mg Oral QHS  . oxcarbazepine  600 mg Oral BID  . piperacillin-tazobactam (ZOSYN)  IV  3.375 g Intravenous Q8H  . QUEtiapine  200 mg Oral QHS  . sertraline  100 mg Oral Daily  . trazodone  300 mg Oral QHS  . Valproate Sodium  500 mg Per Tube BID    Continuous Infusions: . sodium chloride 75 mL/hr at 06/14/16 1200  . dexmedetomidine 0.298 mcg/kg/hr (06/14/16 1200)  . feeding supplement (OSMOLITE 1.5 CAL) Stopped (06/13/16 2223)  . fentaNYL infusion INTRAVENOUS 40 mcg/hr (06/14/16 1200)    PRN Meds: acetaminophen, bisacodyl, fentaNYL, fentaNYL (SUBLIMAZE) injection, midazolam, midazolam, ondansetron **OR** ondansetron (ZOFRAN) IV  Physical Exam       General: Intubated/sedated.  HEENT: No bruits, no goiter, Heart: Regular rate and rhythm. No  murmur appreciated. Lungs: On vent, few crackles Abdomen: Soft, nontender, nondistended, positive bowel sounds.  Skin: Warm and dry  Vital Signs: BP (!) 115/53   Pulse (!) 57   Temp 99.4 F (37.4 C) (Oral)   Resp 16   Ht _0  (1.88 m)   Wt 105.1 kg (231 lb 11.3 oz)   SpO2 91%   BMI 29.75 kg/m  SpO2: SpO2: 91 % O2 Device: O2 Device: Ventilator O2 Flow Rate: O2 Flow Rate (  L/min): 40 L/min  Intake/output summary:  Intake/Output Summary (Last 24 hours) at 06/14/16 1233 Last data filed at 06/14/16 1200  Gross per 24 hour  Intake          3715.49 ml  Output              920 ml  Net          2795.49 ml   LBM: Last BM Date:  (UTA) Baseline Weight: Weight: 91.6 kg (202 lb) Most recent weight: Weight: 105.1 kg (231 lb 11.3 oz)       Palliative Assessment/Data:    Flowsheet Rows   Flowsheet Row Most Recent Value  Intake Tab  Referral Department  Hospitalist  Unit at Time of Referral  ICU  Palliative Care Primary Diagnosis  Neurology  Date Notified  06/09/16  Palliative Care Type  Return patient Palliative Care  Reason for referral  Clarify Goals of Care  Date of Admission  06/26/2016  # of days IP prior to Palliative referral  2  Clinical Assessment  Psychosocial & Spiritual Assessment  Palliative Care Outcomes      Patient Active Problem List   Diagnosis Date Noted  . Acute kidney injury ([place]) 06/12/2016  . HCAP (healthcare-associated pneumonia)   . Goals of care, counseling/discussion   . Encounter for intubation   . Hyponatremia 06/08/2016  . Delirium 06/25/2016  . Anxiety, generalized 03/13/2016  . Intermittent explosive disorder 03/13/2016  . Urinary retention 03/10/2016  . Aggressive behavior 03/06/2016  . Essential hypertension   . Post-ictal confusion 10/27/2015  . Hypothyroidism 10/27/2015  . Thrombocytopenia ([place]) 10/27/2015  . Acute respiratory failure with hypoxia ([place]) 10/27/2015  . Seizure ([place]) 10/27/2015  . Psychosis 10/27/2015  .  Normocytic anemia 10/27/2015  . DNR (do not resuscitate) 07/25/2015  . Aspiration pneumonia ([place]) 07/25/2015  . Palliative care encounter 07/25/2015  . Dysphagia   . Altered mental status   . UTI (lower urinary tract infection) 07/18/2015  . Cognitive decline 07/18/2015  . Acute encephalopathy 07/18/2015  . Mental retardation 07/18/2015  . Hypertension 07/18/2015  . History of CVA (cerebrovascular accident) 07/18/2015  . Encephalopathy acute 07/18/2015  . Dehydration     Palliative Care Assessment & Plan   Patient Profile:  55 y.o. male  with past medical history of MR with intermittent explosive disorder, paranoid schizophrenia, seizures, dysphagia with chronic aspiration, and CVA who was admitted on 06/15/2016 after his group home noted he had new tremors, a fall, and altered mental status. Initial work-up was largely non-diagnostic with CXR and urine without obvious infectious source, MRI and CT brain without obvious encephalopathic etiology. He did become increasingly aggressive, which shifted into somnolence and becoming unresponsive after he developed what appears to be aspiration pneumonia. Despite abx, he continues to deteriorate and required intubation on 12/7.   Recommendations/Plan:  Goals of care:  Called and discussed with his guardian, Jeff Foster.  We reviewed his clinical course over weekend as well as failed breathing trial.  Discussed increased intermittent agitation, fever, and addition of Vanc.  We began discussion about next steps, including plan to work on diuresis to see if he can be extubated.  We also had initial discussion regarding plan not to reintubate once he is extubated (after working to diurese over the next couple of days).  She was open to discussion and very appreciative of care Jeff Foster has gotten from all of our staff.  She understands he remains critically ill and would appreciate update on his  condition in 24-48 hours, or sooner if any significant changes. She  would like to be notified of plan for extubation prior to extubation if he is not expected to survive.  If he acutely decompensates, she would like to be notified.  Plan remains for no escalation of care, but she would like to arrange to come and sit with him if he appears to be imminently dying.   Goals of Care and Additional Recommendations:  Limitations on Scope of Treatment: Continue current therapies, no escalation of care in event of decompensation  Code Status:    Code Status Orders        Start     Ordered   06/11/16 1702  Do not attempt resuscitation (DNR)  Continuous    Question Answer Comment  In the event of cardiac or respiratory ARREST Do not call a "code blue"   In the event of cardiac or respiratory ARREST Do not perform Intubation, CPR, defibrillation or ACLS   In the event of cardiac or respiratory ARREST Use medication by any route, position, wound care, and other measures to relive pain and suffering. May use oxygen, suction and manual treatment of airway obstruction as needed for comfort.      06/11/16 1701    Code Status History    Date Active Date Inactive Code Status Order ID Comments User Context   06/09/2016 11:27 AM 06/11/2016  5:01 PM Full Code [id number]  Albertine Patricia, MD Inpatient   06/24/2016  8:36 PM 06/09/2016 11:27 AM DNR [id number]  Norval Morton, MD ED   03/12/2016 10:34 PM 03/17/2016  6:26 PM Full Code [id number]  Orlie Dakin, MD ED   03/10/2016  5:23 PM 03/12/2016  9:49 PM Full Code [id number]  Caren Griffins, MD ED   03/10/2016  5:07 PM 03/10/2016  5:23 PM DNR [id number]  Mercy Riding, MD ED   03/04/2016  6:37 PM 03/06/2016  5:16 PM Full Code [id number]  Monico Blitz, PA-C ED   03/04/2016  4:18 PM 03/04/2016  6:37 PM Full Code [id number]  Monico Blitz, PA-C ED   10/27/2015 10:03 PM 10/28/2015  9:06 PM DNR [id number]  Vianne Bulls, MD ED   07/25/2015 11:18 AM 08/07/2015  8:00 PM DNR [id number]  Melton Alar, PA-C Inpatient   07/18/2015  8:49 PM 07/25/2015  11:18 AM Full Code [id number]  Willia Craze, NP Inpatient       Prognosis:   Unable to determine, but his outlook is poor  Discharge Planning:  To Be Determined  Care plan was discussed with Stanford Breed (legal guardian), Dr. Lake Bells  Thank you for allowing the Palliative Medicine Team to assist in the care of this patient.   Time In: 1130 Time Out: 1200 Total Time 30 Prolonged Time Billed No      Greater than 50%  of this time was spent counseling and coordinating care related to the above assessment and plan.  Micheline Rough, MD  Please contact Palliative Medicine Team phone at [phone] for questions and concerns.

## 2016-06-14 NOTE — Progress Notes (Signed)
PULMONARY / CRITICAL CARE MEDICINE   Name: Jeff Foster MRN: [id number] DOB: June 24, 1961    ADMISSION DATE:  06/26/2016 CONSULTATION DATE:  06/10/2016  REFERRING MD:  Dr. Randol KernElgergawy  CHIEF COMPLAINT:  AMS  BRIEF: 55 y/o male with significant mental retardation who was admitted on 12/4 with confusion; later developed aspiration pneumonia and respiratory failure requiring intubation.    EVENTS 12/4 - admit 12/7 - Intubated for airway protection MRI brain 12/4 > no evidence acute infarct or hemorrhage, moderate brain parenchymal volume loss (stable). 06/10/16 : Sedated or agitated 06/12/16 - wua off sedatopm - moved all 4s and got agitated. Did not follow commands. Needing 70% fio2, peep 5. Oliguric - bladder scan ->  Now full DNR without vasopressors or escalation but continue current care without cvl placement 12/ 10 - agitated signifciantly  On fent gtt  And versed prn. Febrile 103F and vanc added   SUBJECTIVE/OVERNIGHT/INTERVAL HX Fever improved overnight with ibuprofen Some agitation Failed SBT this morning  VITAL SIGNS: BP 105/65   Pulse (!) 57   Temp 98.4 F (36.9 C) (Axillary)   Resp 18   Ht 6\' 2"  (1.88 m)   Wt 231 lb 11.3 oz (105.1 kg)   SpO2 94%   BMI 29.75 kg/m   HEMODYNAMICS:    VENTILATOR SETTINGS: Vent Mode: PRVC FiO2 (%):  [30 %-40 %] 30 % Set Rate:  [16 bmp] 16 bmp Vt Set:  [409[660 mL] 660 mL PEEP:  [5 cmH20] 5 cmH20 Pressure Support:  [5 cmH20] 5 cmH20 Plateau Pressure:  [21 cmH20-30 cmH20] 21 cmH20  INTAKE / OUTPUT: I/O last 3 completed shifts: In: 6535 [I.V.:3442.5; NG/GT:1592.5; IV Piggyback:1500] Out: 1600 [Urine:1450; Emesis/NG output:150]  PHYSICAL EXAMINATION: General:  Intubated HENT: NCAT ETT in place PULM: Vent supported breaths, few crackles bilaterally  CV: RRR, no mgr GI: BS+, soft, nontender MSK: normal bulk and tone Neuro: Sedated on vent, makes spontaneous movements  LABS:  BMET  Recent Labs Lab 06/11/16 0327 06/12/16 0348  06/14/16 0352  NA 136 136 143  K 4.4 4.1 3.7  CL 98* 101 108  CO2 29 27 27   BUN 15 23* 23*  CREATININE 0.95 1.01 0.74  GLUCOSE 112* 101* 96    Electrolytes  Recent Labs Lab 06/11/16 0327  06/12/16 0348 06/12/16 1649 06/14/16 0352  CALCIUM 8.8*  --  8.5*  --  8.6*  MG  --   < > 1.8 2.3 2.2  PHOS  --   < > 3.6 2.9 3.5  < > = values in this interval not displayed.  CBC  Recent Labs Lab 06/11/16 0327 06/12/16 0348 06/14/16 0352  WBC 20.6* 12.4* 9.5  HGB 8.5* 9.2* 8.8*  HCT 25.6* 28.1* 26.6*  PLT 72* 56* 80*    Coag's No results for input(s): APTT, INR in the last 168 hours.  Sepsis Markers  Recent Labs Lab 10/23/2015 1150 06/10/16 2259 06/11/16 0327 06/12/16 0348 06/13/16 0328  LATICACIDVEN 1.43 1.0  --   --   --   PROCALCITON  --   --  0.35 0.36 0.29    ABG  Recent Labs Lab 06/10/16 2300 06/11/16 0100  PHART 7.339* 7.403  PCO2ART 59.6* 49.6*  PO2ART 70.6* 143*    Liver Enzymes  Recent Labs Lab 06/10/16 2259  AST 80*  ALT 18  ALKPHOS 69  BILITOT 0.9  ALBUMIN 3.3*    Cardiac Enzymes No results for input(s): TROPONINI, PROBNP in the last 168 hours.  Glucose  Recent Labs Lab 06/13/16  1159 06/13/16 1547 06/13/16 1927 06/13/16 2323 06/14/16 0339 06/14/16 0823  GLUCAP 161* 172* 145* 230* 82 96    Imaging Dg Chest Port 1 View  Result Date: 06/14/2016 CLINICAL DATA:  Intubation. EXAM: PORTABLE CHEST 1 VIEW COMPARISON:  06/12/2016 . FINDINGS: Interim removal of NG tube. Endotracheal tube in anatomic position. Fullness of the upper mediastinum noted. Heart size stable. Bilateral pulmonary infiltrates are noted, progressive from prior exam. Small left pleural effusion. No pneumothorax. IMPRESSION: 1. Interim removal of NG tube. Endotracheal tube in stable position . 2. Progressive bilateral multifocal pulmonary infiltrates. Continued follow-up chest x-ray suggested to demonstrate clearing. 3. Upper mediastinal fullness. Although this may  be vascular to further evaluate a follow-up PA and lateral chest x-ray is suggested when the patient is capable. Electronically Signed   By: Maisie Fushomas  Register   On: 06/14/2016 07:04   12/11 CXR images reviewed> persistent bilateral infiltrates, ETT in place  ASSESSMENT / PLAN:  PULMONARY A: Acute hypoxemic respiratory failure secondary to aspiration PNA Likely pulmonary edema  P:   Continue full vent support Ventilator pneumonia prevention protocol Pulmonary toilet See ID Diurese today  CARDIOVASCULAR A:  Borderline hypotension, not in shock  P:  Telemetry monitoring HD monitoring Diuresis today  RENAL  A:  AKI - > resolved  P:   Hold fluids Lasix give volume overload  GASTROINTESTINAL A:   Dysphagia - chronic  P:   OG tube Tube feedings Famotidine for stress ulcer prophylaxis  HEMATOLOGIC  Recent Labs Lab 06/11/16 0327 06/12/16 0348 06/14/16 0352  HGB 8.5* 9.2* 8.8*  HCT 25.6* 28.1* 26.6*  WBC 20.6* 12.4* 9.5  PLT 72* 56* 80*    A:   Anemia - of chronic dz and critical illness Thrombocytopenia> stable  P:  Follow CBC SCDs F/U HITT AB  INFECTIOUS   A:   Aspiration PNA + ongoing fevers  P:   Stop Vanc Stop diflucan Continue zosyn F/u cultures   ENDOCRINE A:   No acute issues  P:   Follow glucose  NEUROLOGIC A:   Intellectual disability Paranoid shizophrenia Sedation needs for vent protocol  P:   RASS goal: -1 Continue Precedex infusion, fentanyl gtt per PAD protocol Consider restarting (thorazine, gabapentin, haldol, hydroxizine) Continue depakote and synthroid via tube   FAMILY : - Updates: DSS sent paperwork 06/11/16 - full DNAR without escalation but continue current care (no cvl or vasopressors) towads medical extubation and improvement. If fails, then terminal wean  - Inter-disciplinary family meet or Palliative Care meeting due by:  12/14   My cc time 35 minutes  Heber CarolinaBrent Shamyra Farias, MD [place] [facility] Pager:  [phone] Cell: [phone] After 3pm or if no response, call [phone]   06/14/2016 8:54 AM

## 2016-06-14 NOTE — Progress Notes (Addendum)
Upon turning pt, RN noted that portion of OG tube was coiled in pt's mouth. Upon adjusting OG tube pt became increasingly agitated and combative. RN removed OG tube, per pt coughing, agitation, and risk for incorrect placement. OG tube was clamped at time of removal. Will continue to monitor.

## 2016-06-14 NOTE — Progress Notes (Signed)
NUTRITION NOTE  Pt seen for follow-up by RD 12/10. He remains intubated. Per rounds this AM, [facility] yesterday [facility] concern for not tolerating TF/not digesting TF. Plan at this time is to restart TF today. RD will follow-up tomorrow concerning the same.     Trenton GammonJessica Ernie Kasler, MS, RD, LDN, [facility] Inpatient Clinical Dietitian Pager # [phone] After hours/weekend pager # [phone]

## 2016-06-15 ENCOUNTER — Inpatient Hospital Stay (HOSPITAL_COMMUNITY): Payer: Medicare Other

## 2016-06-15 LAB — GLUCOSE, CAPILLARY
GLUCOSE-CAPILLARY: 95 mg/dL (ref 65–99)
GLUCOSE-CAPILLARY: 93 mg/dL (ref 65–99)
GLUCOSE-CAPILLARY: 103 mg/dL — AB (ref 65–99)
GLUCOSE-CAPILLARY: 103 mg/dL — AB (ref 65–99)
GLUCOSE-CAPILLARY: 102 mg/dL — AB (ref 65–99)
GLUCOSE-CAPILLARY: 106 mg/dL — AB (ref 65–99)
Glucose-Capillary: 97 mg/dL (ref 65–99)
Glucose-Capillary: 89 mg/dL (ref 65–99)

## 2016-06-15 LAB — CBC WITH DIFFERENTIAL/PLATELET
BASOS ABS: 0.1 10*3/uL (ref 0.0–0.1)
Basophils Relative: 1 %
Eosinophils Absolute: 0.2 10*3/uL (ref 0.0–0.7)
Eosinophils Relative: 3 %
HEMATOCRIT: 23.9 % — AB (ref 39.0–52.0)
HEMOGLOBIN: 8.3 g/dL — AB (ref 13.0–17.0)
LYMPHS ABS: 1.3 10*3/uL (ref 0.7–4.0)
Lymphocytes Relative: 19 %
MCH: 29.2 pg (ref 26.0–34.0)
MCHC: 34.7 g/dL (ref 30.0–36.0)
MCV: 84.2 fL (ref 78.0–100.0)
MONO ABS: 1.6 10*3/uL — AB (ref 0.1–1.0)
MONOS PCT: 23 %
NEUTROS ABS: 3.9 10*3/uL (ref 1.7–7.7)
Neutrophils Relative %: 54 %
Platelets: 99 10*3/uL — ABNORMAL LOW (ref 150–400)
RBC: 2.84 MIL/uL — ABNORMAL LOW (ref 4.22–5.81)
RDW: 16 % — AB (ref 11.5–15.5)
WBC: 7.1 10*3/uL (ref 4.0–10.5)

## 2016-06-15 LAB — BASIC METABOLIC PANEL
ANION GAP: 6 (ref 5–15)
BUN: 22 mg/dL — AB (ref 6–20)
CO2: 29 mmol/L (ref 22–32)
Calcium: 8.4 mg/dL — ABNORMAL LOW (ref 8.9–10.3)
Chloride: 108 mmol/L (ref 101–111)
Creatinine, Ser: 0.86 mg/dL (ref 0.61–1.24)
GFR calc Af Amer: >60 mL/min (ref >60)
GFR calc non Af Amer: >60 mL/min (ref >60)
Glucose, Bld: 91 mg/dL (ref 65–99)
POTASSIUM: 3.8 mmol/L (ref 3.5–5.1)
Sodium: 143 mmol/L (ref 135–145)

## 2016-06-15 LAB — PHOSPHORUS: PHOSPHORUS: 3.6 mg/dL (ref 2.5–4.6)

## 2016-06-15 LAB — MAGNESIUM: MAGNESIUM: 1.7 mg/dL (ref 1.7–2.4)

## 2016-06-15 MED ORDER — GLYCOPYRROLATE 0.2 MG/ML IJ SOLN
0.2000 mg | Freq: Four times a day (QID) | INTRAMUSCULAR | Status: DC | PRN
  Administered 2016-06-16 – 2016-06-20 (×2): 0.2 mg via INTRAVENOUS
  Filled 2016-06-15 (×5): qty 1

## 2016-06-15 MED ORDER — POTASSIUM CHLORIDE CRYS ER 20 MEQ PO TBCR
40.0000 meq | EXTENDED_RELEASE_TABLET | Freq: Once | ORAL | Status: DC
  Administered 2016-06-15: 40 meq via ORAL
  Filled 2016-06-15: qty 2

## 2016-06-15 MED ORDER — OXCARBAZEPINE 300 MG/5ML PO SUSP
600.0000 mg | Freq: Two times a day (BID) | ORAL | Status: DC
  Filled 2016-06-15: qty 10

## 2016-06-15 MED ORDER — GABAPENTIN 300 MG PO CAPS: 300.0000 mg | ORAL_CAPSULE | Freq: Three times a day (TID) | ORAL | Status: DC

## 2016-06-15 MED ORDER — FUROSEMIDE 10 MG/ML IJ SOLN
40.0000 mg | Freq: Two times a day (BID) | INTRAMUSCULAR | Status: AC
  Administered 2016-06-15 (×2): 40 mg via INTRAVENOUS
  Filled 2016-06-15 (×3): qty 4

## 2016-06-15 MED ORDER — POTASSIUM CHLORIDE 20 MEQ/15ML (10%) PO SOLN: 40.0000 meq | Freq: Once | ORAL | Status: AC

## 2016-06-15 MED ORDER — OXCARBAZEPINE 300 MG PO TABS
600.0000 mg | ORAL_TABLET | Freq: Two times a day (BID) | ORAL | Status: DC
  Administered 2016-06-15 – 2016-06-18 (×7): 600 mg via ORAL
  Filled 2016-06-15 (×11): qty 2

## 2016-06-15 MED ORDER — LORAZEPAM 1 MG PO TABS
1.0000 mg | ORAL_TABLET | Freq: Two times a day (BID) | ORAL | Status: DC
  Administered 2016-06-15 – 2016-06-18 (×8): 1 mg via ORAL
  Filled 2016-06-15 (×8): qty 1

## 2016-06-15 MED ORDER — GABAPENTIN 250 MG/5ML PO SOLN
300.0000 mg | Freq: Three times a day (TID) | ORAL | Status: DC
  Administered 2016-06-15 – 2016-06-18 (×8): 300 mg
  Filled 2016-06-15 (×9): qty 6

## 2016-06-15 MED ORDER — SENNOSIDES 8.8 MG/5ML PO SYRP
5.0000 mL | ORAL_SOLUTION | Freq: Two times a day (BID) | ORAL | Status: DC
  Administered 2016-06-15 – 2016-06-18 (×5): 5 mL via ORAL
  Filled 2016-06-15 (×12): qty 5

## 2016-06-15 MED ORDER — MAGNESIUM SULFATE 2 GM/50ML IV SOLN
2.0000 g | Freq: Once | INTRAVENOUS | Status: AC
  Administered 2016-06-15: 2 g via INTRAVENOUS
  Filled 2016-06-15: qty 50

## 2016-06-15 MED ORDER — OSMOLITE 1.5 CAL PO LIQD
1000.0000 mL | ORAL | Status: DC
  Administered 2016-06-15 – 2016-06-16 (×2): 1000 mL
  Filled 2016-06-15 (×3): qty 1000

## 2016-06-15 MED ORDER — CHLORPROMAZINE HCL 50 MG PO TABS
50.0000 mg | ORAL_TABLET | Freq: Three times a day (TID) | ORAL | Status: DC
  Administered 2016-06-15 – 2016-06-18 (×11): 50 mg via ORAL
  Filled 2016-06-15 (×19): qty 1

## 2016-06-15 NOTE — Progress Notes (Addendum)
Daily Progress Note   Patient Name: Jeff Foster       Date: 06/15/2016 DOB: 1961-02-12  Age: 54 y.o. MRN#: [id number] Attending Physician: Jeff Males, MD Primary Care Physician: Jeff Drafts, FNP Admit Date: 06/09/2016  Reason for Consultation/Follow-up: Establishing goals of care  Subjective: Jeff Foster remains sedated on a vent. He has not been able to tolerate weaning. He has had periods of increased agitation requiring PRN medication.   Length of Stay: 7  Current Medications: Scheduled Meds:  . aspirin  81 mg Oral Daily  . chlorhexidine gluconate (MEDLINE KIT)  15 mL Mouth Rinse BID  . chlorproMAZINE  50 mg Oral TID  . famotidine (PEPCID) IV  20 mg Intravenous Q12H  . feeding supplement (PRO-STAT SUGAR FREE 64)  60 mL Per Tube BID  . furosemide  40 mg Intravenous Q12H  . gabapentin  300 mg Per Tube Q8H  . glycopyrrolate  0.2 mg Intravenous QID  . ipratropium-albuterol  3 mL Nebulization Q4H  . levothyroxine  25 mcg Oral QAC breakfast  . LORazepam  1 mg Oral BID  . magnesium sulfate 1 - 4 g bolus IVPB  2 g Intravenous Once  . mouth rinse  15 mL Mouth Rinse QID  . mirtazapine  15 mg Oral QHS  . OXcarbazepine  600 mg Per Tube BID  . piperacillin-tazobactam (ZOSYN)  IV  3.375 g Intravenous Q8H  . potassium chloride  40 mEq Oral Once  . QUEtiapine  200 mg Oral QHS  . sertraline  100 mg Oral Daily  . trazodone  300 mg Oral QHS  . Valproate Sodium  500 mg Per Tube BID    Continuous Infusions: . sodium chloride 10 mL/hr at 06/15/16 0952  . dexmedetomidine 0.4 mcg/kg/hr (06/15/16 0827)  . feeding supplement (OSMOLITE 1.5 CAL) Stopped (06/13/16 2223)  . fentaNYL infusion INTRAVENOUS 100 mcg/hr (06/15/16 0800)    PRN Meds: acetaminophen, bisacodyl, fentaNYL, fentaNYL  (SUBLIMAZE) injection, midazolam, ondansetron **OR** ondansetron (ZOFRAN) IV  Physical Exam  Constitutional: He has a sickly appearance. He is sedated.  HENT:  Head: Normocephalic and atraumatic.  Cardiovascular: Regular rhythm.  Bradycardia present.   Pulmonary/Chest:  Vent. Trace crackles and diminished in bilateral bases  Abdominal: Soft. Bowel sounds are decreased.  Musculoskeletal: He exhibits edema (trace in BLE).  Spontaneous movement of  extremities. Hands in mitts. Pt intermittently kicking legs  Skin: Skin is warm and dry. There is pallor.         Vital Signs: BP 131/65   Pulse (!) 57   Temp 99.9 F (37.7 C) (Axillary)   Resp 20   Ht '6\' 2"'$  (1.88 m)   Wt 103.7 kg (228 lb 9.9 oz)   SpO2 98%   BMI 29.35 kg/m  SpO2: SpO2: 98 % O2 Device: O2 Device: Ventilator O2 Flow Rate: O2 Flow Rate (L/min): 40 L/min  Intake/output summary:   Intake/Output Summary (Last 24 hours) at 06/15/16 1002 Last data filed at 06/15/16 0700  Gross per 24 hour  Intake          2142.87 ml  Output             5085 ml  Net         -2942.13 ml   LBM: Last BM Date:  (UTA) Baseline Weight: Weight: 91.6 kg (202 lb) Most recent weight: Weight: 103.7 kg (228 lb 9.9 oz)  Flowsheet Rows   Flowsheet Row Most Recent Value  Intake Tab  Referral Department  Hospitalist  Unit at Time of Referral  ICU  Palliative Care Primary Diagnosis  Neurology  Date Notified  06/09/16  Palliative Care Type  Return patient Palliative Care  Reason for referral  Clarify Goals of Care  Date of Admission  06/19/2016  # of days IP prior to Palliative referral  2  Clinical Assessment  Psychosocial & Spiritual Assessment  Palliative Care Outcomes      Patient Active Problem List   Diagnosis Date Noted  . Acute kidney injury ([place]) 06/12/2016  . HCAP (healthcare-associated pneumonia)   . Goals of care, counseling/discussion   . Encounter for intubation   . Hyponatremia 06/08/2016  . Delirium 06/30/2016  . Anxiety,  generalized 03/13/2016  . Intermittent explosive disorder 03/13/2016  . Urinary retention 03/10/2016  . Aggressive behavior 03/06/2016  . Essential hypertension   . Post-ictal confusion 10/27/2015  . Hypothyroidism 10/27/2015  . Thrombocytopenia ([place]) 10/27/2015  . Acute respiratory failure with hypoxemia ([place]) 10/27/2015  . Seizure ([place]) 10/27/2015  . Psychosis 10/27/2015  . Normocytic anemia 10/27/2015  . DNR (do not resuscitate) 07/25/2015  . Aspiration pneumonia ([place]) 07/25/2015  . Palliative care encounter 07/25/2015  . Dysphagia   . Altered mental status   . UTI (lower urinary tract infection) 07/18/2015  . Cognitive decline 07/18/2015  . Acute encephalopathy 07/18/2015  . Mental retardation 07/18/2015  . Hypertension 07/18/2015  . History of CVA (cerebrovascular accident) 07/18/2015  . Encephalopathy acute 07/18/2015  . Dehydration     Palliative Care Assessment & Plan   Patient Profile:  55 y.o. male  with past medical history of MR with intermittent explosive disorder, paranoid schizophrenia, seizures, dysphagia with chronic aspiration, and CVA who was admitted on 07/03/2016 after his group home noted he had new tremors, a fall, and altered mental status. Initial work-up was largely non-diagnostic with CXR and urine without obvious infectious source, MRI and CT brain without obvious encephalopathic etiology. He did become increasingly aggressive, which shifted into somnolence and becoming unresponsive after he developed what appears to be aspiration pneumonia. Despite abx, he continues to deteriorate and required intubation on 12/7.   Recommendations/Plan: Goals of care:  Dr. Domingo Foster had a long discussion with pt's legal guardian, Jeff Foster, on 12/11. During this discussion the plan established was to continue current level of care with no escalation (no pressors, invasive procedures,  central line, DNR). Primary team is going to continue to diurese and continue to attempt vent  weaning. If he does not improve despite diuresis and is unable to be vent weaned, the plan is to one-way extubate with full comfort care   Call Jeff Foster with any acute changes, she would want to be at his bedside if he is imminently dying  I will call Jeff Foster tomorrow to discuss timing of one-way extubation if pt has not improved, plan would be to extubate either Wednesday or Thursday  Symptoms Constipation (unclear on last BM, reduced bowel sounds): Start senna liquid BID, Dulcolax PR PRN, consider abd imaging to rule out obstruction Secretions (improved overall): Change robinul to PRN Sedation: On Fentanyl and Precedex drip, PRN Fentanyl and Versed  Goals of Care and Additional Recommendations:  Limitations on Scope of Treatment: Continue current therapies, no escalation of care in event of decompensation  Code Status:    Code Status Orders        Start     Ordered   06/11/16 1702  Do not attempt resuscitation (DNR)  Continuous    Question Answer Comment  In the event of cardiac or respiratory ARREST Do not call a "code blue"   In the event of cardiac or respiratory ARREST Do not perform Intubation, CPR, defibrillation or ACLS   In the event of cardiac or respiratory ARREST Use medication by any route, position, wound care, and other measures to relive pain and suffering. May use oxygen, suction and manual treatment of airway obstruction as needed for comfort.      06/11/16 1701    Code Status History    Date Active Date Inactive Code Status Order ID Comments User Context   06/09/2016 11:27 AM 06/11/2016  5:01 PM Full Code [id number]  Albertine Patricia, MD Inpatient   06/12/2016  8:36 PM 06/09/2016 11:27 AM DNR [id number]  Norval Morton, MD ED   03/12/2016 10:34 PM 03/17/2016  6:26 PM Full Code [id number]  Orlie Dakin, MD ED   03/10/2016  5:23 PM 03/12/2016  9:49 PM Full Code [id number]  Caren Griffins, MD ED   03/10/2016  5:07 PM 03/10/2016  5:23 PM DNR [id number]  Mercy Riding, MD ED    03/04/2016  6:37 PM 03/06/2016  5:16 PM Full Code [id number]  Monico Blitz, PA-C ED   03/04/2016  4:18 PM 03/04/2016  6:37 PM Full Code [id number]  Monico Blitz, PA-C ED   10/27/2015 10:03 PM 10/28/2015  9:06 PM DNR [id number]  Vianne Bulls, MD ED   07/25/2015 11:18 AM 08/07/2015  8:00 PM DNR [id number]  Melton Alar, PA-C Inpatient   07/18/2015  8:49 PM 07/25/2015 11:18 AM Full Code [id number]  Willia Craze, NP Inpatient       Prognosis:   Unable to determine, but his outlook is poor  Discharge Planning:  To Be Determined  Care plan was discussed with Stanford Breed (legal guardian), primary team Np, care nurse, dietician  Thank you for allowing the Palliative Medicine Team to assist in the care of this patient.   Time In: 1000 Time Out: 1030 Total Time 30 Prolonged Time Billed No      Greater than 50%  of this time was spent counseling and coordinating care related to the above assessment and plan.  Jannette Fogo, NP  Please contact Palliative Medicine Team phone at [phone] for questions and concerns.

## 2016-06-15 NOTE — Progress Notes (Signed)
PULMONARY / CRITICAL CARE MEDICINE   Name: Jeff Foster MRN: [id number] DOB: 02-22-1961    ADMISSION DATE:  07/04/2016 CONSULTATION DATE:  06/10/2016  REFERRING MD:  Dr. Randol KernElgergawy  CHIEF COMPLAINT:  AMS  BRIEF SUMMARY: 55 y/o male with PMH of MR, ward of the state, admitted on 12/4 with confusion.  Later developed aspiration PNA and respiratory failure requiring intubation.     SUBJECTIVE:  No acute events overnight. Tmax 100.  VSS.  O2 weaned to 40%/PEEP 5.  Neg 2.5 L with lasix    VITAL SIGNS: BP 135/73   Pulse (!) 57   Temp 99.9 F (37.7 C) (Axillary)   Resp 17   Ht 6\' 2"  (1.88 m)   Wt 228 lb 9.9 oz (103.7 kg)   SpO2 98%   BMI 29.35 kg/m   HEMODYNAMICS:    VENTILATOR SETTINGS: Vent Mode: PRVC FiO2 (%):  [30 %-40 %] 40 % Set Rate:  [16 bmp] 16 bmp Vt Set:  [660 mL] 660 mL PEEP:  [5 cmH20] 5 cmH20 Plateau Pressure:  [20 cmH20-23 cmH20] 20 cmH20  INTAKE / OUTPUT: I/O last 3 completed shifts: In: 4289.2 [I.V.:3246.7; NG/GT:192.5; IV Piggyback:850] Out: 5685 [Urine:5285; Emesis/NG output:400]  PHYSICAL EXAMINATION: General: adult male in NAD on vent  HENT: NCAT, ETT in place PULM: Vent supported breaths, scattered crackles bilaterally  CV: RRR, no mgr GI: BS+, soft, nontender MSK: normal bulk and tone Neuro: Sedated on vent, spontaneous movements noted  LABS:  BMET  Recent Labs Lab 06/12/16 0348 06/14/16 0352 06/15/16 0358  NA 136 143 143  K 4.1 3.7 3.8  CL 101 108 108  CO2 27 27 29   BUN 23* 23* 22*  CREATININE 1.01 0.74 0.86  GLUCOSE 101* 96 91    Electrolytes  Recent Labs Lab 06/12/16 0348 06/12/16 1649 06/14/16 0352 06/15/16 0358  CALCIUM 8.5*  --  8.6* 8.4*  MG 1.8 2.3 2.2 1.7  PHOS 3.6 2.9 3.5 3.6    CBC  Recent Labs Lab 06/12/16 0348 06/14/16 0352 06/15/16 0358  WBC 12.4* 9.5 7.1  HGB 9.2* 8.8* 8.3*  HCT 28.1* 26.6* 23.9*  PLT 56* 80* 99*    Coag's No results for input(s): APTT, INR in the last 168 hours.  Sepsis  Markers  Recent Labs Lab 06/10/16 2259 06/11/16 0327 06/12/16 0348 06/13/16 0328  LATICACIDVEN 1.0  --   --   --   PROCALCITON  --  0.35 0.36 0.29    ABG  Recent Labs Lab 06/10/16 2300 06/11/16 0100  PHART 7.339* 7.403  PCO2ART 59.6* 49.6*  PO2ART 70.6* 143*    Liver Enzymes  Recent Labs Lab 06/10/16 2259  AST 80*  ALT 18  ALKPHOS 69  BILITOT 0.9  ALBUMIN 3.3*    Cardiac Enzymes No results for input(s): TROPONINI, PROBNP in the last 168 hours.  Glucose  Recent Labs Lab 06/14/16 1134 06/14/16 1630 06/14/16 1952 06/14/16 2326 06/15/16 0328 06/15/16 0722  GLUCAP 105* 95 97 95 93 89    Imaging Dg Abd 1 View  Result Date: 06/14/2016 CLINICAL DATA:  Status post NG tube placement EXAM: ABDOMEN - 1 VIEW COMPARISON:  06/11/2016 FINDINGS: Interval placement of nasogastric tube. The tip is in the projection of body of the stomach and the side port appears below the GE junction. Patchy pulmonary opacities are identified bilaterally. IMPRESSION: 1. NG tube tip is in the projection of the body of the stomach. Electronically Signed   By: Signa Kellaylor  [place] M.D.   On:  06/14/2016 10:59   Dg Chest Port 1 View  Result Date: 06/15/2016 CLINICAL DATA:  Acute respiratory failure with hypoxemia. EXAM: PORTABLE CHEST 1 VIEW COMPARISON:  06/14/2016 FINDINGS: Endotracheal tube in good position.  NG tube in the stomach. Bibasilar airspace disease left greater than right is unchanged. Possible pneumonia. No significant effusion. Negative for edema. IMPRESSION: Endotracheal tube in good position Bibasilar airspace disease unchanged. Electronically Signed   By: Marlan Palauharles  [place] M.D.   On: 06/15/2016 08:12   EVENTS 12/04 - admit 12/07 - Intubated for airway protection 12/07 - Sedated or agitated 12/09 - Remains on 70%, oliguric.  Full DNR w/o vasopressors or escalation established.  Continue current Rx 12/10 - agitated, on fent gtt + versed prn. Febrile 103F, vanc added 12/11 - ongoing  fever, failed SBT.  Lasix > net neg 2.5L  STUDIES:  MRI brain 12/4 > no evidence acute infarct or hemorrhage, moderate brain parenchymal volume loss (stable).  ANTIBIOTICS:  Zosyn 12/6 >> 12/12 Vanco 12/9 >> 12/11    CULTURES:  BCx2 12/5 >> negative  Sputum 12/8 >> negative  BCx2 12/9 >>   ASSESSMENT / PLAN:  PULMONARY A: Acute hypoxemic respiratory failure secondary to aspiration PNA Likely pulmonary edema P:   PRVC 8 ccc/kg  Wean PEEP / FiO2 for sats > 92% Ventilator pneumonia prevention protocol Pulmonary toilet See ID Repeat lasix 12/12  CARDIOVASCULAR A:  Borderline hypotension, not in shock - resolved  P:  Telemetry monitoring HD monitoring  RENAL A:   AKI - resolved Hypomagnesemia  P:   Reduce NS to KVO 12/12 Lasix BIDx2 doses KCL / Mg replacement   GASTROINTESTINAL A:   Dysphagia - chronic P:   OG tube Restart TF 12/12 at trickle feeding  Famotidine for stress ulcer prophylaxis  HEMATOLOGIC A:   Anemia - of chronic dz and critical illness Thrombocytopenia - stable P:  Follow CBC SCDs Assess HITT AB  INFECTIOUS A:   Aspiration PNA + ongoing fevers P:   D7/7 abx D/c zosyn after 12/12 dosing F/u cultures  ENDOCRINE A:   Hypothyroidism  P:   Follow glucose Continue synthroid  NEUROLOGIC A:   Intellectual disability Paranoid shizophrenia Sedation needs for vent protocol P:   RASS goal: -1 Continue Precedex infusion, fentanyl gtt per PAD protocol Hold home hydroxizine Resume home gabapentin, ativan, thorazine Continue depakote via tube   FAMILY : - Updates: DSS sent paperwork 06/11/16 - full DNAR without escalation but continue current care (no cvl or vasopressors) towards medical extubation and improvement. If fails, then terminal wean.  DSS would want to be here if patient declines.    - Inter-disciplinary family meet or Palliative Care meeting due by:  12/14   CC Time: 30 minutes  Canary BrimBrandi Arley Salamone, NP-C [place]  [facility] Pgr: [phone] or if no answer [phone] 06/15/2016, 9:42 AM

## 2016-06-15 NOTE — Progress Notes (Signed)
Nutrition Follow-up  DOCUMENTATION CODES:   Not applicable  INTERVENTION:  - Will order Osmolite 1.5 @ 20 mL/hr which will provide 720 kcal, 30 grams of protein, and 366 mL free water. - RD will follow-up 12/13.  NUTRITION DIAGNOSIS:   Inadequate oral intake related to inability to eat as evidenced by NPO status. -ongoing  GOAL:   Patient will meet greater than or equal to 90% of their needs -unmet with TF on hold.   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 He has a legal guardian on file, but staff has been unable to get a hold of them. He was [facility] after presenting with altered mental status, drowsiness, and weakness. Workup in the ED was largely non-diagnostic. CXR and urine without obvious infectious source, MRI brain without obvious encephalopathic etiology. He was admitted by the hospitalist team. Hospital course has been complicated by intermittent agitation (severely aggressive with staff) and he has since developed what is suspected to be aspiration pneumonia on CXR and started on Zosyn. 12/7 his mental status declined and he became minimally responsive. Unable to protect airway. PCCM asked to see for possible intubation.  12/12 Per RN in nurse yesterday, plan was to restart TF after it was placed on hold 12/10 PM d/t hardened abdomen and no BM since admission. Per flow sheet review, it does not appear that TF was restarted yesterday. Spoke with Merry ProudBrandi, PCCM NP, and plan is to start trickle TF via OGT today; OGT currently to LIS with 100cc drainage present at this time. Communicated plan with RN.   Palliative Care NP in room at time of RD visit and brief discussion about tentative POC at this time; will continue to review Palliative Care notes. Pt  continues with no BM since admission.  Weight +3.5 kg since admission at this time with edema present and ongoing diuresis (max weight of +4.9 kg from admission weight). Will continue to use admission weight of 100.2 kg to estimate nutrition needs.  Patient is currently intubated on ventilator support MV: 10.3 L/min Temp (24hrs), Avg:99.6 F (37.6 C), Min:99.1 F (37.3 C), Max:100 F (37.8 C) Propofol: none BP: 131/65 and MAP: 83.   Medications reviewed; 25 mg albumin x1 dose yesterday, PRN Dulcolax, 20 mg IV Ppecid BID, 40 mg IV Lasix BID, 25 mcg Synthroid via OGT/day, 2 g IV Mg sulfate x1 dose today, PRN Zofran, 40 mEq KCl per OGT x1 dose today. Labs reviewed; CBGs: 89 and 93 mg/dL, BUN: 22 mg/dL, Ca: 8.4 mg/dL.  IVF: NS @ 75 mL/hr.  Drips: Fentanyl @ 40 mcg/hr, Precedex @ 0.4 mcg/kg/hr.     12/10 - Patient is receiving Osmolite 1.5 @ 50 ml/hr with 60 ml Prostat BID via OGT which is meeting 95% of kcal and 100% of protein needs.  Patient is currently intubated on ventilator support MV: 11.9 L/min Temp (24hrs), Avg:100.8 F (38.2 C), Min:97.9 F (36.6 C), Max:103 F (39.4 C)   12/8: - OGT in place and is clamped at this time.  - No family/visitors present at time of RD visit.  - Bilateral mitten and soft ankle restraints in place.  - Per chart review, pt ate 50% of breakfast and 25% of lunch yesterday on Dysphagia 1, Heart Healthy diet.  - Physical assessment shows no  muscle or fat wasting at this time, no edema.  - Per chart review, weight -6 lbs from 03/04/16-03/12/16 and he gained 9 lbs from 03/12/16-06/09/16.   Diet Order:   NPO  Skin:  Reviewed, no issues  Last BM:  PTA/unknown  Height:   Ht Readings from Last 1 Encounters:  06/10/16 6\' 2"  (1.88 m)    Weight:   Wt Readings from Last 1 Encounters:  06/15/16 228 lb 9.9 oz (103.7 kg)    Ideal Body Weight:  86.36 kg  BMI:  Body mass index is 29.35 kg/m.  Estimated Nutritional Needs:   Kcal:   2249  Protein:  120-130 grams (1.2-1.3 grams/kg)  Fluid:  2 L/day  EDUCATION NEEDS:   No education needs identified at this time    Trenton GammonJessica Odessa Nishi, MS, RD, LDN, CNSC Inpatient Clinical Dietitian Pager # [phone] After hours/weekend pager # [phone]

## 2016-06-15 NOTE — Progress Notes (Signed)
CSW following to assist with d/c planning. Pt continues to require vent support. Palliative Care Team ids following. CSW will continue to follow to assist with d/c planning.  Cori RazorJamie Tyrin Herbers LCSW [phone]

## 2016-06-16 ENCOUNTER — Inpatient Hospital Stay (HOSPITAL_COMMUNITY): Payer: Medicare Other

## 2016-06-16 ENCOUNTER — Encounter: Payer: Self-pay | Admitting: Pulmonary Disease

## 2016-06-16 DIAGNOSIS — F79 Unspecified intellectual disabilities: Secondary | ICD-10-CM

## 2016-06-16 DIAGNOSIS — J9620 Acute and chronic respiratory failure, unspecified whether with hypoxia or hypercapnia: Secondary | ICD-10-CM

## 2016-06-16 DIAGNOSIS — R410 Disorientation, unspecified: Secondary | ICD-10-CM

## 2016-06-16 DIAGNOSIS — Z515 Encounter for palliative care: Secondary | ICD-10-CM

## 2016-06-16 DIAGNOSIS — R4589 Other symptoms and signs involving emotional state: Secondary | ICD-10-CM

## 2016-06-16 LAB — BASIC METABOLIC PANEL
Anion gap: 7 (ref 5–15)
BUN: 23 mg/dL — ABNORMAL HIGH (ref 6–20)
CALCIUM: 8.4 mg/dL — AB (ref 8.9–10.3)
CHLORIDE: 106 mmol/L (ref 101–111)
CO2: 31 mmol/L (ref 22–32)
Creatinine, Ser: 1.04 mg/dL (ref 0.61–1.24)
GFR calc Af Amer: >60 mL/min (ref >60)
GFR calc non Af Amer: >60 mL/min (ref >60)
GLUCOSE: 124 mg/dL — AB (ref 65–99)
Potassium: 3.1 mmol/L — ABNORMAL LOW (ref 3.5–5.1)
Sodium: 144 mmol/L (ref 135–145)

## 2016-06-16 LAB — PHOSPHORUS: Phosphorus: 3.1 mg/dL (ref 2.5–4.6)

## 2016-06-16 LAB — GLUCOSE, CAPILLARY
GLUCOSE-CAPILLARY: 96 mg/dL (ref 65–99)
Glucose-Capillary: 117 mg/dL — ABNORMAL HIGH (ref 65–99)
Glucose-Capillary: 100 mg/dL — ABNORMAL HIGH (ref 65–99)
Glucose-Capillary: 78 mg/dL (ref 65–99)
Glucose-Capillary: 100 mg/dL — ABNORMAL HIGH (ref 65–99)
Glucose-Capillary: 99 mg/dL (ref 65–99)

## 2016-06-16 LAB — HEPARIN INDUCED PLATELET AB (HIT ANTIBODY): HEPARIN INDUCED PLT AB: 0.421 {OD_unit} — AB (ref 0.000–0.400)

## 2016-06-16 LAB — MAGNESIUM: Magnesium: 1.8 mg/dL (ref 1.7–2.4)

## 2016-06-16 MED ORDER — POTASSIUM CHLORIDE 20 MEQ/15ML (10%) PO SOLN
40.0000 meq | Freq: Once | ORAL | Status: AC
  Administered 2016-06-16: 40 meq via ORAL
  Filled 2016-06-16: qty 30

## 2016-06-16 NOTE — Progress Notes (Signed)
Nutrition Follow-up  DOCUMENTATION CODES:   Not applicable  INTERVENTION:  - RD will follow-up 12/14.  NUTRITION DIAGNOSIS:   Inadequate oral intake related to inability to eat as evidenced by NPO status. -ongoing  GOAL:   Patient will meet greater than or equal to 90% of their needs -unmet with TF previously at trickle rate and now on hold pending one-way extubation.  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 He has a legal guardian on file, but staff has been unable to get a hold of them. [facility] after presenting with altered mental status, drowsiness, and weakness. Workup in the ED was largely non-diagnostic. CXR and urine without obvious infectious source, MRI brain without obvious encephalopathic etiology. He was admitted by the hospitalist team. Hospital course has been complicated by intermittent agitation (severely aggressive with staff) and he has since developed what is suspected to be aspiration pneumonia on CXR and started on Zosyn. 12/7 his mental status declined and he became minimally responsive. Unable to protect airway. PCCM asked to see for possible intubation.  12/13 Pt continues with OGT and TF currently on hold for pending one-way extubation per discussion with RN and review of PCCM NP note. Pt previously receiving trickle TF: Osmolite 1.5 @ 20 mL/hr. Pt had a BM this AM which is first documented since admission (9 days). Weight -0.4 kg from yesterday but is +3.1 kg from admission weight (100.2 kg) which is being used to estimate nutrition needs.   Patient is currently intubated on ventilator support MV: 9.1 L/min Temp (24hrs), Avg:100.4 F (38 C), Min:99.8 F (37.7 C), Max:101.1 F (38.4 C)   Medications reviewed; PRN Dulcolax, 20  mg IV Pepcid BID, 40 mg IV Lasix BID, 25 mcg oral Synthroid/day, 2 g IV Mg sulfate x1 dose yesterday, PRN Zofran, 40 mEq KCl per OGT x1 dose today, 5 mL Senokot BID. Labs reviewed; CBGs: 117 mg/dL this AM, K: 3.1 mmol/L, BUN: 23 mg/dL, Ca: 8.4 mg/dL.  Drips: RN reports all IV sedation off at this time pending extubation and that pt has been breathing on his own this AM.     12/12 - Per RN in nurse yesterday, plan was to restart TF after it was placed on hold 12/10 PM d/t hardened abdomen and no BM since admission.  - Per flow sheet review, it does not appear that TF was restarted yesterday.  - Spoke with Merry ProudBrandi, PCCM NP, and plan is to start trickle TF via OGT today; OGT currently to LIS with 100cc drainage present at this time.  - Palliative Care NP in room at time of RD visit and brief discussion about tentative POC at this time; will continue to review Palliative Care notes.  - Pt continues with no BM since admission. - Weight +3.5 kg since admission at this time with edema present and ongoing diuresis (max weight of +4.9 kg from admission weight).  - Will continue to use admission weight of 100.2 kg to estimate nutrition needs.  Patient is currently intubated on ventilator support MV: 10.3 L/min Temp (24hrs), Avg:99.6 F (37.6 C), Min:99.1 F (37.3 C), Max:100 F (37.8 C) Propofol: none BP: 131/65 and MAP: 83. IVF: NS @ 75 mL/hr.  Drips: Fentanyl @ 40 mcg/hr, Precedex @ 0.4 mcg/kg/hr.  12/10 - Patient is receiving Osmolite 1.5 @ 50 ml/hr with 60 ml Prostat BID via OGT which is meeting 95% of kcal and 100% of protein needs.  Patient is currently intubated on ventilator support MV: 11.9L/min Temp (24hrs), Avg:100.8 F (38.2 C), Min:97.9 F (36.6 C), Max:103 F (39.4 C)   Diet Order:   NPO  Skin:  Reviewed, no issues  Last BM:  12/13  Height:   Ht Readings from Last 1 Encounters:  06/10/16 6\' 2"  (1.88 m)    Weight:   Wt Readings from Last 1 Encounters:   06/16/16 227 lb 11.8 oz (103.3 kg)    Ideal Body Weight:  86.36 kg  BMI:  Body mass index is 29.24 kg/m.  Estimated Nutritional Needs:   Kcal:  2312  Protein:  120-130 grams (1.2-1.3 grams/kg)  Fluid:  2 L/day  EDUCATION NEEDS:   No education needs identified at this time    Trenton GammonJessica Oneil Behney, MS, RD, LDN, CNSC Inpatient Clinical Dietitian Pager # [phone] After hours/weekend pager # [phone]

## 2016-06-16 NOTE — Progress Notes (Addendum)
Daily Progress Note   Patient Name: Jeff Foster       Date: 06/16/2016 DOB: Oct 28, 1960  Age: 55 y.o. MRN#: [id number] Attending Physician: Brand Males, MD Primary Care Physician: Vonna Drafts, FNP Admit Date: 06/10/2016  Reason for Consultation/Follow-up: Establishing goals of care  Subjective: Jeff Foster remains sedated on a vent. Per his care nurse, he is either sedated or agitated and unable to follow commands. No real change from yesterday, with repeat intolerance of vent weaning last night. He did have another fever last night (100.8) and continues to be hypotensive (71/44).   Length of Stay: 8  Current Medications: Scheduled Meds:  . aspirin  81 mg Oral Daily  . chlorhexidine gluconate (MEDLINE KIT)  15 mL Mouth Rinse BID  . chlorproMAZINE  50 mg Oral TID  . famotidine (PEPCID) IV  20 mg Intravenous Q12H  . feeding supplement (OSMOLITE 1.5 CAL)  1,000 mL Per Tube Q24H  . gabapentin  300 mg Per Tube Q8H  . ipratropium-albuterol  3 mL Nebulization Q4H  . levothyroxine  25 mcg Oral QAC breakfast  . LORazepam  1 mg Oral BID  . mouth rinse  15 mL Mouth Rinse QID  . mirtazapine  15 mg Oral QHS  . oxcarbazepine  600 mg Oral BID  . QUEtiapine  200 mg Oral QHS  . sennosides  5 mL Oral BID  . sertraline  100 mg Oral Daily  . trazodone  300 mg Oral QHS  . Valproate Sodium  500 mg Per Tube BID    Continuous Infusions: . sodium chloride 10 mL/hr at 06/15/16 0952  . dexmedetomidine Stopped (06/16/16 0250)  . fentaNYL infusion INTRAVENOUS 50 mcg/hr (06/16/16 0420)    PRN Meds: acetaminophen, bisacodyl, fentaNYL, fentaNYL (SUBLIMAZE) injection, glycopyrrolate, midazolam, ondansetron **OR** ondansetron (ZOFRAN) IV  Physical Exam  Constitutional: He has a sickly appearance. He  is sedated.  HENT:  Head: Normocephalic and atraumatic.  Cardiovascular: Regular rhythm.  Bradycardia present.   Pulmonary/Chest:  Vent. No audible secretions  Abdominal: Soft. Normal appearance.  Musculoskeletal: He exhibits edema (trace in BLE).  Spontaneous movement of extremities. Hands in mitts and wrist restraints in place.   Skin: Skin is warm and dry. There is pallor.         Vital Signs: BP (!) 71/44   Pulse Marland Kitchen)  57   Temp 99.9 F (37.7 C) (Oral)   Resp 15   Ht 6' 2" (1.88 m)   Wt 103.3 kg (227 lb 11.8 oz)   SpO2 98%   BMI 29.24 kg/m  SpO2: SpO2: 98 % O2 Device: O2 Device: Ventilator O2 Flow Rate: O2 Flow Rate (L/min): 40 L/min  Intake/output summary:   Intake/Output Summary (Last 24 hours) at 06/16/16 0740 Last data filed at 06/16/16 0700  Gross per 24 hour  Intake          1646.86 ml  Output             4100 ml  Net         -2453.14 ml   LBM: Last BM Date:  (unknown); multiple BMs overnight 12/12-12/13 per care nurse Baseline Weight: Weight: 91.6 kg (202 lb) Most recent weight: Weight: 103.3 kg (227 lb 11.8 oz)  Flowsheet Rows   Flowsheet Row Most Recent Value  Intake Tab  Referral Department  Hospitalist  Unit at Time of Referral  ICU  Palliative Care Primary Diagnosis  Neurology  Date Notified  06/09/16  Palliative Care Type  Return patient Palliative Care  Reason for referral  Clarify Goals of Care  Date of Admission  06/12/2016  # of days IP prior to Palliative referral  2  Clinical Assessment  Psychosocial & Spiritual Assessment  Palliative Care Outcomes      Patient Active Problem List   Diagnosis Date Noted  . Acute kidney injury ([place]) 06/12/2016  . HCAP (healthcare-associated pneumonia)   . Goals of care, counseling/discussion   . Encounter for intubation   . Hyponatremia 06/08/2016  . Delirium 06/27/2016  . Anxiety, generalized 03/13/2016  . Intermittent explosive disorder 03/13/2016  . Urinary retention 03/10/2016  . Aggressive  behavior 03/06/2016  . Essential hypertension   . Post-ictal confusion 10/27/2015  . Hypothyroidism 10/27/2015  . Thrombocytopenia ([place]) 10/27/2015  . Acute respiratory failure ([place]) 10/27/2015  . Seizure ([place]) 10/27/2015  . Psychosis 10/27/2015  . Normocytic anemia 10/27/2015  . DNR (do not resuscitate) 07/25/2015  . Aspiration pneumonia ([place]) 07/25/2015  . Palliative care encounter 07/25/2015  . Dysphagia   . Altered mental status   . UTI (lower urinary tract infection) 07/18/2015  . Cognitive decline 07/18/2015  . Acute encephalopathy 07/18/2015  . Mental retardation 07/18/2015  . Hypertension 07/18/2015  . History of CVA (cerebrovascular accident) 07/18/2015  . Encephalopathy acute 07/18/2015  . Dehydration     Palliative Care Assessment & Plan   Patient Profile:  55 y.o. male  with past medical history of MR with intermittent explosive disorder, paranoid schizophrenia, seizures, dysphagia with chronic aspiration, and CVA who was admitted on 06/17/2016 after his group home noted he had new tremors, a fall, and altered mental status. Initial work-up was largely non-diagnostic with CXR and urine without obvious infectious source, MRI and CT brain without obvious encephalopathic etiology. He did become increasingly aggressive, which shifted into somnolence and becoming unresponsive after he developed what appears to be aspiration pneumonia. Despite abx, he continues to deteriorate and required intubation on 12/7.   Recommendations/Plan: Goals of care:  Dr. Domingo Cocking had a long discussion with pt's legal guardian, Jeff Foster, on 12/11. During this discussion the plan established was to continue current level of care with no escalation (no pressors, invasive procedures, central line, DNR). Primary team is going to continue to diurese and continue to attempt vent weaning. If he does not improve despite diuresis and is unable to be  vent weaned, the plan is to one-way extubate with full comfort  care.  Jeff Foster has been intubated since 12/7 and remains unable to tolerate vent weaning. His primary team has also been working on diuresing him and feel he is as optimized as he will be with no marked improvement with ongoing interventions.    I spoke with Jeff Foster today, she asked that extubation occur on 12/14 to allow the arrival of a close friend of the pt (cathy). The potential for both rapid and prolonged decline was discussed, and Tye [place] wanted to provide support at the bedside when he is extubated. I have also spoken with the DSS supervisor, Jeff Foster [phone] She requested a letter/documentation from primary team addressing the rationale for extubation and plan for not intubating again if he should go into respiratory failure. She will then use this documentation to confer with her team and will call me with their response (i.e to extubate and not re-intubate). Her fax number was provided to primary team.  Symptoms Constipation (BM 12/13): Continue senna liquid BID, Dulcolax PR PRN,  Secretions (improved overall): Continue robinul PRN Sedation: On Fentanyl and Precedex drip, PRN Fentanyl and Versed  Goals of Care and Additional Recommendations:  Limitations on Scope of Treatment: Continue current therapies, no escalation of care in event of decompensation  Code Status:    Code Status Orders        Start     Ordered   06/11/16 1702  Do not attempt resuscitation (DNR)  Continuous    Question Answer Comment  In the event of cardiac or respiratory ARREST Do not call a "code blue"   In the event of cardiac or respiratory ARREST Do not perform Intubation, CPR, defibrillation or ACLS   In the event of cardiac or respiratory ARREST Use medication by any route, position, wound care, and other measures to relive pain and suffering. May use oxygen, suction and manual treatment of airway obstruction as needed for comfort.      06/11/16 1701    Code Status History    Date  Active Date Inactive Code Status Order ID Comments User Context   06/09/2016 11:27 AM 06/11/2016  5:01 PM Full Code [id number]  Albertine Patricia, MD Inpatient   06/17/2016  8:36 PM 06/09/2016 11:27 AM DNR [id number]  Norval Morton, MD ED   03/12/2016 10:34 PM 03/17/2016  6:26 PM Full Code [id number]  Orlie Dakin, MD ED   03/10/2016  5:23 PM 03/12/2016  9:49 PM Full Code [id number]  Caren Griffins, MD ED   03/10/2016  5:07 PM 03/10/2016  5:23 PM DNR [id number]  Mercy Riding, MD ED   03/04/2016  6:37 PM 03/06/2016  5:16 PM Full Code [id number]  Monico Blitz, PA-C ED   03/04/2016  4:18 PM 03/04/2016  6:37 PM Full Code [id number]  Monico Blitz, PA-C ED   10/27/2015 10:03 PM 10/28/2015  9:06 PM DNR [id number]  Vianne Bulls, MD ED   07/25/2015 11:18 AM 08/07/2015  8:00 PM DNR [id number]  Melton Alar, PA-C Inpatient   07/18/2015  8:49 PM 07/25/2015 11:18 AM Full Code [id number]  Willia Craze, NP Inpatient       Prognosis:   Unable to determine, but his outlook is poor  Discharge Planning:  To Be Determined  Care plan was discussed with Jeff Foster (legal guardian), critical care team  Thank you for allowing the Palliative Medicine Team to assist in the care of this patient.   Time  In: 0745 1120 Time Out: 0820 1130 Total Time 45 minutes Prolonged Time Billed yes      Greater than 50%  of this time was spent counseling and coordinating care related to the above assessment and plan.  Jannette Fogo, NP  Please contact Palliative Medicine Team phone at [phone] for questions and concerns.

## 2016-06-16 NOTE — Progress Notes (Signed)
PULMONARY / CRITICAL CARE MEDICINE   Name: Jeff ChurnJohn Foster MRN: [id number] DOB: Nov 07, 1960    ADMISSION DATE:  06/16/2016 CONSULTATION DATE:  06/10/2016  REFERRING MD:  Dr. Randol KernElgergawy  CHIEF COMPLAINT:  AMS  BRIEF SUMMARY: 55 y/o male with PMH of MR, ward of the state, admitted on 12/4 with confusion.  Later developed aspiration PNA and respiratory failure requiring intubation.     SUBJECTIVE:   RN reports pt developed mild hypotension / bradycardia overnight.  Precedex stopped, fentanyl gtt decreased.  Net negative 2.4L in last 24 hours.  Weaning on SBT PSV 5/5 ~ 30 minutes.     VITAL SIGNS: BP (!) 71/44   Pulse (!) 57   Temp 99.9 F (37.7 C) (Oral)   Resp 15   Ht 6\' 2"  (1.88 m)   Wt 227 lb 11.8 oz (103.3 kg)   SpO2 96%   BMI 29.24 kg/m   HEMODYNAMICS:    VENTILATOR SETTINGS: Vent Mode: PRVC FiO2 (%):  [30 %-40 %] 30 % Set Rate:  [16 bmp] 16 bmp Vt Set:  [102[660 mL] 660 mL PEEP:  [5 cmH20] 5 cmH20 Plateau Pressure:  [18 cmH20-20 cmH20] 19 cmH20  INTAKE / OUTPUT: I/O last 3 completed shifts: In: 3034.4 [I.V.:1966.2; NG/GT:718.2; IV Piggyback:350] Out: 5960 [Urine:5460; Emesis/NG output:500]  PHYSICAL EXAMINATION: General: adult male in NAD on vent  HENT: NCAT, ETT in place PULM: non-labored, lungs bilaterally coarse, scattered rhonchi  CV: RRR, no mgr GI: BS+, soft, nontender MSK: normal bulk and tone Neuro: Sedated on vent, spontaneous movements noted  LABS:  BMET  Recent Labs Lab 06/14/16 0352 06/15/16 0358 06/16/16 0310  NA 143 143 144  K 3.7 3.8 3.1*  CL 108 108 106  CO2 27 29 31   BUN 23* 22* 23*  CREATININE 0.74 0.86 1.04  GLUCOSE 96 91 124*    Electrolytes  Recent Labs Lab 06/14/16 0352 06/15/16 0358 06/16/16 0310  CALCIUM 8.6* 8.4* 8.4*  MG 2.2 1.7 1.8  PHOS 3.5 3.6 3.1    CBC  Recent Labs Lab 06/12/16 0348 06/14/16 0352 06/15/16 0358  WBC 12.4* 9.5 7.1  HGB 9.2* 8.8* 8.3*  HCT 28.1* 26.6* 23.9*  PLT 56* 80* 99*     Coag's No results for input(s): APTT, INR in the last 168 hours.  Sepsis Markers  Recent Labs Lab 06/10/16 2259 06/11/16 0327 06/12/16 0348 06/13/16 0328  LATICACIDVEN 1.0  --   --   --   PROCALCITON  --  0.35 0.36 0.29    ABG  Recent Labs Lab 06/10/16 2300 06/11/16 0100  PHART 7.339* 7.403  PCO2ART 59.6* 49.6*  PO2ART 70.6* 143*    Liver Enzymes  Recent Labs Lab 06/10/16 2259  AST 80*  ALT 18  ALKPHOS 69  BILITOT 0.9  ALBUMIN 3.3*    Cardiac Enzymes No results for input(s): TROPONINI, PROBNP in the last 168 hours.  Glucose  Recent Labs Lab 06/15/16 1112 06/15/16 1613 06/15/16 1933 06/15/16 2311 06/16/16 0318 06/16/16 0848  GLUCAP 103* 103* 102* 106* 117* 100*    Imaging Dg Chest Port 1 View  Result Date: 06/16/2016 CLINICAL DATA:  Respiratory failure. EXAM: PORTABLE CHEST 1 VIEW COMPARISON:  06/15/2016. FINDINGS: Endotracheal tube and NG tube in stable position. Heart size normal. Bilateral pulmonary infiltrates/edema, right side greater than left again noted. Slight improvement from prior exam . Small bilateral effusions. No pneumothorax. IMPRESSION: 1. Lines and tubes in stable position. 2. Bilateral pulmonary infiltrates/edema, right side greater than left again noted. Slight  improvement from prior exam. Small bilateral pleural effusions. Electronically Signed   By: Maisie Fushomas  Register   On: 06/16/2016 06:37   EVENTS 12/04 - admit 12/07 - Intubated for airway protection 12/07 - Sedated or agitated 12/09 - Remains on 70%, oliguric.  Full DNR w/o vasopressors or escalation established.  Continue current Rx 12/10 - agitated, on fent gtt + versed prn. Febrile 103F, vanc added 12/11 - ongoing fever, failed SBT.  Lasix > net neg 2.5L 12/13 - neg balance, weaning on 5/5  STUDIES:  MRI brain 12/4 > no evidence acute infarct or hemorrhage, moderate brain parenchymal volume loss (stable).  ANTIBIOTICS:  Zosyn 12/6 >> 12/12 Vanco 12/9 >> 12/11     CULTURES:  BCx2 12/5 >> negative  Sputum 12/8 >> negative  BCx2 12/9 >>   ASSESSMENT / PLAN:  PULMONARY A: Acute hypoxemic respiratory failure secondary to aspiration PNA Likely pulmonary edema P:   PRVC 8 ccc/kg  Wean PEEP / FiO2 for sats > 92% Daily SBT / WUA  Consider extubation 12/13  Ventilator pneumonia prevention protocol Pulmonary toilet See ID Intermittent CXR   CARDIOVASCULAR A:  Borderline hypotension, not in shock - resolved  P:  Telemetry monitoring HD monitoring Hold lasix 12/13 with soft pressures overnight  RENAL A:   AKI - resolved Hypomagnesemia  P:   Reduce NS to KVO 12/12 Trend BMP / UOP  Replace electrolytes as indicated  Hold further diuresis 12/13   GASTROINTESTINAL A:   Dysphagia - chronic P:   OG tube Restart TF 12/12, trickle feeding  Famotidine for stress ulcer prophylaxis  HEMATOLOGIC A:   Anemia - of chronic dz and critical illness Thrombocytopenia - stable P:  Follow CBC SCDs Assess HITT AB >  INFECTIOUS A:   Aspiration PNA + ongoing fevers P:   Completed 7 d zosyn F/u cultures  ENDOCRINE A:   Hypothyroidism  P:   Follow glucose Continue synthroid  NEUROLOGIC A:   Intellectual disability Paranoid shizophrenia Sedation needs for vent protocol P:   RASS goal: 0 to -1 Continue Fentanyl gtt per PAD protocol Discontinue precedex Hold home hydroxizine Continue home gabapentin, ativan, thorazine Continue depakote via tube   FAMILY : - Updates: DSS sent paperwork 06/11/16 - full DNAR without escalation but continue current care (no cvl or vasopressors) towards medical extubation and improvement. If fails, then terminal wean.  DSS would want to be here if patient declines.    - Inter-disciplinary family meet or Palliative Care meeting due by:  12/14   CC Time: 30 minutes  Canary BrimBrandi Tuwanda Vokes, NP-C [facility] Pgr: [phone] or if no answer [phone] 06/16/2016, 8:51 AM

## 2016-06-17 ENCOUNTER — Inpatient Hospital Stay (HOSPITAL_COMMUNITY): Payer: Medicare Other

## 2016-06-17 DIAGNOSIS — J9621 Acute and chronic respiratory failure with hypoxia: Secondary | ICD-10-CM

## 2016-06-17 LAB — GLUCOSE, CAPILLARY
GLUCOSE-CAPILLARY: 111 mg/dL — AB (ref 65–99)
GLUCOSE-CAPILLARY: 113 mg/dL — AB (ref 65–99)
GLUCOSE-CAPILLARY: 115 mg/dL — AB (ref 65–99)
GLUCOSE-CAPILLARY: 90 mg/dL (ref 65–99)
GLUCOSE-CAPILLARY: 94 mg/dL (ref 65–99)
Glucose-Capillary: 94 mg/dL (ref 65–99)

## 2016-06-17 LAB — BASIC METABOLIC PANEL
Anion gap: 7 (ref 5–15)
BUN: 27 mg/dL — ABNORMAL HIGH (ref 6–20)
CHLORIDE: 110 mmol/L (ref 101–111)
CO2: 31 mmol/L (ref 22–32)
Calcium: 8.4 mg/dL — ABNORMAL LOW (ref 8.9–10.3)
Creatinine, Ser: 1.03 mg/dL (ref 0.61–1.24)
GFR calc non Af Amer: >60 mL/min (ref >60)
Glucose, Bld: 107 mg/dL — ABNORMAL HIGH (ref 65–99)
POTASSIUM: 3.6 mmol/L (ref 3.5–5.1)
SODIUM: 148 mmol/L — AB (ref 135–145)

## 2016-06-17 LAB — PHOSPHORUS: Phosphorus: 3 mg/dL (ref 2.5–4.6)

## 2016-06-17 LAB — MAGNESIUM: Magnesium: 2.1 mg/dL (ref 1.7–2.4)

## 2016-06-17 MED ORDER — LIP MEDEX EX OINT
TOPICAL_OINTMENT | CUTANEOUS | Status: AC
  Administered 2016-06-17: 18:00:00
  Filled 2016-06-17: qty 7

## 2016-06-17 MED ORDER — OSMOLITE 1.5 CAL PO LIQD
1000.0000 mL | ORAL | Status: DC
  Filled 2016-06-17: qty 1000

## 2016-06-17 MED ORDER — PRO-STAT SUGAR FREE PO LIQD: 30.0000 mL | Freq: Three times a day (TID) | ORAL | Status: DC

## 2016-06-17 MED ORDER — FREE WATER: 100.0000 mL | Freq: Four times a day (QID) | Status: DC

## 2016-06-17 NOTE — Procedures (Signed)
Extubation Procedure Note  Patient Details:   Name: Jeff ChurnJohn Dement DOB: [birth year] MRN: [id number]   Airway Documentation:     Evaluation  O2 sats: 91 Complications: none Patient tolerated procedure well. Bilateral Breath Sounds: Diminished, Rhonchi   Pt not speaking  Per CCM order, pt extubated, placed on nasal cannula.   No complications, pt tolerated well.  Revonda HumphreyDePue, Muhannad Bignell S 06/17/2016, 12:26 PM

## 2016-06-17 NOTE — Progress Notes (Addendum)
Nutrition Follow-up  DOCUMENTATION CODES:   Not applicable  INTERVENTION:  - If TF to continue s/p extubation today, 30 mL Prostat TID with 100 mL free water QID and Osmolite 1.5 @ 30 mL/hr to advance by 10 mL every 6 hours to reach goal rate of Osmolite 1.5 @ 50 mL/hr. At goal rate, this regimen will provide 2100 kcal, 120 grams of protein, and 1314 mL free water.  -RD will follow-up 12/15.  NUTRITION DIAGNOSIS:   Inadequate oral intake related to inability to eat as evidenced by NPO status. -ongoing  GOAL:   Patient will meet greater than or equal to 90% of their needs -unmet with current TF rate.   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 He has a legal guardian on file, but staff has been unable to get a hold of them. [facility] after presenting with altered mental status, drowsiness, and weakness. Workup in the ED was largely non-diagnostic. CXR and urine without obvious infectious source, MRI brain without obvious encephalopathic etiology. He was admitted by the hospitalist team. Hospital course has been complicated by intermittent agitation (severely aggressive with staff) and he has since developed what is suspected to be aspiration pneumonia on CXR and started on Zosyn. 12/7 his mental status declined and he became minimally responsive. Unable to protect airway. PCCM asked to see for possible intubation.  12/14 Pt continues with OGT and is currently receiving TF at ordered rate of Osmolite 1.5 @ 20 mL/hr which is providing 720 kcal, 30 grams of protein, and 366 mL free water. Estimated nutrition needs updated this AM based on admission weight of 100.2 kg (weight currently +1.3 kg since that time). Needs not based on ventilator as plan for one-way extubation today.  PCCM notes indicate evidence that pt is able to breath on his own. Palliative notes reviewed. TF regimen outlined above should TF continue s/p extubation.   Patient is currently intubated on ventilator support MV: 9.7 L/min Temp (24hrs), Avg:99.8 F (37.7 C), Min:99.2 F (37.3 C), Max:100.5 F (38.1 C) BP: 116/58 and MAP: 76.   Medications reviewed; PRN Dulcolax, 20 mg IV Pepcid BID, 25 mcg Synthroid per OGT/day, PRN Zofran, 5 mL Senokot BID.  Labs reviewed; CBGs: 94-115 mg/dL today, Na: 829148 mmol/L, BUN: 27 mg/dL, Ca: 8.4 mg/dL.  Drip: Fentanyl @ 100 mcg/hr.     12/13 - Pt continues with OGT and TF currently on hold for pending one-way extubation per discussion with RN and review of PCCM NP note.  - Pt previously receiving trickle TF: Osmolite 1.5 @ 20 mL/hr.  - Pt had a BM this AM which is first documented since admission (9 days).  - Weight -0.4 kg from yesterday but is +3.1 kg from admission weight (100.2 kg) which is being used to estimate nutrition needs.   Patient is currently intubated on ventilator support MV: 9.1 L/min Temp (24hrs), Avg:100.4 F (38 C), Min:99.8 F (37.7 C), Max:101.1 F (38.4 C)  Drips: RN reports all IV sedation off at this time pending extubation and that pt has been breathing on his own this AM.     12/12 - Per RN in nurse yesterday, plan was to restart TF after it was placed on hold 12/10 PM d/t hardened abdomen and no BM since admission.  -  Per flow sheet review, it does not appear that TF was restarted yesterday.  - Spoke with Merry ProudBrandi, PCCM NP, and plan is to start trickle TF via OGT today; OGT currently to LIS with 100cc drainage present at this time.  - Palliative Care NP in room at time of RD visit and brief discussion about tentative POC at this time; will continue to review Palliative Care notes.  - Pt continues with no BM since admission. - Weight +3.5 kg since admission at this time with edema present and ongoing diuresis (max weight  of +4.9 kg from admission weight).  - Will continue to use admission weight of 100.2 kg to estimate nutrition needs.  Patient is currently intubated on ventilator support MV: 10.3L/min Temp (24hrs), Avg:99.6 F (37.6 C), Min:99.1 F (37.3 C), Max:100 F (37.8 C) Propofol: none BP: 131/65 and MAP: 83. IVF: NS @ 75 mL/hr.  Drips:Fentanyl @ 40 mcg/hr, Precedex @ 0.4 mcg/kg/hr.    Diet Order:   NPO  Skin:  Reviewed, no issues  Last BM:  12/14  Height:   Ht Readings from Last 1 Encounters:  06/10/16 6\' 2"  (1.88 m)    Weight:   Wt Readings from Last 1 Encounters:  06/17/16 223 lb 12.3 oz (101.5 kg)    Ideal Body Weight:  86.36 kg  BMI:  Body mass index is 28.73 kg/m.  Estimated Nutritional Needs:   Kcal:  2004-2204 (20-22 kcal/kg)  Protein:  120-130 grams (1.4-1.5 grams/kg IBW)  Fluid:  2 L/day  EDUCATION NEEDS:   No education needs identified at this time    Trenton GammonJessica Nakeia Calvi, MS, RD, LDN, CNSC Inpatient Clinical Dietitian Pager # [phone] After hours/weekend pager # [phone]

## 2016-06-17 NOTE — Progress Notes (Signed)
Daily Progress Note   Patient Name: Jeff Foster       Date: 06/17/2016 DOB: 10-05-1960  Age: 55 y.o. MRN#: [id number] Attending Physician: Brand Males, MD Primary Care Physician: Vonna Drafts, FNP Admit Date: 06/09/2016  Reason for Consultation/Follow-up: Establishing goals of care  Subjective: Jeff Foster remains sedated on a vent. He was weaned yesterday, with good tolerance. Yesterday's CXR also noted slight improvement. Plan to extubate today. Per critical care team, there are indications that he can breathe on his own, however he has significant risk for recurrent respiratory failure. In light of his comorbid conditions and the overall goals of care for this man, we would not plan to re-intubate. DSS supervisor, Lum Babe and pt's DSS appointed legal guardian, Bascom Levels, are both in agreement.   Length of Stay: 9  Current Medications: Scheduled Meds:  . aspirin  81 mg Oral Daily  . chlorhexidine gluconate (MEDLINE KIT)  15 mL Mouth Rinse BID  . chlorproMAZINE  50 mg Oral TID  . famotidine (PEPCID) IV  20 mg Intravenous Q12H  . feeding supplement (OSMOLITE 1.5 CAL)  1,000 mL Per Tube Q24H  . gabapentin  300 mg Per Tube Q8H  . ipratropium-albuterol  3 mL Nebulization Q4H  . levothyroxine  25 mcg Oral QAC breakfast  . LORazepam  1 mg Oral BID  . mouth rinse  15 mL Mouth Rinse QID  . mirtazapine  15 mg Oral QHS  . oxcarbazepine  600 mg Oral BID  . QUEtiapine  200 mg Oral QHS  . sennosides  5 mL Oral BID  . sertraline  100 mg Oral Daily  . trazodone  300 mg Oral QHS  . Valproate Sodium  500 mg Per Tube BID    Continuous Infusions: . sodium chloride 10 mL/hr at 06/17/16 0700  . fentaNYL infusion INTRAVENOUS 50 mcg/hr (06/17/16 0700)    PRN Meds: acetaminophen,  bisacodyl, fentaNYL, fentaNYL (SUBLIMAZE) injection, glycopyrrolate, midazolam, ondansetron **OR** ondansetron (ZOFRAN) IV  Physical Exam  Constitutional: He has a sickly appearance. He is sedated.  HENT:  Head: Normocephalic and atraumatic.  Cardiovascular: Regular rhythm.  Bradycardia present.   Pulmonary/Chest:  Vent. No audible secretions  Abdominal: Soft. Normal appearance.  Musculoskeletal: He exhibits edema (trace in BLE).  Spontaneous movement of extremities. Hands in mitts and wrist restraints in  place.   Skin: Skin is warm and dry. There is pallor.  Psychiatric:  He is more restless today         Vital Signs: BP (!) 119/49   Pulse (!) 58   Temp 99.2 F (37.3 C) (Axillary)   Resp 17   Ht _0  (1.88 m)   Wt 101.5 kg (223 lb 12.3 oz)   SpO2 99%   BMI 28.73 kg/m  SpO2: SpO2: 99 % O2 Device: O2 Device: Ventilator O2 Flow Rate: O2 Flow Rate (L/min): 40 L/min  Intake/output summary:   Intake/Output Summary (Last 24 hours) at 06/17/16 2426 Last data filed at 06/17/16 0700  Gross per 24 hour  Intake          1196.01 ml  Output             1190 ml  Net             6.01 ml   LBM: Last BM Date: 06/16/16 Baseline Weight: Weight: 91.6 kg (202 lb) Most recent weight: Weight: 101.5 kg (223 lb 12.3 oz)  Flowsheet Rows   Flowsheet Row Most Recent Value  Intake Tab  Referral Department  Hospitalist  Unit at Time of Referral  ICU  Palliative Care Primary Diagnosis  Neurology  Date Notified  06/09/16  Palliative Care Type  Return patient Palliative Care  Reason for referral  Clarify Goals of Care  Date of Admission  06/06/2016  # of days IP prior to Palliative referral  2  Clinical Assessment  Psychosocial & Spiritual Assessment  Palliative Care Outcomes      Patient Active Problem List   Diagnosis Date Noted  . Acute on chronic respiratory failure ([place])   . Acute kidney injury ([place]) 06/12/2016  . HCAP (healthcare-associated pneumonia)   . Goals of care,  counseling/discussion   . Encounter for intubation   . Hyponatremia 06/08/2016  . Delirium 06/18/2016  . Anxiety, generalized 03/13/2016  . Intermittent explosive disorder 03/13/2016  . Urinary retention 03/10/2016  . Aggressive behavior 03/06/2016  . Essential hypertension   . Post-ictal confusion 10/27/2015  . Hypothyroidism 10/27/2015  . Thrombocytopenia ([place]) 10/27/2015  . Acute respiratory failure with hypoxemia ([place]) 10/27/2015  . Seizure ([place]) 10/27/2015  . Psychosis 10/27/2015  . Normocytic anemia 10/27/2015  . DNR (do not resuscitate) 07/25/2015  . Aspiration pneumonia ([place]) 07/25/2015  . Palliative care encounter 07/25/2015  . Dysphagia   . Altered mental status   . UTI (lower urinary tract infection) 07/18/2015  . Cognitive decline 07/18/2015  . Acute encephalopathy 07/18/2015  . Mental retardation 07/18/2015  . Hypertension 07/18/2015  . History of CVA (cerebrovascular accident) 07/18/2015  . Encephalopathy acute 07/18/2015  . Dehydration     Palliative Care Assessment & Plan   Patient Profile:  55 y.o. male  with past medical history of MR with intermittent explosive disorder, paranoid schizophrenia, seizures, dysphagia with chronic aspiration, and CVA who was admitted on 07/03/2016 after his group home noted he had new tremors, a fall, and altered mental status. Initial work-up was largely non-diagnostic with CXR and urine without obvious infectious source, MRI and CT brain without obvious encephalopathic etiology. He did become increasingly aggressive, which shifted into somnolence and becoming unresponsive after he developed what appears to be aspiration pneumonia. Despite abx, he continues to deteriorate and required intubation on 12/7.   Recommendations/Plan: Goals of care:  Dr. Domingo Cocking had a long discussion with pt's legal guardian, Lanetta Inch, on 12/11. During this discussion the plan  established was to continue current level of care with no escalation (no  pressors, invasive procedures, central line, DNR). Primary team is going to continue to diurese and continue to attempt vent weaning. If he does not improve despite diuresis and is unable to be vent weaned, the plan is to one-way extubate with full comfort care.  On 12/13 Jeff Foster was able to tolerate vent wean. His most recent CXR showed some improvement, and he has diuresed well. Given these things, there are signs that he may breath on his own. Furthermore, per CC team "prolonging mechanical ventilation at this point would not provide medical benefit," so once extubated they will not re-intubate if he develops respiratory failure. This has been discussed with his legal guardian, and approved by DSS Malachy Mood [place]). We are presently waiting on a friend of the pt's to arrive at the bedside to provide support during extubation (unclear on her arrival time, message left with Stacie to clarify eta. Plan to extubate at noon if she is not present before then).    Symptoms Constipation (BM 12/13): Continue senna liquid BID, Dulcolax PR PRN,  Secretions (improved overall): Continue robinul PRN Sedation: On Fentanyl drip, PRN Fentanyl and Versed; CC to manage extubation order set  Goals of Care and Additional Recommendations:  Limitations on Scope of Treatment: Continue current therapies, no escalation of care in event of decompensation  Code Status:    Code Status Orders        Start     Ordered   06/11/16 1702  Do not attempt resuscitation (DNR)  Continuous    Question Answer Comment  In the event of cardiac or respiratory ARREST Do not call a "code blue"   In the event of cardiac or respiratory ARREST Do not perform Intubation, CPR, defibrillation or ACLS   In the event of cardiac or respiratory ARREST Use medication by any route, position, wound care, and other measures to relive pain and suffering. May use oxygen, suction and manual treatment of airway obstruction as needed for comfort.        06/11/16 1701    Code Status History    Date Active Date Inactive Code Status Order ID Comments User Context   06/09/2016 11:27 AM 06/11/2016  5:01 PM Full Code [id number]  Albertine Patricia, MD Inpatient   06/14/2016  8:36 PM 06/09/2016 11:27 AM DNR [id number]  Norval Morton, MD ED   03/12/2016 10:34 PM 03/17/2016  6:26 PM Full Code [id number]  Orlie Dakin, MD ED   03/10/2016  5:23 PM 03/12/2016  9:49 PM Full Code [id number]  Caren Griffins, MD ED   03/10/2016  5:07 PM 03/10/2016  5:23 PM DNR [id number]  Mercy Riding, MD ED   03/04/2016  6:37 PM 03/06/2016  5:16 PM Full Code [id number]  Monico Blitz, PA-C ED   03/04/2016  4:18 PM 03/04/2016  6:37 PM Full Code [id number]  Monico Blitz, PA-C ED   10/27/2015 10:03 PM 10/28/2015  9:06 PM DNR [id number]  Vianne Bulls, MD ED   07/25/2015 11:18 AM 08/07/2015  8:00 PM DNR [id number]  Melton Alar, PA-C Inpatient   07/18/2015  8:49 PM 07/25/2015 11:18 AM Full Code [id number]  Willia Craze, NP Inpatient       Prognosis:   Unable to determine, but his outlook is poor  Discharge Planning:  To Be Determined  Care plan was discussed with Lanetta Inch (legal guardian), critical care team  Thank you for allowing the Palliative Medicine  Team to assist in the care of this patient.   Time In: 0730 Time Out: 0800 Total Time 30 minutes Prolonged Time Billed no      Greater than 50%  of this time was spent counseling and coordinating care related to the above assessment and plan.  Jannette Fogo, NP  Please contact Palliative Medicine Team phone at [phone] for questions and concerns.

## 2016-06-17 NOTE — Progress Notes (Signed)
Patients friends at bedside.  Weaning on PSV > proceed with extubation.    Canary BrimBrandi Khamani Fairley, NP-C [place] [facility] Pgr: [phone] or if no answer [phone] 06/17/2016, 11:42 AM

## 2016-06-17 NOTE — Progress Notes (Signed)
Removed all sedation after patient was extubated. Turned off Fentanyl and wasted in sink with Estill Doomsasheena Adhahn, RN at 1300. Was unable to waste via Pyxis.  Jacksen Isip Neysa Bonitohristy Erby PianWelborn,RN

## 2016-06-17 NOTE — Progress Notes (Signed)
eLink Physician-Brief Progress Note Patient Name: Genene ChurnJohn Dibella DOB: Jun 25, 1961 MRN: [id number]   Date of Service  06/17/2016  HPI/Events of Note  Sats decreased to 87%. [place] O2 increased and posterior pharynx suctioned  >> sats now = 93% and RR = 24. No order for comfort measures on chart.  eICU Interventions  Will observe patient for present. Will need to contact legal guardian to confirm comfort measures status prior to medication with opioids for comfort.      Intervention Category Intermediate Interventions: Respiratory distress - evaluation and management  Leocadia Idleman Eugene 06/17/2016, 4:20 PM

## 2016-06-17 NOTE — Progress Notes (Signed)
eLink Physician-Brief Progress Note Patient Name: Jeff ChurnJohn Foster DOB: 22-Jan-1961 MRN: [id number]   Date of Service  06/17/2016  HPI/Events of Note  Multiple issues: 1. Patient is NPO and on PO meds and 2. Request for bilateral wrist restraints.   eICU Interventions  Will order: 1. Replace NGT. 2. Bilateral soft wrist restraints.        Eeva Schlosser Eugene 06/17/2016, 3:21 PM

## 2016-06-17 NOTE — Progress Notes (Signed)
Date:  June 17, 2016 Chart reviewed for concurrent status and case management needs. Will continue to follow patient progress. Discharge Planning: following for needs Expected discharge date: 06/29/2016 Kyisha Fowle, BSN, RN3, CCM   [phone] 

## 2016-06-17 NOTE — Progress Notes (Signed)
PULMONARY / CRITICAL CARE MEDICINE   Name: Jeff Foster MRN: [id number] DOB: 01-23-1961    ADMISSION DATE:  06/27/2016 CONSULTATION DATE:  06/10/2016  REFERRING MD:  Dr. Randol KernElgergawy  CHIEF COMPLAINT:  AMS  BRIEF SUMMARY: 55 y/o male with PMH of MR, ward of the state, admitted on 12/4 with confusion.  Later developed aspiration PNA and respiratory failure requiring intubation.     SUBJECTIVE:   RN reports no acute events.  Remains on 40 mcg fentanyl.  Plan for wean / extubation later in day on arrival of friend to be with patient.      VITAL SIGNS: BP (!) 119/49   Pulse (!) 58   Temp 99.2 F (37.3 C) (Axillary)   Resp 17   Ht 6\' 2"  (1.88 m)   Wt 223 lb 12.3 oz (101.5 kg)   SpO2 99%   BMI 28.73 kg/m   HEMODYNAMICS:    VENTILATOR SETTINGS: Vent Mode: PRVC FiO2 (%):  [40 %-50 %] 40 % Set Rate:  [16 bmp] 16 bmp Vt Set:  [660 mL] 660 mL PEEP:  [5 cmH20] 5 cmH20 Pressure Support:  [5 cmH20-8 cmH20] 8 cmH20 Plateau Pressure:  [18 cmH20-26 cmH20] 18 cmH20  INTAKE / OUTPUT: I/O last 3 completed shifts: In: 1692.5 [I.V.:530.5; NG/GT:962; IV Piggyback:200] Out: 1940 [Urine:1940]  PHYSICAL EXAMINATION: General: adult male in NAD on vent  HENT: NCAT, ETT in place PULM: non-labored, lungs bilaterally coarse  CV: RRR, no mgr GI: BS+, soft, nontender MSK: normal bulk and tone Neuro: Sedated on vent, spontaneous movements noted but no follow commands  LABS:  BMET  Recent Labs Lab 06/15/16 0358 06/16/16 0310 06/17/16 0316  NA 143 144 148*  K 3.8 3.1* 3.6  CL 108 106 110  CO2 29 31 31   BUN 22* 23* 27*  CREATININE 0.86 1.04 1.03  GLUCOSE 91 124* 107*    Electrolytes  Recent Labs Lab 06/15/16 0358 06/16/16 0310 06/17/16 0316  CALCIUM 8.4* 8.4* 8.4*  MG 1.7 1.8 2.1  PHOS 3.6 3.1 3.0    CBC  Recent Labs Lab 06/12/16 0348 06/14/16 0352 06/15/16 0358  WBC 12.4* 9.5 7.1  HGB 9.2* 8.8* 8.3*  HCT 28.1* 26.6* 23.9*  PLT 56* 80* 99*    Coag's No results  for input(s): APTT, INR in the last 168 hours.  Sepsis Markers  Recent Labs Lab 06/10/16 2259 06/11/16 0327 06/12/16 0348 06/13/16 0328  LATICACIDVEN 1.0  --   --   --   PROCALCITON  --  0.35 0.36 0.29    ABG  Recent Labs Lab 06/10/16 2300 06/11/16 0100  PHART 7.339* 7.403  PCO2ART 59.6* 49.6*  PO2ART 70.6* 143*    Liver Enzymes  Recent Labs Lab 06/10/16 2259  AST 80*  ALT 18  ALKPHOS 69  BILITOT 0.9  ALBUMIN 3.3*    Cardiac Enzymes No results for input(s): TROPONINI, PROBNP in the last 168 hours.  Glucose  Recent Labs Lab 06/16/16 1404 06/16/16 1558 06/16/16 2017 06/17/16 0011 06/17/16 0351 06/17/16 0753  GLUCAP 96 100* 99 113* 115* 94    Imaging No results found.   EVENTS 12/04 - admit 12/07 - Intubated for airway protection 12/07 - Sedated or agitated 12/09 - Remains on 70%, oliguric.  Full DNR w/o vasopressors or escalation established.  Continue current Rx 12/10 - agitated, on fent gtt + versed prn. Febrile 103F, vanc added 12/11 - ongoing fever, failed SBT.  Lasix > net neg 2.5L 12/13 - neg balance, weaning on 5/5  STUDIES:  MRI brain 12/4 > no evidence acute infarct or hemorrhage, moderate brain parenchymal volume loss (stable).  ANTIBIOTICS:  Zosyn 12/6 >> 12/12 Vanco 12/9 >> 12/11    CULTURES:  BCx2 12/5 >> negative  Sputum 12/8 >> negative  BCx2 12/9 >>   ASSESSMENT / PLAN:  PULMONARY A: Acute hypoxemic respiratory failure secondary to aspiration PNA Likely pulmonary edema P:   PRVC 8 ccc/kg  Wean PEEP / FiO2 for sats > 92% Daily SBT / WUA  Plan for extubation 12/14, no reintubation  VAP prevention measures Pulmonary toilet See ID Intermittent CXR   CARDIOVASCULAR A:  Borderline hypotension, not in shock - resolved  P:  Telemetry monitoring HD monitoring  RENAL A:   AKI - resolved Hypomagnesemia  P:   NS @ KVO Trend BMP / UOP  Replace electrolytes as indicated   GASTROINTESTINAL A:   Dysphagia -  chronic P:   OG tube TF per nutrition  Famotidine for stress ulcer prophylaxis  HEMATOLOGIC A:   Anemia - of chronic dz and critical illness Thrombocytopenia - stable P:  Follow CBC SCDs Assess HITT AB > mildly elevated, no SRA completed.  Hold further testing for now  INFECTIOUS A:   Aspiration PNA + ongoing fevers P:   Completed 7 d zosyn F/u cultures  ENDOCRINE A:   Hypothyroidism  P:   Follow glucose Continue synthroid  NEUROLOGIC A:   Intellectual disability Paranoid shizophrenia Sedation needs for vent protocol P:   RASS goal: 0 to -1 Continue Fentanyl gtt per PAD protocol Hold home hydroxizine Continue home gabapentin, ativan, thorazine Continue depakote via tube   FAMILY : - Updates: DSS sent paperwork 06/11/16 - full DNR without escalation but continue current care (no cvl or vasopressors) towards medical extubation and improvement. If fails, then terminal wean.  DSS would want to be here if patient declines.  Pt weaning on PSV successfully. Plan for extubation with no reintubation.    - Inter-disciplinary family meet or Palliative Care meeting due by:  12/14   CC Time: 30 minutes  Canary BrimBrandi Azalynn Maxim, NP-C [place] [facility] Pgr: [phone] or if no answer [phone] 06/17/2016, 8:13 AM

## 2016-06-18 DIAGNOSIS — Z4659 Encounter for fitting and adjustment of other gastrointestinal appliance and device: Secondary | ICD-10-CM

## 2016-06-18 LAB — CULTURE, BLOOD (ROUTINE X 2)
Culture: NO GROWTH
Culture: NO GROWTH

## 2016-06-18 LAB — CBC
HEMATOCRIT: 20.4 % — AB (ref 39.0–52.0)
Hemoglobin: 7.5 g/dL — ABNORMAL LOW (ref 13.0–17.0)
MCH: 36.8 pg — AB (ref 26.0–34.0)
MCHC: 36.8 g/dL — AB (ref 30.0–36.0)
MCV: 100 fL (ref 78.0–100.0)
Platelets: 182 10*3/uL (ref 150–400)
RBC: 2.04 MIL/uL — ABNORMAL LOW (ref 4.22–5.81)
RDW: 24.5 % — AB (ref 11.5–15.5)
WBC: 7 10*3/uL (ref 4.0–10.5)

## 2016-06-18 LAB — BASIC METABOLIC PANEL
Anion gap: 5 (ref 5–15)
BUN: 18 mg/dL (ref 6–20)
CHLORIDE: 112 mmol/L — AB (ref 101–111)
CO2: 30 mmol/L (ref 22–32)
Calcium: 8.5 mg/dL — ABNORMAL LOW (ref 8.9–10.3)
Creatinine, Ser: 0.78 mg/dL (ref 0.61–1.24)
GFR calc Af Amer: >60 mL/min (ref >60)
GFR calc non Af Amer: >60 mL/min (ref >60)
GLUCOSE: 90 mg/dL (ref 65–99)
POTASSIUM: 4.3 mmol/L (ref 3.5–5.1)
Sodium: 147 mmol/L — ABNORMAL HIGH (ref 135–145)

## 2016-06-18 LAB — GLUCOSE, CAPILLARY
GLUCOSE-CAPILLARY: 104 mg/dL — AB (ref 65–99)
GLUCOSE-CAPILLARY: 81 mg/dL (ref 65–99)
GLUCOSE-CAPILLARY: 84 mg/dL (ref 65–99)
GLUCOSE-CAPILLARY: 96 mg/dL (ref 65–99)
Glucose-Capillary: 89 mg/dL (ref 65–99)
Glucose-Capillary: 81 mg/dL (ref 65–99)
Glucose-Capillary: 93 mg/dL (ref 65–99)

## 2016-06-18 MED ORDER — LORAZEPAM 2 MG/ML IJ SOLN
0.5000 mg | INTRAMUSCULAR | Status: DC | PRN
  Administered 2016-06-18 – 2016-06-19 (×2): 0.5 mg via INTRAVENOUS
  Filled 2016-06-18 (×2): qty 1

## 2016-06-18 MED ORDER — MIRTAZAPINE 15 MG PO TBDP
15.0000 mg | ORAL_TABLET | Freq: Every day | ORAL | Status: DC
  Administered 2016-06-18: 15 mg via ORAL
  Filled 2016-06-18 (×2): qty 1

## 2016-06-18 MED ORDER — ALBUTEROL SULFATE (2.5 MG/3ML) 0.083% IN NEBU: 2.5000 mg | INHALATION_SOLUTION | RESPIRATORY_TRACT | Status: DC | PRN

## 2016-06-18 MED ORDER — IPRATROPIUM-ALBUTEROL 0.5-2.5 (3) MG/3ML IN SOLN
3.0000 mL | Freq: Four times a day (QID) | RESPIRATORY_TRACT | Status: DC
  Administered 2016-06-18 – 2016-06-20 (×8): 3 mL via RESPIRATORY_TRACT
  Filled 2016-06-18 (×8): qty 3

## 2016-06-18 MED ORDER — VALPROIC ACID 250 MG PO CAPS
500.0000 mg | ORAL_CAPSULE | Freq: Two times a day (BID) | ORAL | Status: DC
  Administered 2016-06-18: 500 mg via ORAL
  Filled 2016-06-18 (×5): qty 2

## 2016-06-18 MED ORDER — HALOPERIDOL LACTATE 5 MG/ML IJ SOLN
0.5000 mg | Freq: Four times a day (QID) | INTRAMUSCULAR | Status: DC | PRN
Start: 2016-06-18 — End: 2016-06-19
  Administered 2016-06-18 (×2): 0.5 mg via INTRAVENOUS
  Filled 2016-06-18 (×3): qty 1

## 2016-06-18 MED ORDER — GABAPENTIN 300 MG PO CAPS
300.0000 mg | ORAL_CAPSULE | Freq: Three times a day (TID) | ORAL | Status: DC
  Administered 2016-06-18 (×3): 300 mg via ORAL
  Filled 2016-06-18 (×3): qty 1

## 2016-06-18 MED ORDER — ALBUTEROL SULFATE (2.5 MG/3ML) 0.083% IN NEBU
INHALATION_SOLUTION | RESPIRATORY_TRACT | Status: AC
  Administered 2016-06-18: 17:00:00
  Filled 2016-06-18: qty 3

## 2016-06-18 MED ORDER — FAMOTIDINE 20 MG PO TABS
20.0000 mg | ORAL_TABLET | Freq: Two times a day (BID) | ORAL | Status: DC
  Administered 2016-06-18: 20 mg via ORAL
  Filled 2016-06-18: qty 1

## 2016-06-18 NOTE — Progress Notes (Signed)
PULMONARY / CRITICAL CARE MEDICINE   Name: Jeff Foster: [id number] DOB: [birth year]    ADMISSION DATE:  06/04/2016 CONSULTATION DATE:  06/10/2016  REFERRING MD:  Dr. Randol KernElgergawy  CHIEF COMPLAINT:  AMS  BRIEF SUMMARY: 55 y/o male with PMH of MR, ward of the state, admitted on 12/4 with confusion.  Later developed aspiration PNA and respiratory failure requiring intubation.     SUBJECTIVE:   Extubated 12/14 and in no resp distress      VITAL SIGNS: BP 118/62   Pulse 71   Temp (!) 100.5 F (38.1 C) (Oral)   Resp (!) 26   Ht 6\' 2"  (1.88 m)   Wt 221 lb 5.5 oz (100.4 kg)   SpO2 94%   BMI 28.42 kg/m   HEMODYNAMICS:    VENTILATOR SETTINGS: Vent Mode: PSV;CPAP FiO2 (%):  [40 %] 40 % Pressure Support:  [5 cmH20] 5 cmH20  INTAKE / OUTPUT: I/O last 3 completed shifts: In: 885.3 [I.V.:410.6; NG/GT:424.7; IV Piggyback:50] Out: 2875 [Urine:2875]  PHYSICAL EXAMINATION: General: adult male off vent restrained and agitated HENT: NCAT, PULM: non-labored, lungs bilaterally coarse  CV: RRR, no mgr GI: BS+, soft, nontender MSK: normal bulk and tone Neuro: Agitated and restrained   LABS:  BMET  Recent Labs Lab 06/16/16 0310 06/17/16 0316 06/18/16 0334  NA 144 148* 147*  K 3.1* 3.6 4.3  CL 106 110 112*  CO2 31 31 30   BUN 23* 27* 18  CREATININE 1.04 1.03 0.78  GLUCOSE 124* 107* 90    Electrolytes  Recent Labs Lab 06/15/16 0358 06/16/16 0310 06/17/16 0316 06/18/16 0334  CALCIUM 8.4* 8.4* 8.4* 8.5*  MG 1.7 1.8 2.1  --   PHOS 3.6 3.1 3.0  --     CBC  Recent Labs Lab 06/14/16 0352 06/15/16 0358 06/18/16 0334  WBC 9.5 7.1 7.0  HGB 8.8* 8.3* 7.5*  HCT 26.6* 23.9* 20.4*  PLT 80* 99* 182    Coag's No results for input(s): APTT, INR in the last 168 hours.  Sepsis Markers  Recent Labs Lab 06/12/16 0348 06/13/16 0328  PROCALCITON 0.36 0.29    ABG No results for input(s): PHART, PCO2ART, PO2ART in the last 168 hours.  Liver Enzymes No results  for input(s): AST, ALT, ALKPHOS, BILITOT, ALBUMIN in the last 168 hours.  Cardiac Enzymes No results for input(s): TROPONINI, PROBNP in the last 168 hours.  Glucose  Recent Labs Lab 06/17/16 0753 06/17/16 1233 06/17/16 1557 06/17/16 2053 06/18/16 0000 06/18/16 0349  GLUCAP 94 111* 94 90 84 89    Imaging Dg Abd Portable 1v  Result Date: 06/17/2016 CLINICAL DATA:  Nasogastric tube placement. EXAM: PORTABLE ABDOMEN - 1 VIEW COMPARISON:  Abdominal radiograph June 14, 2016 FINDINGS: Feeding tube tip projects in proximal stomach. Gas-filled mildly prominent small and large bowel, incompletely assessed. No intra- abdominal mass effect or pathologic calcifications. Soft tissue planes and included osseous structures are nonsuspicious. IMPRESSION: Feeding tube tip projects in proximal stomach. Electronically Signed   By: Awilda Metroourtnay  Bloomer M.D.   On: 06/17/2016 23:40     EVENTS 12/04 - admit 12/07 - Intubated for airway protection 12/07 - Sedated or agitated 12/09 - Remains on 70%, oliguric.  Full DNR w/o vasopressors or escalation established.  Continue current Rx 12/10 - agitated, on fent gtt + versed prn. Febrile 103F, vanc added 12/11 - ongoing fever, failed SBT.  Lasix > net neg 2.5L 12/13 - neg balance, weaning on 5/5 12/14 extubated one way 12/15 transfer to floor  and to [facility] service.   STUDIES:  MRI brain 12/4 > no evidence acute infarct or hemorrhage, moderate brain parenchymal volume loss (stable).  ANTIBIOTICS:  Zosyn 12/6 >> 12/12 Vanco 12/9 >> 12/11    CULTURES:  BCx2 12/5 >> negative  Sputum 12/8 >> negative  BCx2 12/9 >>   ASSESSMENT / PLAN:  PULMONARY A: Acute hypoxemic respiratory failure secondary to aspiration PNA Likely pulmonary edema P:   Extubated 12/14 with no re intubation O2 as needed  CARDIOVASCULAR A:  Borderline hypotension, not in shock - resolved  P:  Telemetry monitoring HD monitoring  RENAL A:   AKI -  resolved Hypomagnesemia  P:   NS @ KVO Trend BMP / UOP  Replace electrolytes as indicated   GASTROINTESTINAL A:   Dysphagia - chronic P:   Dc feeding tube Oral feeds and meds as tolerated  HEMATOLOGIC A:   Anemia - of chronic dz and critical illness Thrombocytopenia - stable P:  Follow CBC SCDs Assess HITT AB > mildly elevated, no SRA completed.  Hold further testing for now  INFECTIOUS A:   Aspiration PNA + ongoing fevers P:   Completed 7 d zosyn F/u cultures  ENDOCRINE A:   Hypothyroidism  P:   Follow glucose Continue synthroid  NEUROLOGIC A:   Intellectual disability Paranoid shizophrenia  P:   RASS goal: 0  Continue home gabapentin, ativan, thorazine as able Continue depakote po as tolerated   FAMILY : - Updates: DSS sent paperwork 06/11/16 - full DNR without escalation but continue current care (no cvl or vasopressors) towards medical extubation and improvement. If fails, then terminal wean.  DSS would want to be here if patient declines.   12/14 extubated and 12/15 NAD but uncooperative and feeding tube is to be removed. Attempt oral feeds, IV sedation as need, attempt po meds as tolerated. Transfer to floor and to [facility] service as of 12/16 Palliative care is involved and concurs with care plan.  Brett CanalesSteve Minor ACNP [facility] PCCM Pager [phone] till 3 pm If no answer page [phone] 06/18/2016, 9:52 AM

## 2016-06-18 NOTE — Plan of Care (Signed)
Problem: Respiratory: Goal: Ability to maintain a clear airway and adequate ventilation will improve Outcome: Completed/Met Date Met: 06/18/16 extubated on 12/14

## 2016-06-18 NOTE — Evaluation (Signed)
Physical Therapy Evaluation Patient Details Name: Genene ChurnJohn Deacon MRN: [id number] DOB: 1961-01-12 Today's Date: 06/18/2016   History of Present Illness  55 yo male admitted with acute encephalopathy. Intubated 12/7, Extubated 12/14. Hx of mental retardation with intermittent explosive disorder, paranoid schizonphrenia, Sz, dysphagia, CVA. Pt is from a group home  Clinical Impression  On eval, pt was Total assist +2 for bed mobility. Pt did not follow commands. Will follow on a trial basis to see if patient's ability to participate improves. Recommend SNF.    Follow Up Recommendations SNF    Equipment Recommendations  None recommended by PT    Recommendations for Other Services       Precautions / Restrictions Precautions Precautions: Fall      Mobility  Bed Mobility Overal bed mobility: Needs Assistance Bed Mobility: Rolling;Supine to Sit;Sit to Sidelying Rolling: Total assist;+2 for physical assistance;+2 for safety/equipment   Supine to sit: Total assist;+2 for physical assistance;+2 for safety/equipment   Sit to sidelying: Total assist;+2 for physical assistance;+2 for safety/equipment General bed mobility comments: Pt did not participate at all. Total assist for all mobility.   Transfers                    Ambulation/Gait                Stairs            Wheelchair Mobility    Modified Rankin (Stroke Patients Only)       Balance Overall balance assessment: Needs assistance   Sitting balance-Leahy Scale: Zero Sitting balance - Comments: Sat pt up at EOB ~2-3 minutes. No balance reactions noted. Multidirectional LOB.                                      Pertinent Vitals/Pain Pain Assessment: Faces Faces Pain Scale: No hurt    Home Living Family/patient expects to be discharged to:: Unsure Living Arrangements: Group Home                    Prior Function           Comments: Pt unable to provide PLOF, no  caregivers able to provide.      Hand Dominance        Extremity/Trunk Assessment   Upper Extremity Assessment Upper Extremity Assessment: Difficult to assess due to impaired cognition (pt not following commands)    Lower Extremity Assessment Lower Extremity Assessment: Difficult to assess due to impaired cognition (pt not following commands)    Cervical / Trunk Assessment Cervical / Trunk Assessment: Kyphotic  Communication   Communication: Expressive difficulties  Cognition Arousal/Alertness: Lethargic   Overall Cognitive Status: History of cognitive impairments - at baseline                      General Comments      Exercises     Assessment/Plan    PT Assessment Patient needs continued PT services  PT Problem List Decreased strength;Decreased mobility          PT Treatment Interventions Therapeutic activities;Therapeutic exercise;Patient/family education;Functional mobility training;Balance training;Gait training    PT Goals (Current goals can be found in the Care Plan section)  Acute Rehab PT Goals Patient Stated Goal: none stated PT Goal Formulation: Patient unable to participate in goal setting Time For Goal Achievement: 07/02/16 Potential to Achieve Goals: Poor  Frequency Min 2X/week (2x/week trial only to see if pt becomes more participatory)   Barriers to discharge Decreased caregiver support      Co-evaluation               End of Session   Activity Tolerance: Patient tolerated treatment well Patient left: with call bell/phone within reach;with bed alarm set           Time: 1141-1152 PT Time Calculation (min) (ACUTE ONLY): 11 min   Charges:   PT Evaluation $PT Eval Low Complexity: 1 Procedure     PT G Codes:        Rebeca AlertJannie Verdis Bassette, MPT Pager: [phone]

## 2016-06-18 NOTE — Progress Notes (Signed)
CSW continues to follow this pt from group home with DSS guardian. PN reviewed. Pt has been extubated. Pt remains in wrist restraints with episodes of agitation. NG tube placed last night. Palliative Care Team is following. Disposition to be determined. CSW will continue to follow to assist with d/c planning.  Cori RazorJamie Leny Morozov LCSW [phone]

## 2016-06-18 NOTE — Progress Notes (Signed)
Patient turned and repositioned for comfort. Bilateral wrist restraints checked, no  Signs of injuries, ROM performed. Will continue to monitor.

## 2016-06-18 NOTE — Progress Notes (Signed)
Patient from ICU. Alert. Has bilateral soft wrist restraint. Oxygen therapy ongoing. Placed comfortably in bed. Will continue to monitor.

## 2016-06-18 NOTE — Progress Notes (Signed)
Nutrition Follow-up  DOCUMENTATION CODES:   Not applicable  INTERVENTION:  - Will order Magic Cup TID per discussion with Palliative NP, each supplement provides 290 kcal and 9 grams of protein - Will monitor for diet tolerance/advancement versus continuation of comfort feeds only.  - RD will follow-up 12/18.  NUTRITION DIAGNOSIS:   Inadequate oral intake related to inability to eat as evidenced by NPO status. -ongoing  GOAL:   Patient will meet greater than or equal to 90% of their needs -unmet  MONITOR:   Diet advancement, Weight trends, Labs, I & O's  ASSESSMENT:   55 year old male with PMH as below, which is significant for MR with intermittent explosive disorder, paranoid schizophrenia, seizures, dysphagia with aspiration, and CVA. He has a legal guardian on file, but staff has been unable to get a hold of them. [facility] after presenting with altered mental status, drowsiness, and weakness. Workup in the ED was largely non-diagnostic. CXR and urine without obvious infectious source, MRI brain without obvious encephalopathic etiology. He was admitted by the hospitalist team. Hospital course has been complicated by intermittent agitation (severely aggressive with staff) and he has since developed what is suspected to be aspiration pneumonia on CXR and started on Zosyn. 12/7 his mental status declined and he became minimally responsive. Unable to protect airway. PCCM asked to see for possible intubation.  12/15 Pt extubated yesterday shortly after 1200. NGT placed overnight for medication administration. Spoke with Maralyn SagoSarah, NP with Palliative team, earlier this AM who reports she wrote order for NGT removal as it does not align with GOC. She states plan for comfort feeds and to determine if pt is able to tolerant/interested in accepting POs and/or pills. Plan to trial soft foods from the floor. Talked with her about Magic Cup order and she is in agreement with this;  will communicate with RN. Weight now back to admission weight.   Medications reviewed; PRN Dulcolax, 20 mg IV Pepcid BID, PRN Zofran, 5 mL Senokot BID.  Labs reviewed; Na: 147 mmol/L, Cl: 112 mmol/L, Ca: 8.5 mg/dL.   12/14 - Pt continues with OGT and is currently receiving TF at ordered rate of Osmolite 1.5 @ 20 mL/hr which is providing 720 kcal, 30 grams of protein, and 366 mL free water.  - Estimated nutrition needs updated this AM based on admission weight of 100.2 kg (weight currently +1.3 kg since that time).  - Needs not based on ventilator as plan for one-way extubation today. - PCCM notes indicate evidence that pt is able to breath on his own.  - Palliative notes reviewed.    Patient is currently intubated on ventilator support MV: 9.7 L/min Temp (24hrs), Avg:99.8 F (37.7 C), Min:99.2 F (37.3 C), Max:100.5 F (38.1 C) BP: 116/58 and MAP: 76.  Drip: Fentanyl @ 100 mcg/hr.     12/13 - Pt continues with OGT and TF currently on hold for pending one-way extubation per discussion with RN and review of PCCM NP note.  - Pt previously receiving trickle TF: Osmolite 1.5 @ 20 mL/hr.  - Pt had a BM this AM which is first documented since admission (9 days). - Weight -0.4 kg from yesterday but is +3.1 kg from admission weight (100.2 kg) which is being used to estimate nutrition needs.   Patient is currently intubated on ventilator support MV: 9.1L/min Temp (24hrs), Avg:100.4 F (38 C), Min:99.8 F (37.7 C), Max:101.1 F (38.4 C) Drips: RN reports all IV sedation off  at this time pending extubation and that pt has been breathing on his own this AM.     Diet Order:   NPO  Skin:  Reviewed, no issues  Last BM:  12/13  Height:   Ht Readings from Last 1 Encounters:  06/10/16 6\' 2"  (1.88 m)    Weight:   Wt Readings from Last 1 Encounters:  06/18/16 221 lb 5.5 oz (100.4 kg)    Ideal Body Weight:  86.36 kg  BMI:  Body mass index is 28.42 kg/m.  Estimated  Nutritional Needs:   Kcal:  2004-2204 (20-22 kcal/kg)  Protein:  120-130 grams (1.4-1.5 grams/kg IBW)  Fluid:  2 L/day  EDUCATION NEEDS:   No education needs identified at this time    Trenton GammonJessica Lynell Greenhouse, MS, RD, LDN, CNSC Inpatient Clinical Dietitian Pager # [phone] After hours/weekend pager # [phone]

## 2016-06-18 NOTE — Progress Notes (Signed)
PHARMACIST - PHYSICIAN COMMUNICATION  DR:   PCCM  CONCERNING: IV to Oral Route Change Policy  RECOMMENDATION: This patient is receiving famotidine by the intravenous route.  Based on criteria approved by the Pharmacy and Therapeutics Committee, the intravenous medication(s) is/are being converted to the equivalent oral dose form(s).   DESCRIPTION: These criteria include:  The patient is eating (either orally or via tube) and/or has been taking other orally administered medications for a least 24 hours  The patient has no evidence of active gastrointestinal bleeding or impaired GI absorption (gastrectomy, short bowel, patient on TNA or NPO).  If you have questions about this conversion, please contact the Pharmacy Department  []   [phone]( [phone] )  [facility] []   [phone]( [phone] )  [facility] []   [phone]( [phone] )  [facility] []   [phone]( [phone] )  [facility] [x]   [phone]( [phone] )  [facility]   Juliette AlcideZeigler, Dustin [place] Beth[place]hemGeorge, [facility] CenterRPH 06/18/2016 11:22 AM

## 2016-06-18 NOTE — Progress Notes (Signed)
Pt agitated at this time due to panda placement.  RT will hold Neb tx and CPT per RN.  RT to monitor and assess as needed.

## 2016-06-18 NOTE — Progress Notes (Signed)
Patient extremely agitated and combative.  Several PO medications due at 2200 but patient NPO.  Dr. Arsenio LoaderSommer called and orders for NG tube and bilateral wrist restraints received.  NG placed and verified by xray.  Per Dr. Darrick Pennaeterding, okay to use for medications.

## 2016-06-18 NOTE — Progress Notes (Signed)
Palliative notified earlier this afternoon re: patient's agitation and restlessness.SaraH D. NP ordered Haldol 0.5 mg given at 1625. Also called RT at around 1635 to check patient's respiratory status, oxygen dropped to 84%, RR 24. RT administered neb treatment and also changed nasal cannula to salter Lowgap at 6L, oxygen went up to 92% after oxygen therapy. Will continue to monitor.

## 2016-06-18 NOTE — Progress Notes (Signed)
CPT held at this time per RN request.

## 2016-06-18 NOTE — Progress Notes (Addendum)
Daily Progress Note   Patient Name: Jeff Foster       Date: 06/18/2016 DOB: [birth year]  Age: 55 y.o. MRN#: [id number] Attending Physician: Brand Males, MD Primary Care Physician: Vonna Drafts, FNP Admit Date: 06/17/2016  Reason for Consultation/Follow-up: Establishing goals of care  Subjective: Jeff Foster was extubated on 12/14 and has been tolerating being on nasal cannula. Unfortunately, he remains predominantly  unresponsive and increasingly agitated. He will open his eyes to his name, but is otherwise restless in bed. He did require replacement of his NG tube overnight to enable medication administration.   Length of Stay: 10  Current Medications: Scheduled Meds:  . aspirin  81 mg Oral Daily  . chlorhexidine gluconate (MEDLINE KIT)  15 mL Mouth Rinse BID  . chlorproMAZINE  50 mg Oral TID  . famotidine (PEPCID) IV  20 mg Intravenous Q12H  . gabapentin  300 mg Per Tube Q8H  . ipratropium-albuterol  3 mL Nebulization Q4H  . levothyroxine  25 mcg Oral QAC breakfast  . LORazepam  1 mg Oral BID  . mouth rinse  15 mL Mouth Rinse QID  . mirtazapine  15 mg Oral QHS  . oxcarbazepine  600 mg Oral BID  . QUEtiapine  200 mg Oral QHS  . sennosides  5 mL Oral BID  . sertraline  100 mg Oral Daily  . trazodone  300 mg Oral QHS  . Valproate Sodium  500 mg Per Tube BID    Continuous Infusions: . sodium chloride 10 mL/hr at 06/17/16 1800    PRN Meds: acetaminophen, bisacodyl, glycopyrrolate, LORazepam, ondansetron **OR** ondansetron (ZOFRAN) IV  Physical Exam  Constitutional: He has a sickly appearance. Nasal cannula in place.  HENT:  Head: Normocephalic and atraumatic.  Cardiovascular: Normal rate and regular rhythm.   Pulmonary/Chest:  On [place] with stable O2 saturation. No  audible secretions. Lungs diminished in bases.  Abdominal: Soft. Normal appearance.  Musculoskeletal:  Spontaneous movement of extremities. Hands in mitts and wrist restraints in place.   Neurological:  Opens eyes to voice and name, otherwise unresponsive.  Skin: Skin is warm and dry. There is pallor.  Psychiatric:  He is more restless/agitated today         Vital Signs: BP 118/62   Pulse 71   Temp (!) 101.3 F (38.5 C) (Axillary)  Comment: PRN tylenol given   Resp (!) 26   Ht 6' 2"  (1.88 m)   Wt 100.4 kg (221 lb 5.5 oz)   SpO2 94%   BMI 28.42 kg/m  SpO2: SpO2: 94 % O2 Device: O2 Device: Nasal Cannula O2 Flow Rate: O2 Flow Rate (L/min): 5 L/min  Intake/output summary:   Intake/Output Summary (Last 24 hours) at 06/18/16 0840 Last data filed at 06/18/16 0600  Gross per 24 hour  Intake              360 ml  Output             2010 ml  Net            -1650 ml   LBM: Last BM Date: 06/16/16 Baseline Weight: Weight: 91.6 kg (202 lb) Most recent weight: Weight: 100.4 kg (221 lb 5.5 oz)  Flowsheet Rows   Flowsheet Row Most Recent Value  Intake Tab  Referral Department  Hospitalist  Unit at Time of Referral  ICU  Palliative Care Primary Diagnosis  Neurology  Date Notified  06/09/16  Palliative Care Type  Return patient Palliative Care  Reason for referral  Clarify Goals of Care  Date of Admission  06/12/2016  # of days IP prior to Palliative referral  2  Clinical Assessment  Psychosocial & Spiritual Assessment  Palliative Care Outcomes      Patient Active Problem List   Diagnosis Date Noted  . Acute on chronic respiratory failure ([place])   . Acute kidney injury ([place]) 06/12/2016  . HCAP (healthcare-associated pneumonia)   . Goals of care, counseling/discussion   . Encounter for intubation   . Hyponatremia 06/08/2016  . Delirium 06/21/2016  . Anxiety, generalized 03/13/2016  . Intermittent explosive disorder 03/13/2016  . Urinary retention 03/10/2016  . Aggressive  behavior 03/06/2016  . Essential hypertension   . Post-ictal confusion 10/27/2015  . Hypothyroidism 10/27/2015  . Thrombocytopenia ([place]) 10/27/2015  . Acute respiratory failure with hypoxemia ([place]) 10/27/2015  . Seizure ([place]) 10/27/2015  . Psychosis 10/27/2015  . Normocytic anemia 10/27/2015  . DNR (do not resuscitate) 07/25/2015  . Aspiration pneumonia ([place]) 07/25/2015  . Palliative care encounter 07/25/2015  . Dysphagia   . Altered mental status   . UTI (lower urinary tract infection) 07/18/2015  . Cognitive decline 07/18/2015  . Acute encephalopathy 07/18/2015  . Mental retardation 07/18/2015  . Hypertension 07/18/2015  . History of CVA (cerebrovascular accident) 07/18/2015  . Encephalopathy acute 07/18/2015  . Dehydration     Palliative Care Assessment & Plan   Patient Profile:  55 y.o. male  with past medical history of MR with intermittent explosive disorder, paranoid schizophrenia, seizures, dysphagia with chronic aspiration, and CVA who was admitted on 06/15/2016 after his group home noted he had new tremors, a fall, and altered mental status. Initial work-up was largely non-diagnostic with CXR and urine without obvious infectious source, MRI and CT brain without obvious encephalopathic etiology. He did become increasingly aggressive, which shifted into somnolence and becoming unresponsive after he developed what appears to be aspiration pneumonia. Despite abx, he continues to deteriorate and required intubation on 12/7.   Recommendations/Plan: Goals of care:  Dr. Domingo Cocking had a long discussion with pt's legal guardian, Jeff Foster, on 12/11. During this discussion the plan established was to continue current level of care with no escalation (no pressors, invasive procedures, central line, DNR). Primary team is going to continue to diurese and continue to attempt vent weaning. If he does not improve  despite diuresis and is unable to be vent weaned, the plan is to one-way extubate with  full comfort care.   On 12/14 he was extubated, and has been stable on nasal cannula. He remains predominantly unresponsive, and had an NG tube placed overnight for medication administration. Unfortunately, I do no believe there are interventions that would improve his mentation or optimize his capacity for improvement. At this juncture I'd return to the original stated goals for this man: promoting quality of life. The NG tube is not a viable long term option, and a feeding tube doesn't align with the goals of care. Consequently, I would recommend doing a trial of assessing ability for oral intake of food, fluids, and medications. If oral intake is tolerated, the plan would be for him to return to his group home once cleared for discharge. If oral intake is not tolerated--if he aspirates, refuses or spits out pills, etc--his care should then be centered on comfort only and Hospice could be considered. I discussed this with his legal guardian, and she was in agreement.    Symptoms Constipation (BM 12/13): Continue senna liquid BID, Dulcolax PR PRN,  Secretions (improved overall): Continue robinul PRN Agitation: Continue home psych medications, added PRN IV ativan.   *medications adjusted for oral administration, with disintegrating tablets when available.   Goals of Care and Additional Recommendations:  Limitations on Scope of Treatment: Continue current therapies, no escalation of care in event of decompensation  Code Status:    Code Status Orders        Start     Ordered   06/11/16 1702  Do not attempt resuscitation (DNR)  Continuous    Question Answer Comment  In the event of cardiac or respiratory ARREST Do not call a "code blue"   In the event of cardiac or respiratory ARREST Do not perform Intubation, CPR, defibrillation or ACLS   In the event of cardiac or respiratory ARREST Use medication by any route, position, wound care, and other measures to relive pain and suffering. May use  oxygen, suction and manual treatment of airway obstruction as needed for comfort.      06/11/16 1701    Code Status History    Date Active Date Inactive Code Status Order ID Comments User Context   06/09/2016 11:27 AM 06/11/2016  5:01 PM Full Code [id number]  Albertine Patricia, MD Inpatient   07/04/2016  8:36 PM 06/09/2016 11:27 AM DNR [id number]  Norval Morton, MD ED   03/12/2016 10:34 PM 03/17/2016  6:26 PM Full Code [id number]  Orlie Dakin, MD ED   03/10/2016  5:23 PM 03/12/2016  9:49 PM Full Code [id number]  Caren Griffins, MD ED   03/10/2016  5:07 PM 03/10/2016  5:23 PM DNR [id number]  Mercy Riding, MD ED   03/04/2016  6:37 PM 03/06/2016  5:16 PM Full Code [id number]  Monico Blitz, PA-C ED   03/04/2016  4:18 PM 03/04/2016  6:37 PM Full Code [id number]  Monico Blitz, PA-C ED   10/27/2015 10:03 PM 10/28/2015  9:06 PM DNR [id number]  Vianne Bulls, MD ED   07/25/2015 11:18 AM 08/07/2015  8:00 PM DNR [id number]  Melton Alar, PA-C Inpatient   07/18/2015  8:49 PM 07/25/2015 11:18 AM Full Code [id number]  Willia Craze, NP Inpatient       Prognosis:   Unable to determine  Discharge Planning:  To Be Determined  Care plan was discussed with his legal guardian, care nurse, and primary  attending.  Thank you for allowing the Palliative Medicine Team to assist in the care of this patient.   Time In: 0800 Time Out: 0900 Total Time 60 minutes Prolonged Time Billed yes      Greater than 50%  of this time was spent counseling and coordinating care related to the above assessment and plan.  Jannette Fogo, NP  Please contact Palliative Medicine Team phone at [phone] for questions and concerns.

## 2016-06-19 DIAGNOSIS — A419 Sepsis, unspecified organism: Secondary | ICD-10-CM

## 2016-06-19 LAB — GLUCOSE, CAPILLARY
GLUCOSE-CAPILLARY: 93 mg/dL (ref 65–99)
GLUCOSE-CAPILLARY: 120 mg/dL — AB (ref 65–99)
Glucose-Capillary: 90 mg/dL (ref 65–99)
Glucose-Capillary: 96 mg/dL (ref 65–99)

## 2016-06-19 MED ORDER — LIP MEDEX EX OINT
TOPICAL_OINTMENT | CUTANEOUS | Status: AC
  Administered 2016-06-19: 1
  Filled 2016-06-19: qty 7

## 2016-06-19 MED ORDER — MORPHINE SULFATE (PF) 4 MG/ML IV SOLN
4.0000 mg | Freq: Once | INTRAVENOUS | Status: AC
  Administered 2016-06-19: 4 mg via INTRAVENOUS
  Filled 2016-06-19: qty 1

## 2016-06-19 MED ORDER — LORAZEPAM 2 MG/ML IJ SOLN
2.0000 mg | INTRAMUSCULAR | Status: DC | PRN
  Administered 2016-06-19 – 2016-06-20 (×3): 2 mg via INTRAVENOUS
  Filled 2016-06-19 (×3): qty 1

## 2016-06-19 MED ORDER — HALOPERIDOL LACTATE 5 MG/ML IJ SOLN
5.0000 mg | Freq: Once | INTRAMUSCULAR | Status: AC
  Administered 2016-06-19: 5 mg via INTRAVENOUS
  Filled 2016-06-19: qty 1

## 2016-06-19 MED ORDER — HALOPERIDOL LACTATE 5 MG/ML IJ SOLN
5.0000 mg | Freq: Four times a day (QID) | INTRAMUSCULAR | Status: DC | PRN
  Administered 2016-06-20: 5 mg via INTRAVENOUS
  Filled 2016-06-19: qty 1

## 2016-06-19 MED ORDER — MORPHINE SULFATE (PF) 2 MG/ML IV SOLN
2.0000 mg | INTRAVENOUS | Status: DC | PRN
  Administered 2016-06-19 – 2016-06-20 (×4): 2 mg via INTRAVENOUS
  Filled 2016-06-19 (×4): qty 1

## 2016-06-19 NOTE — Progress Notes (Signed)
Patient appears more comfortable after administration of ordered medications.  Pt resting, with no attempts to hit staff and no facial grimace.  Respirations slower and more even and does not appear to be experiencing air hunger at this time.  RN removed two point restraints and notified MD. Will continue to monitor.

## 2016-06-19 NOTE — Progress Notes (Signed)
Daily Progress Note   Patient Name: Jeff Foster       Date: 06/19/2016 DOB: 04/09/1961  Age: 55 y.o. MRN#: [id number] Attending Physician: Jeff Molder, MD Primary Care Physician: Jeff Drafts, FNP Admit Date: 06/10/2016  Reason for Consultation/Follow-up: Establishing goals of care  Subjective: Continues to be restless and agitated, not awake alert, not stable enough to be considered for swallowing evaluation, unsafe for POs at this time. See below Length of Stay: 11  Current Medications: Scheduled Meds:  . aspirin  81 mg Oral Daily  . chlorhexidine gluconate (MEDLINE KIT)  15 mL Mouth Rinse BID  . chlorproMAZINE  50 mg Oral TID  . famotidine  20 mg Oral BID  . gabapentin  300 mg Oral TID  . haloperidol lactate  5 mg Intravenous Once  . ipratropium-albuterol  3 mL Nebulization Q6H  . levothyroxine  25 mcg Oral QAC breakfast  . LORazepam  1 mg Oral BID  . mouth rinse  15 mL Mouth Rinse QID  . mirtazapine  15 mg Oral QHS  .  morphine injection  4 mg Intravenous Once  . oxcarbazepine  600 mg Oral BID  . QUEtiapine  200 mg Oral QHS  . sennosides  5 mL Oral BID  . sertraline  100 mg Oral Daily  . trazodone  300 mg Oral QHS  . valproic acid  500 mg Oral BID    Continuous Infusions: . sodium chloride 10 mL/hr at 06/17/16 1800    PRN Meds: acetaminophen, albuterol, bisacodyl, glycopyrrolate, haloperidol lactate, LORazepam, morphine injection, ondansetron **OR** ondansetron (ZOFRAN) IV  Physical Exam  Constitutional: He has a sickly appearance. Nasal cannula in place.  HENT:  Head: Normocephalic and atraumatic.  Cardiovascular: Normal rate and regular rhythm.   Pulmonary/Chest:  On high flow O2 [place], escalating O2 requirements.   Abdominal: Soft. Normal appearance.    Musculoskeletal:   Does not follow commands. Hands in mitts and wrist restraints in place.   Neurological:    unresponsive.  Skin: Skin is warm and dry. There is pallor.  Psychiatric:  He is more restless/agitated today         Vital Signs: BP 107/65 (BP Location: Right Arm)   Pulse 82   Temp (!) 102.2 F (39 C) (Axillary)   Resp 20   Ht 6' 2" (  1.88 m)   Wt 97.5 kg (215 lb)   SpO2 (!) 85%   BMI 27.60 kg/m  SpO2: SpO2: (!) 85 % O2 Device: O2 Device: High Flow Nasal Cannula ([place]) O2 Flow Rate: O2 Flow Rate (L/min): 10 L/min (increased to 12 lpm and left note for current Sp02 goal- rn )  Intake/output summary:   Intake/Output Summary (Last 24 hours) at 06/19/16 0865 Last data filed at 06/18/16 1831  Gross per 24 hour  Intake               20 ml  Output              650 ml  Net             -630 ml   LBM: Last BM Date: 06/16/16 Baseline Weight: Weight: 91.6 kg (202 lb) Most recent weight: Weight: 97.5 kg (215 lb)  Flowsheet Rows   Flowsheet Row Most Recent Value  Intake Tab  Referral Department  Hospitalist  Unit at Time of Referral  ICU  Palliative Care Primary Diagnosis  Neurology  Date Notified  06/09/16  Palliative Care Type  Return patient Palliative Care  Reason for referral  Clarify Goals of Care  Date of Admission  07/03/2016  # of days IP prior to Palliative referral  2  Clinical Assessment  Psychosocial & Spiritual Assessment  Palliative Care Outcomes      Patient Active Problem List   Diagnosis Date Noted  . Encounter for care related to feeding tube   . Acute on chronic respiratory failure ([place])   . Acute kidney injury ([place]) 06/12/2016  . HCAP (healthcare-associated pneumonia)   . Goals of care, counseling/discussion   . Encounter for intubation   . Hyponatremia 06/08/2016  . Delirium 06/06/2016  . Anxiety, generalized 03/13/2016  . Intermittent explosive disorder 03/13/2016  . Urinary retention 03/10/2016  . Aggressive behavior 03/06/2016   . Essential hypertension   . Post-ictal confusion 10/27/2015  . Hypothyroidism 10/27/2015  . Thrombocytopenia ([place]) 10/27/2015  . Acute respiratory failure with hypoxemia ([place]) 10/27/2015  . Seizure ([place]) 10/27/2015  . Psychosis 10/27/2015  . Normocytic anemia 10/27/2015  . DNR (do not resuscitate) 07/25/2015  . Aspiration pneumonia ([place]) 07/25/2015  . Palliative care encounter 07/25/2015  . Dysphagia   . Altered mental status   . UTI (lower urinary tract infection) 07/18/2015  . Cognitive decline 07/18/2015  . Acute encephalopathy 07/18/2015  . Mental retardation 07/18/2015  . Hypertension 07/18/2015  . History of CVA (cerebrovascular accident) 07/18/2015  . Encephalopathy acute 07/18/2015  . Dehydration     Palliative Care Assessment & Plan   Patient Profile:  55 y.o. male  with past medical history of MR with intermittent explosive disorder, paranoid schizophrenia, seizures, dysphagia with chronic aspiration, and CVA who was admitted on 06/09/2016 after his group home noted he had new tremors, a fall, and altered mental status. Initial work-up was largely non-diagnostic with CXR and urine without obvious infectious source, MRI and CT brain without obvious encephalopathic etiology. He did become increasingly aggressive, which shifted into somnolence and becoming unresponsive after he developed what appears to be aspiration pneumonia. Despite abx, he continues to deteriorate and required intubation on 12/7.   Recommendations/Plan: Goals of care:  Jeff Foster. Domingo Foster had a long discussion with pt's legal guardian, Jeff Foster, on 12/11. During this discussion the plan established was to continue current level of care with no escalation (no pressors, invasive procedures, central line, DNR). Primary team CCM attempted weaning and  patient was extubated one way on 12-14.    Patient remains predominantly restless and agitated, he has high O2 requirements, unsafe for PO trials.   As per palliative  medicine team's discussions with legal guardian, there was to be a time trial, and if patient has ongoing decline, plan was to shift focus to full scope of comfort measures.   This morning, it appears that patient will most benefit from comfort care, discussed with bedside RN and Jeff Foster Jeff Foster, agree with current opioid and benzo regimen. Continue Haldol, call placed to discuss with Jeff Foster, legal guardian, but unable to reach her.     Goals of Care and Additional Recommendations:  Limitations on Scope of Treatment: Continue current therapies, no escalation of care in event of decompensation  Code Status:    Code Status Orders        Start     Ordered   06/11/16 1702  Do not attempt resuscitation (DNR)  Continuous    Question Answer Comment  In the event of cardiac or respiratory ARREST Do not call a "code blue"   In the event of cardiac or respiratory ARREST Do not perform Intubation, CPR, defibrillation or ACLS   In the event of cardiac or respiratory ARREST Use medication by any route, position, wound care, and other measures to relive pain and suffering. May use oxygen, suction and manual treatment of airway obstruction as needed for comfort.      06/11/16 1701    Code Status History    Date Active Date Inactive Code Status Order ID Comments User Context   06/09/2016 11:27 AM 06/11/2016  5:01 PM Full Code [id number]  Albertine Patricia, MD Inpatient   06/06/2016  8:36 PM 06/09/2016 11:27 AM DNR [id number]  Norval Morton, MD ED   03/12/2016 10:34 PM 03/17/2016  6:26 PM Full Code [id number]  Orlie Dakin, MD ED   03/10/2016  5:23 PM 03/12/2016  9:49 PM Full Code [id number]  Caren Griffins, MD ED   03/10/2016  5:07 PM 03/10/2016  5:23 PM DNR [id number]  Mercy Riding, MD ED   03/04/2016  6:37 PM 03/06/2016  5:16 PM Full Code [id number]  Monico Blitz, PA-C ED   03/04/2016  4:18 PM 03/04/2016  6:37 PM Full Code [id number]  Monico Blitz, PA-C ED   10/27/2015 10:03 PM 10/28/2015  9:06 PM DNR [id number]   Vianne Bulls, MD ED   07/25/2015 11:18 AM 08/07/2015  8:00 PM DNR [id number]  Melton Alar, PA-C Inpatient   07/18/2015  8:49 PM 07/25/2015 11:18 AM Full Code [id number]  Willia Craze, NP Inpatient       Prognosis:   hours to days?  Discharge Planning:  ?anticipated hospital death  Care plan was discussed with   care nurse, and primary attending.  Thank you for allowing the Palliative Medicine Team to assist in the care of this patient.   Time In: 8 Time Out: 8.25 Total Time 25 Prolonged Time Billed no      Greater than 50%  of this time was spent counseling and coordinating care related to the above assessment and plan.  Loistine Chance, MD [phone]  Please contact Palliative Medicine Team phone at [phone] for questions and concerns.

## 2016-06-19 NOTE — Progress Notes (Signed)
PROGRESS NOTE                                                                                                                                                                                                             Patient Demographics:    Delvecchio Madole, is a 55 y.o. male, DOB - 1960-11-01, MSX:[id number]  Admit date - 06/08/2016   Admitting Physician Norval Morton, MD  Outpatient Primary MD for the patient is Vonna Drafts, FNP  LOS - 24    Chief Complaint  Patient presents with  . Fall  . Tremors       Brief Narrative  55 year old male with history of mental retardation, ward of the state, intermittent explosive behavior, psychosis, seizure disorder, CVA, dysphagia, hypothyroidism, anemia and thrombocytopenia who presented with falls and tremors. Patient admitted to hospitalist service, on day 4 of hospitalization developed acute respiratory failure with hypoxia requiring intubation and admitted to ICU. On 12/9 patient started developing oliguric renal failure. Given his poor overall prognosis was made full DO NOT RESUSCITATE. Hospital course prolonged due to ongoing agitation requiring pain medications, anxiolytics. Also having ongoing sepsis. Patient extubated on 12/14 and transferred to hospitalist service. Palliative care consulted and made comfort with aim see for any improvement and able to take by mouth.      Subjective:   Patient remained restless overnight and given low-dose Ativan and Haldol along with IV morphine. Has increased work of breathing and requiring high amount of oxygen (up to 12 L via nasal cannula)   Assessment  & Plan :    Principal Problem:   Encephalopathy acute Worsening underlying medullary retardation complicated by hypoxemic respiratory failure secondary to aspiration pneumonia. Ongoing sepsis. Continues to be agitated and poorly responsive. Requiring when necessary Haldol  and morphine for pain and comfort.  Active Problems: Aspiration pneumonia ongoing sepsis. Completed 7 days of Zosyn. Cultures negative.  Dysphagia Discontinue feeding tube. Unable to take by mouth meds.  Acute hypoxemic respiratory failure Continue scheduled nebs. Requiring high amount of oxygen via nasal cannula (after 12 L). We will add Lasix as needed.  Mental retardation with aggressive behavior and restlessness Added morphine 2 mg every 3 hours as needed for pain and dyspnea. Increase Haldol to 5 mg every 6 hours as needed for agitation and added Ativan 2 mg every 4  hours as needed. Patient much calm and relaxed after medications adjusted. Off restraints. Continue to monitor.  Seizure disorder Continue Trileptal and valproic acid if tolerated.   Overall prognosis remains guarded. Palliative care discussed with patient legal guardian on 12/11 with plan to continue current level of care without escalation of care further (including avoidance of pressors, invasive procedures, central line and made DO NOT RESUSCITATE). Patient has not shown much clinical improvement, remains agitated and restless with high oxygen requirement. Unable to give by mouth medications or have any by mouth intake. Palliative care to approach his legal large and again with regards to making him comfort care.       Code Status : DO NOT RESUSCITATE  Family Communication  : None at bedside  Disposition Plan  : Needs hospice. In-hospital death also possible  Barriers For Discharge : Active symptoms  Consults  :   PC CM Palliative care  Procedures  :  Intubation MRI brain CT head   DVT Prophylaxis  :  Lovenox  Lab Results  Component Value Date   PLT 182 06/18/2016    Antibiotics  :    Anti-infectives    Start     Dose/Rate Route Frequency Ordered Stop   06/12/16 2245  vancomycin (VANCOCIN) IVPB 1000 mg/200 mL premix  Status:  Discontinued     1,000 mg 200 mL/hr over 60 Minutes  Intravenous Every 8 hours 06/12/16 2243 06/14/16 0901   06/09/16 2200  piperacillin-tazobactam (ZOSYN) IVPB 3.375 g     3.375 g 12.5 mL/hr over 240 Minutes Intravenous Every 8 hours 06/09/16 1515 06/15/16 2359   06/09/16 1600  piperacillin-tazobactam (ZOSYN) IVPB 3.375 g     3.375 g 100 mL/hr over 30 Minutes Intravenous  Once 06/09/16 1515 06/09/16 1629   06/09/16 1500  fluconazole (DIFLUCAN) IVPB 200 mg  Status:  Discontinued     200 mg 100 mL/hr over 60 Minutes Intravenous Every 24 hours 06/09/16 1429 06/14/16 0901        Objective:   Vitals:   06/19/16 0249 06/19/16 0500 06/19/16 0640 06/19/16 0840  BP: (!) 121/53  107/65   Pulse: 82     Resp: (!) 24  20   Temp: (!) 101.1 F (38.4 C)  (!) 102.2 F (39 C)   TempSrc: Axillary  Axillary   SpO2: 92%  91% (!) 85%  Weight:  97.5 kg (215 lb)    Height:        Wt Readings from Last 3 Encounters:  06/19/16 97.5 kg (215 lb)  03/12/16 97 kg (213 lb 13.5 oz)  03/04/16 99.3 kg (219 lb)     Intake/Output Summary (Last 24 hours) at 06/19/16 1304 Last data filed at 06/18/16 1831  Gross per 24 hour  Intake               20 ml  Output              650 ml  Net             -630 ml     Physical Exam  Gen: An agitated, poorly arousable, tachypneic HEENT: Moist mucosa Chest: Tachypnea, clear breath sounds bilaterally, no added sounds CVS: S1 and S2 tachycardic, no murmurs GI: soft, NT, ND, BS+ Musculoskeletal: warm, no edema CNS: Poorly arousable, restless    Data Review:    CBC  Recent Labs Lab 06/14/16 0352 06/15/16 0358 06/18/16 0334  WBC 9.5 7.1 7.0  HGB 8.8* 8.3* 7.5*  HCT 26.6*  23.9* 20.4*  PLT 80* 99* 182  MCV 82.9 84.2 100.0  MCH 27.4 29.2 36.8*  MCHC 33.1 34.7 36.8*  RDW 16.1* 16.0* 24.5*  LYMPHSABS 1.0 1.3  --   MONOABS 1.9* 1.6*  --   EOSABS 0.1 0.2  --   BASOSABS 0.0 0.1  --     Chemistries   Recent Labs Lab 06/12/16 1649 06/14/16 0352 06/15/16 0358 06/16/16 0310 06/17/16 0316  06/18/16 0334  NA  --  143 143 144 148* 147*  K  --  3.7 3.8 3.1* 3.6 4.3  CL  --  108 108 106 110 112*  CO2  --  _0 GLUCOSE  --  96 91 124* 107* 90  BUN  --  23* 22* 23* 27* 18  CREATININE  --  0.74 0.86 1.04 1.03 0.78  CALCIUM  --  8.6* 8.4* 8.4* 8.4* 8.5*  MG 2.3 2.2 1.7 1.8 2.1  --    ------------------------------------------------------------------------------------------------------------------ No results for input(s): CHOL, HDL, LDLCALC, TRIG, CHOLHDL, LDLDIRECT in the last 72 hours.  No results found for: HGBA1C ------------------------------------------------------------------------------------------------------------------ No results for input(s): TSH, T4TOTAL, T3FREE, THYROIDAB in the last 72 hours.  Invalid input(s): FREET3 ------------------------------------------------------------------------------------------------------------------ No results for input(s): VITAMINB12, FOLATE, FERRITIN, TIBC, IRON, RETICCTPCT in the last 72 hours.  Coagulation profile No results for input(s): INR, PROTIME in the last 168 hours.  No results for input(s): DDIMER in the last 72 hours.  Cardiac Enzymes No results for input(s): CKMB, TROPONINI, MYOGLOBIN in the last 168 hours.  Invalid input(s): CK ------------------------------------------------------------------------------------------------------------------ No results found for: BNP  Inpatient Medications  Scheduled Meds: . aspirin  81 mg Oral Daily  . chlorhexidine gluconate (MEDLINE KIT)  15 mL Mouth Rinse BID  . chlorproMAZINE  50 mg Oral TID  . famotidine  20 mg Oral BID  . gabapentin  300 mg Oral TID  . ipratropium-albuterol  3 mL Nebulization Q6H  . levothyroxine  25 mcg Oral QAC breakfast  . LORazepam  1 mg Oral BID  . mouth rinse  15 mL Mouth Rinse QID  . mirtazapine  15 mg Oral QHS  . oxcarbazepine  600 mg Oral BID  . QUEtiapine  200 mg Oral QHS  . sennosides  5 mL Oral BID  . sertraline  100 mg  Oral Daily  . trazodone  300 mg Oral QHS  . valproic acid  500 mg Oral BID   Continuous Infusions: . sodium chloride 10 mL/hr at 06/17/16 1800   PRN Meds:.acetaminophen, albuterol, bisacodyl, glycopyrrolate, haloperidol lactate, LORazepam, morphine injection, ondansetron **OR** ondansetron (ZOFRAN) IV  Micro Results Recent Results (from the past 240 hour(s))  MRSA PCR Screening     Status: None   Collection Time: 06/10/16 12:37 AM  Result Value Ref Range Status   MRSA by PCR NEGATIVE NEGATIVE Final    Comment:        The GeneXpert MRSA Assay (FDA approved for NASAL specimens only), is one component of a comprehensive MRSA colonization surveillance program. It is not intended to diagnose MRSA infection nor to guide or monitor treatment for MRSA infections.   Culture, respiratory (NON-Expectorated)     Status: None   Collection Time: 06/11/16  9:31 AM  Result Value Ref Range Status   Specimen Description TRACHEAL ASPIRATE  Final   Special Requests NONE  Final   Gram Stain   Final    ABUNDANT WBC PRESENT, PREDOMINANTLY PMN NO ORGANISMS SEEN    Culture   Final  NO GROWTH 2 DAYS Performed at [facility]    Report Status 06/14/2016 FINAL  Final  Culture, blood (routine x 2)     Status: None   Collection Time: 06/12/16 10:24 PM  Result Value Ref Range Status   Specimen Description BLOOD LEFT ANTECUBITAL  Final   Special Requests BOTTLES DRAWN AEROBIC ONLY 4ML  Final   Culture   Final    NO GROWTH 5 DAYS Performed at [facility]    Report Status 06/18/2016 FINAL  Final  Culture, blood (routine x 2)     Status: None   Collection Time: 06/12/16 10:24 PM  Result Value Ref Range Status   Specimen Description BLOOD LEFT HAND  Final   Special Requests IN PEDIATRIC BOTTLE 2ML  Final   Culture   Final    NO GROWTH 5 DAYS Performed at [facility]    Report Status 06/18/2016 FINAL  Final    Radiology Reports Dg Chest 2 View  Result Date:  06/29/2016 CLINICAL DATA:  Altered mental status. EXAM: CHEST  2 VIEW COMPARISON:  Radiographs of March 04, 2016. FINDINGS: The heart size and mediastinal contours are within normal limits. Both lungs are clear. No pneumothorax or pleural effusion is noted. The visualized skeletal structures are unremarkable. IMPRESSION: No active cardiopulmonary disease. Electronically Signed   By: Marijo Conception, M.D.   On: 07/04/2016 13:14   Dg Abd 1 View  Result Date: 06/14/2016 CLINICAL DATA:  Status post NG tube placement EXAM: ABDOMEN - 1 VIEW COMPARISON:  06/11/2016 FINDINGS: Interval placement of nasogastric tube. The tip is in the projection of body of the stomach and the side port appears below the GE junction. Patchy pulmonary opacities are identified bilaterally. IMPRESSION: 1. NG tube tip is in the projection of the body of the stomach. Electronically Signed   By: Kerby Moors M.D.   On: 06/14/2016 10:59   Ct Head Wo Contrast  Result Date: 07/03/2016 CLINICAL DATA:  Altered mental status with decreasing balance in a fall 2 days ago. EXAM: CT HEAD WITHOUT CONTRAST TECHNIQUE: Contiguous axial images were obtained from the base of the skull through the vertex without intravenous contrast. COMPARISON:  04/14/2016 FINDINGS: Brain: There is no evidence for acute hemorrhage, hydrocephalus, mass lesion, or abnormal extra-axial fluid collection. No definite CT evidence for acute infarction. Diffuse loss of parenchymal volume is consistent with atrophy. Patchy low attenuation in the deep hemispheric and periventricular white matter is nonspecific, but likely reflects chronic microvascular ischemic demyelination. Vascular: No hyperdense vessel or unexpected calcification. Skull: No evidence for fracture. No worrisome lytic or sclerotic lesion. Sinuses/Orbits: The visualized paranasal sinuses and mastoid air cells are clear. Visualized portions of the globes and intraorbital fat are unremarkable. Other: None.  IMPRESSION: Stable.  No acute intracranial abnormality. Atrophy with chronic small vessel white matter ischemic disease. Electronically Signed   By: Misty Stanley M.D.   On: 06/06/2016 13:28   Mr Brain Wo Contrast  Result Date: 06/16/2016 CLINICAL DATA:  55 y/o M; history of increasing drowsiness, confusion, and inability to walk. History of stroke, seizures, and schizophrenia appear EXAM: MRI HEAD WITHOUT CONTRAST TECHNIQUE: Multiplanar, multiecho pulse sequences of the brain and surrounding structures were obtained without intravenous contrast. COMPARISON:  06/24/2016 CT head.  10/27/2015 MRI head. FINDINGS: Brain: Motion degraded T2 FLAIR sequence and inversion recovery sequence. Axial T2 weighted sequence demonstrate small foci of T2 hyperintensity in the bilateral lentiform nuclei and left anterior corona radiata compatible with chronic  lacunar infarcts and/or perivascular spaces. No diffusion signal abnormality. No abnormal susceptibility hypointensity to indicate intracranial hemorrhage. No focal mass effect. Moderate brain parenchymal volume loss is stable. Stable ventricle size. Vascular: Normal flow voids. Skull and upper cervical spine: Normal marrow signal. Sinuses/Orbits: Mild diffuse paranasal sinus mucosal thickening and partial opacification of anterior ethmoid air cells. No abnormal signal of mastoid air cells. Orbits are unremarkable. Other: None. IMPRESSION: 1. Motion degraded study. 2. No evidence of acute infarct, focal mass effect, or intracranial hemorrhage. 3. Stable moderate brain parenchymal volume loss and chronic basal ganglia lacunar infarcts. 4. Mild paranasal sinus disease greatest in anterior ethmoid air cells. Electronically Signed   By: Kristine Garbe M.D.   On: 07/03/2016 19:03   Dg Chest Port 1 View  Result Date: 06/16/2016 CLINICAL DATA:  Respiratory failure. EXAM: PORTABLE CHEST 1 VIEW COMPARISON:  06/15/2016. FINDINGS: Endotracheal tube and NG tube in stable  position. Heart size normal. Bilateral pulmonary infiltrates/edema, right side greater than left again noted. Slight improvement from prior exam . Small bilateral effusions. No pneumothorax. IMPRESSION: 1. Lines and tubes in stable position. 2. Bilateral pulmonary infiltrates/edema, right side greater than left again noted. Slight improvement from prior exam. Small bilateral pleural effusions. Electronically Signed   By: Marcello Moores  Register   On: 06/16/2016 06:37   Dg Chest Port 1 View  Result Date: 06/15/2016 CLINICAL DATA:  Acute respiratory failure with hypoxemia. EXAM: PORTABLE CHEST 1 VIEW COMPARISON:  06/14/2016 FINDINGS: Endotracheal tube in good position.  NG tube in the stomach. Bibasilar airspace disease left greater than right is unchanged. Possible pneumonia. No significant effusion. Negative for edema. IMPRESSION: Endotracheal tube in good position Bibasilar airspace disease unchanged. Electronically Signed   By: Franchot Gallo M.D.   On: 06/15/2016 08:12   Dg Chest Port 1 View  Result Date: 06/14/2016 CLINICAL DATA:  Intubation. EXAM: PORTABLE CHEST 1 VIEW COMPARISON:  06/12/2016 . FINDINGS: Interim removal of NG tube. Endotracheal tube in anatomic position. Fullness of the upper mediastinum noted. Heart size stable. Bilateral pulmonary infiltrates are noted, progressive from prior exam. Small left pleural effusion. No pneumothorax. IMPRESSION: 1. Interim removal of NG tube. Endotracheal tube in stable position . 2. Progressive bilateral multifocal pulmonary infiltrates. Continued follow-up chest x-ray suggested to demonstrate clearing. 3. Upper mediastinal fullness. Although this may be vascular to further evaluate a follow-up PA and lateral chest x-ray is suggested when the patient is capable. Electronically Signed   By: Marcello Moores  Register   On: 06/14/2016 07:04   Dg Chest Port 1 View  Result Date: 06/12/2016 CLINICAL DATA:  Initial evaluation for acute respiratory failure. EXAM: PORTABLE  CHEST 1 VIEW COMPARISON:  Prior radiograph from 06/11/2016. FINDINGS: Patient remains intubated with the tip of an endotracheal tube position 1.5 cm above the carina. Enteric tube courses in the the abdomen. Transverse heart size within normal limits. Allowing for patient rotation, mediastinal silhouette felt to be within normal limits. Mild prominence of the main pulmonary arteries is similar. Lungs hypoinflated. Bibasilar airspace opacities are slightly worsened from prior study, which may reflect worsened atelectasis or infiltrates. New patchy densities within the peripheral right upper lobe as well. No pulmonary edema. No definite pleural effusion. No pneumothorax. Osseous structures unchanged. IMPRESSION: 1. Tip of the endotracheal tube 1.5 cm above the carina. 2. Shallow lung inflation with interval worsening and bibasilar airspace opacities, with new opacity within the peripheral right upper lobe. Findings may reflect progressive infiltrates or atelectasis. Electronically Signed   By: Jeannine Boga  M.D.   On: 06/12/2016 05:55   Dg Chest Port 1 View  Result Date: 06/11/2016 CLINICAL DATA:  ET tube adjustment EXAM: PORTABLE CHEST 1 VIEW COMPARISON:  06/11/2016 at 00:04 FINDINGS: The endotracheal tube is a few mm above the carina. Nasogastric tube extends well into the stomach and beyond the inferior edge of the image. No interval change in the patchy airspace opacity of both medial bases. No large effusions. No pneumothorax. IMPRESSION: 1. ET tube tip is just a few mm above the carina. 2. Nasogastric tube now extends well into the stomach 3. No significant interval change in the bilateral airspace opacities. Electronically Signed   By: Andreas Newport M.D.   On: 06/11/2016 03:45   Portable Chest Xray  Result Date: 06/11/2016 CLINICAL DATA:  Intubation.  Orogastric tube placement. EXAM: PORTABLE CHEST 1 VIEW COMPARISON:  06/09/2016 FINDINGS: Endotracheal tube is borderline low 1 position  proximally 11 mm from the carina, retraction of 2-4 cm recommended. Tip of the enteric tube below the diaphragm in the stomach, side-port in the region of the gastroesophageal junction. Lung volumes are low. Right basilar opacity is less well-defined on the current exam, patchy right basilar opacity persists. There is new patchy left basilar airspace disease favoring atelectasis. Normal heart size. No pulmonary edema, pleural fluid or pneumothorax. Stable osseous structures. IMPRESSION: 1. Borderline low endotracheal tube tip 11 mm from the carina. Retraction of 2-4 cm recommended. 2. Tip of the enteric tube below the diaphragm, side-port at the gastroesophageal junction. Advancement of 5 cm with lead optimal positioning. 3. Improving right lung base aeration with residual patchy opacity. New left lower lobe opacity favoring atelectasis. Electronically Signed   By: Jeb Levering M.D.   On: 06/11/2016 00:50   Dg Chest Port 1 View  Result Date: 06/09/2016 CLINICAL DATA:  Hypoxia EXAM: PORTABLE CHEST 1 VIEW COMPARISON:  06/30/2016 FINDINGS: Cardiomediastinal silhouette is stable. There is streaky infiltrate in right lower lobe laterally highly suspicious for pneumonia. Follow-up to resolution is recommended. No pulmonary edema. IMPRESSION: Streaky infiltrate right lower lobe laterally highly suspicious for pneumonia. Follow-up to resolution is recommended. No pulmonary edema. Electronically Signed   By: Lahoma Crocker M.D.   On: 06/09/2016 15:32   Dg Abd Portable 1v  Result Date: 06/17/2016 CLINICAL DATA:  Nasogastric tube placement. EXAM: PORTABLE ABDOMEN - 1 VIEW COMPARISON:  Abdominal radiograph June 14, 2016 FINDINGS: Feeding tube tip projects in proximal stomach. Gas-filled mildly prominent small and large bowel, incompletely assessed. No intra- abdominal mass effect or pathologic calcifications. Soft tissue planes and included osseous structures are nonsuspicious. IMPRESSION: Feeding tube tip projects  in proximal stomach. Electronically Signed   By: Elon Alas M.D.   On: 06/17/2016 23:40   Dg Abd Portable 1v  Result Date: 06/11/2016 CLINICAL DATA:  Orogastric tube adjustment EXAM: PORTABLE ABDOMEN - 1 VIEW COMPARISON:  06/11/2016 at 00:04 FINDINGS: The orogastric tube has been advanced and now extends well of the stomach with tip in the region of the pylorus or distal antrum. IMPRESSION: Enteric tube extends well into the stomach. Electronically Signed   By: Andreas Newport M.D.   On: 06/11/2016 03:47    Time Spent in minutes  25   Louellen Molder M.D on 06/19/2016 at 1:04 PM  Between 7am to 7pm - Pager - [phone]  After 7pm go to [email or web address] - password [facility]  [facility] -  Office  [phone]

## 2016-06-20 MED ORDER — SODIUM CHLORIDE 0.9 % IV SOLN
3.0000 mg/h | INTRAVENOUS | Status: DC
  Administered 2016-06-20: 3 mg/h via INTRAVENOUS
  Filled 2016-06-20: qty 10

## 2016-06-20 MED ORDER — MORPHINE BOLUS VIA INFUSION
2.0000 mg | INTRAVENOUS | Status: DC | PRN
  Administered 2016-06-20 (×5): 2 mg via INTRAVENOUS
  Filled 2016-06-20: qty 2

## 2016-06-20 MED ORDER — LORAZEPAM 2 MG/ML IJ SOLN
1.0000 mg/h | INTRAVENOUS | Status: DC
  Administered 2016-06-20: 1 mg/h via INTRAVENOUS
  Filled 2016-06-20 (×2): qty 25

## 2016-06-20 MED ORDER — MORPHINE SULFATE (PF) 2 MG/ML IV SOLN
1.0000 mg | Freq: Once | INTRAVENOUS | Status: AC
  Administered 2016-06-20: 1 mg via INTRAVENOUS
  Filled 2016-06-20: qty 1

## 2016-07-05 NOTE — Discharge Summary (Signed)
Physician Discharge Summary  [place] Ducre ZOX:[id number] DOB: 1960-08-20 DOA: 06/24/2016  PCP: Diamantina Providence, FNP  Admit date: 06-24-16 Discharge date: 06/25/2016   Patient expired on 06/17/2016 at 7:02 p.m.   Discharge Diagnoses:  Principal Problem:   Encephalopathy acute   Active Problems:  Sepsis with septic shock(HCC)   Acute encephalopathy   Mental retardation   Hypothyroidism   Thrombocytopenia (HCC)   Seizure (HCC)   Aggressive behavior   Hyponatremia   HCAP (healthcare-associated pneumonia)   Goals of care, counseling/discussion   Encounter for intubation   Acute kidney injury (HCC)   Acute on chronic respiratory failure (HCC)   Encounter for care related to feeding tube  Brief narrative/history of present illness 56 year old male with history of mental retardation, ward of the state, intermittent explosive behavior, psychosis, seizure disorder, CVA, dysphagia, hypothyroidism, anemia and thrombocytopenia who presented with falls and tremors. Patient admitted to hospitalist service, on day 4 of hospitalization developed acute respiratory failure with hypoxia requiring intubation and admitted to ICU. On 12/9 patient started developing oliguric renal failure. Given his poor overall prognosis was made full DO NOT RESUSCITATE. Hospital course prolonged due to ongoing agitation requiring pain medications, anxiolytics. Also having ongoing sepsis. Patient extubated on 12/14 and transferred to hospitalist service. Palliative care consulted and made comfort with aim see for any improvement and able to take by mouth.   Principal Problem:    Encephalopathy acute Worsening underlying medullary retardation complicated by hypoxemic respiratory failure secondary to aspiration pneumonia. Ongoing sepsis. Continues to be septic and minimally responsive. Started on morphine and Ativan drip by palliative care.  Active Problems: Aspiration pneumonia ongoing sepsis. Completed 7 days  of Zosyn. Cultures negative.  Dysphagia Discontinue feeding tube. Unable to take by mouth meds.  Acute hypoxemic respiratory failure Continues to require high amount of oxygen via nasal cannula. Discontinue nebs.  Mental retardation with aggressive behavior and restlessness Poorly responsive.  Seizure disorder Continue Trileptal and valproic acid if tolerated.   Overall prognosis remained poor with ongoing sepsis. Palliative care discussed with patient legal guardian on 12/11 with plan to continue current level of care without escalation of care further (including avoidance of pressors, invasive procedures, central line and made DO NOT RESUSCITATE).  Patient remained encephalopathic, septic and continued to be in respiratory failure. After discussion again with [facility] and palliative care started patient  on morphine drip and Ativan drip for comfort.  Discontinued all by mouth medications.  Patient made for comfort care and expired on 06/10/2016 at 7:02 PM  Discharge Instructions   Allergies as of 07/01/2016      Reactions   Heparin    Other Other (See Comments)   Pt is allergic to chlorine and bleach.   Reaction:  Unknown    Peanut-containing Drug Products Diarrhea      Medication List    ASK your doctor about these medications   atorvastatin 40 MG tablet Commonly known as:  LIPITOR Take 40 mg by mouth at bedtime.   chlorproMAZINE 50 MG tablet Commonly known as:  THORAZINE Take 1 tablet (50 mg total) by mouth 3 (three) times daily.   divalproex 500 MG DR tablet Commonly known as:  DEPAKOTE Take 1,000 mg by mouth daily with breakfast.   gabapentin 300 MG capsule Commonly known as:  NEURONTIN Take 1 capsule (300 mg total) by mouth 3 (three) times daily.   hydrOXYzine 25 MG tablet Commonly known as:  ATARAX/VISTARIL Take 1 tablet (25 mg total) by mouth 3 (  three) times daily.   levothyroxine 25 MCG tablet Commonly known as:  SYNTHROID,  LEVOTHROID Take 1 tablet (25 mcg total) by mouth daily before breakfast.   LORazepam 1 MG tablet Commonly known as:  ATIVAN Take 1 tablet (1 mg total) by mouth 2 (two) times daily.   mirtazapine 15 MG tablet Commonly known as:  REMERON Take 1 tablet (15 mg total) by mouth at bedtime.   oxcarbazepine 600 MG tablet Commonly known as:  TRILEPTAL Take 1 tablet (600 mg total) by mouth 2 (two) times daily.   pantoprazole 40 MG tablet Commonly known as:  PROTONIX Take 1 tablet (40 mg total) by mouth daily.   QUEtiapine 200 MG tablet Commonly known as:  SEROQUEL Take 1 tablet (200 mg total) by mouth at bedtime.   sertraline 100 MG tablet Commonly known as:  ZOLOFT Take 1 tablet (100 mg total) by mouth daily.   tamsulosin 0.4 MG Caps capsule Commonly known as:  FLOMAX Take 1 capsule (0.4 mg total) by mouth at bedtime.   trazodone 300 MG tablet Commonly known as:  DESYREL Take 1 tablet (300 mg total) by mouth at bedtime.       Allergies  Allergen Reactions  . Heparin   . Other Other (See Comments)    Pt is allergic to chlorine and bleach.   Reaction:  Unknown   . Peanut-Containing Drug Products Diarrhea    Consults: PC CM Palliative care  Procedures Intubation MRI brain  Procedures/Studies: Dg Chest 2 View  Result Date: 06-12-16 CLINICAL DATA:  Altered mental status. EXAM: CHEST  2 VIEW COMPARISON:  Radiographs of March 04, 2016. FINDINGS: The heart size and mediastinal contours are within normal limits. Both lungs are clear. No pneumothorax or pleural effusion is noted. The visualized skeletal structures are unremarkable. IMPRESSION: No active cardiopulmonary disease. Electronically Signed   By: Lupita Raider, M.D.   On: June 12, 2016 13:14   Dg Abd 1 View  Result Date: 06/14/2016 CLINICAL DATA:  Status post NG tube placement EXAM: ABDOMEN - 1 VIEW COMPARISON:  06/11/2016 FINDINGS: Interval placement of nasogastric tube. The tip is in the projection of body of  the stomach and the side port appears below the GE junction. Patchy pulmonary opacities are identified bilaterally. IMPRESSION: 1. NG tube tip is in the projection of the body of the stomach. Electronically Signed   By: Signa Kell M.D.   On: 06/14/2016 10:59   Ct Head Wo Contrast  Result Date: Jun 12, 2016 CLINICAL DATA:  Altered mental status with decreasing balance in a fall 2 days ago. EXAM: CT HEAD WITHOUT CONTRAST TECHNIQUE: Contiguous axial images were obtained from the base of the skull through the vertex without intravenous contrast. COMPARISON:  04/14/2016 FINDINGS: Brain: There is no evidence for acute hemorrhage, hydrocephalus, mass lesion, or abnormal extra-axial fluid collection. No definite CT evidence for acute infarction. Diffuse loss of parenchymal volume is consistent with atrophy. Patchy low attenuation in the deep hemispheric and periventricular white matter is nonspecific, but likely reflects chronic microvascular ischemic demyelination. Vascular: No hyperdense vessel or unexpected calcification. Skull: No evidence for fracture. No worrisome lytic or sclerotic lesion. Sinuses/Orbits: The visualized paranasal sinuses and mastoid air cells are clear. Visualized portions of the globes and intraorbital fat are unremarkable. Other: None. IMPRESSION: Stable.  No acute intracranial abnormality. Atrophy with chronic small vessel white matter ischemic disease. Electronically Signed   By: Kennith Center M.D.   On: June 12, 2016 13:28   Mr Brain Wo Contrast  Result  Date: 06/04/2016 CLINICAL DATA:  56 y/o M; history of increasing drowsiness, confusion, and inability to walk. History of stroke, seizures, and schizophrenia appear EXAM: MRI HEAD WITHOUT CONTRAST TECHNIQUE: Multiplanar, multiecho pulse sequences of the brain and surrounding structures were obtained without intravenous contrast. COMPARISON:  06/14/2016 CT head.  10/27/2015 MRI head. FINDINGS: Brain: Motion degraded T2 FLAIR sequence and  inversion recovery sequence. Axial T2 weighted sequence demonstrate small foci of T2 hyperintensity in the bilateral lentiform nuclei and left anterior corona radiata compatible with chronic lacunar infarcts and/or perivascular spaces. No diffusion signal abnormality. No abnormal susceptibility hypointensity to indicate intracranial hemorrhage. No focal mass effect. Moderate brain parenchymal volume loss is stable. Stable ventricle size. Vascular: Normal flow voids. Skull and upper cervical spine: Normal marrow signal. Sinuses/Orbits: Mild diffuse paranasal sinus mucosal thickening and partial opacification of anterior ethmoid air cells. No abnormal signal of mastoid air cells. Orbits are unremarkable. Other: None. IMPRESSION: 1. Motion degraded study. 2. No evidence of acute infarct, focal mass effect, or intracranial hemorrhage. 3. Stable moderate brain parenchymal volume loss and chronic basal ganglia lacunar infarcts. 4. Mild paranasal sinus disease greatest in anterior ethmoid air cells. Electronically Signed   By: Mitzi Hansen M.D.   On: 06/29/2016 19:03   Dg Chest Port 1 View  Result Date: 06/16/2016 CLINICAL DATA:  Respiratory failure. EXAM: PORTABLE CHEST 1 VIEW COMPARISON:  06/15/2016. FINDINGS: Endotracheal tube and NG tube in stable position. Heart size normal. Bilateral pulmonary infiltrates/edema, right side greater than left again noted. Slight improvement from prior exam . Small bilateral effusions. No pneumothorax. IMPRESSION: 1. Lines and tubes in stable position. 2. Bilateral pulmonary infiltrates/edema, right side greater than left again noted. Slight improvement from prior exam. Small bilateral pleural effusions. Electronically Signed   By: Maisie Fus  Register   On: 06/16/2016 06:37   Dg Chest Port 1 View  Result Date: 06/15/2016 CLINICAL DATA:  Acute respiratory failure with hypoxemia. EXAM: PORTABLE CHEST 1 VIEW COMPARISON:  06/14/2016 FINDINGS: Endotracheal tube in good  position.  NG tube in the stomach. Bibasilar airspace disease left greater than right is unchanged. Possible pneumonia. No significant effusion. Negative for edema. IMPRESSION: Endotracheal tube in good position Bibasilar airspace disease unchanged. Electronically Signed   By: Marlan Palau M.D.   On: 06/15/2016 08:12   Dg Chest Port 1 View  Result Date: 06/14/2016 CLINICAL DATA:  Intubation. EXAM: PORTABLE CHEST 1 VIEW COMPARISON:  06/12/2016 . FINDINGS: Interim removal of NG tube. Endotracheal tube in anatomic position. Fullness of the upper mediastinum noted. Heart size stable. Bilateral pulmonary infiltrates are noted, progressive from prior exam. Small left pleural effusion. No pneumothorax. IMPRESSION: 1. Interim removal of NG tube. Endotracheal tube in stable position . 2. Progressive bilateral multifocal pulmonary infiltrates. Continued follow-up chest x-ray suggested to demonstrate clearing. 3. Upper mediastinal fullness. Although this may be vascular to further evaluate a follow-up PA and lateral chest x-ray is suggested when the patient is capable. Electronically Signed   By: Maisie Fus  Register   On: 06/14/2016 07:04   Dg Chest Port 1 View  Result Date: 06/12/2016 CLINICAL DATA:  Initial evaluation for acute respiratory failure. EXAM: PORTABLE CHEST 1 VIEW COMPARISON:  Prior radiograph from 06/11/2016. FINDINGS: Patient remains intubated with the tip of an endotracheal tube position 1.5 cm above the carina. Enteric tube courses in the the abdomen. Transverse heart size within normal limits. Allowing for patient rotation, mediastinal silhouette felt to be within normal limits. Mild prominence of the main pulmonary arteries is similar.  Lungs hypoinflated. Bibasilar airspace opacities are slightly worsened from prior study, which may reflect worsened atelectasis or infiltrates. New patchy densities within the peripheral right upper lobe as well. No pulmonary edema. No definite pleural effusion. No  pneumothorax. Osseous structures unchanged. IMPRESSION: 1. Tip of the endotracheal tube 1.5 cm above the carina. 2. Shallow lung inflation with interval worsening and bibasilar airspace opacities, with new opacity within the peripheral right upper lobe. Findings may reflect progressive infiltrates or atelectasis. Electronically Signed   By: Rise MuBenjamin  McClintock M.D.   On: 06/12/2016 05:55   Dg Chest Port 1 View  Result Date: 06/11/2016 CLINICAL DATA:  ET tube adjustment EXAM: PORTABLE CHEST 1 VIEW COMPARISON:  06/11/2016 at 00:04 FINDINGS: The endotracheal tube is a few mm above the carina. Nasogastric tube extends well into the stomach and beyond the inferior edge of the image. No interval change in the patchy airspace opacity of both medial bases. No large effusions. No pneumothorax. IMPRESSION: 1. ET tube tip is just a few mm above the carina. 2. Nasogastric tube now extends well into the stomach 3. No significant interval change in the bilateral airspace opacities. Electronically Signed   By: Ellery Plunkaniel R Mitchell M.D.   On: 06/11/2016 03:45   Portable Chest Xray  Result Date: 06/11/2016 CLINICAL DATA:  Intubation.  Orogastric tube placement. EXAM: PORTABLE CHEST 1 VIEW COMPARISON:  06/09/2016 FINDINGS: Endotracheal tube is borderline low 1 position proximally 11 mm from the carina, retraction of 2-4 cm recommended. Tip of the enteric tube below the diaphragm in the stomach, side-port in the region of the gastroesophageal junction. Lung volumes are low. Right basilar opacity is less well-defined on the current exam, patchy right basilar opacity persists. There is new patchy left basilar airspace disease favoring atelectasis. Normal heart size. No pulmonary edema, pleural fluid or pneumothorax. Stable osseous structures. IMPRESSION: 1. Borderline low endotracheal tube tip 11 mm from the carina. Retraction of 2-4 cm recommended. 2. Tip of the enteric tube below the diaphragm, side-port at the gastroesophageal  junction. Advancement of 5 cm with lead optimal positioning. 3. Improving right lung base aeration with residual patchy opacity. New left lower lobe opacity favoring atelectasis. Electronically Signed   By: Rubye OaksMelanie  Ehinger M.D.   On: 06/11/2016 00:50   Dg Chest Port 1 View  Result Date: 06/09/2016 CLINICAL DATA:  Hypoxia EXAM: PORTABLE CHEST 1 VIEW COMPARISON:  07/04/2016 FINDINGS: Cardiomediastinal silhouette is stable. There is streaky infiltrate in right lower lobe laterally highly suspicious for pneumonia. Follow-up to resolution is recommended. No pulmonary edema. IMPRESSION: Streaky infiltrate right lower lobe laterally highly suspicious for pneumonia. Follow-up to resolution is recommended. No pulmonary edema. Electronically Signed   By: Natasha MeadLiviu  Pop M.D.   On: 06/09/2016 15:32   Dg Abd Portable 1v  Result Date: 06/17/2016 CLINICAL DATA:  Nasogastric tube placement. EXAM: PORTABLE ABDOMEN - 1 VIEW COMPARISON:  Abdominal radiograph June 14, 2016 FINDINGS: Feeding tube tip projects in proximal stomach. Gas-filled mildly prominent small and large bowel, incompletely assessed. No intra- abdominal mass effect or pathologic calcifications. Soft tissue planes and included osseous structures are nonsuspicious. IMPRESSION: Feeding tube tip projects in proximal stomach. Electronically Signed   By: Awilda Metroourtnay  Bloomer M.D.   On: 06/17/2016 23:40   Dg Abd Portable 1v  Result Date: 06/11/2016 CLINICAL DATA:  Orogastric tube adjustment EXAM: PORTABLE ABDOMEN - 1 VIEW COMPARISON:  06/11/2016 at 00:04 FINDINGS: The orogastric tube has been advanced and now extends well of the stomach with tip in the region  of the pylorus or distal antrum. IMPRESSION: Enteric tube extends well into the stomach. Electronically Signed   By: Ellery Plunkaniel R Mitchell M.D.   On: 06/11/2016 03:47     Discharge Exam: Vitals:   06/19/16 2051 07/01/2016 0500  BP: (!) 103/56 (!) 106/56  Pulse: 90 88  Resp: (!) 28 (!) 28  Temp: 98.2 F  (36.8 C) 97.6 F (36.4 C)   Vitals:   06/19/16 2051 06/19/2016 0500 06/21/2016 0823 06/05/2016 1900  BP: (!) 103/56 (!) 106/56    Pulse: 90 88    Resp: (!) 28 (!) 28    Temp: 98.2 F (36.8 C) 97.6 F (36.4 C)    TempSrc: Axillary Axillary    SpO2: 95% 98% 90%   Weight:  95.8 kg (211 lb 4.8 oz)  95.8 kg (211 lb 4.8 oz)  Height:            The results of significant diagnostics from this hospitalization (including imaging, microbiology, ancillary and laboratory) are listed below for reference.     Microbiology: No results found for this or any previous visit (from the past 240 hour(s)).   Labs: BNP (last 3 results) No results for input(s): BNP in the last 8760 hours. Basic Metabolic Panel: No results for input(s): NA, K, CL, CO2, GLUCOSE, BUN, CREATININE, CALCIUM, MG, PHOS in the last 168 hours. Liver Function Tests: No results for input(s): AST, ALT, ALKPHOS, BILITOT, PROT, ALBUMIN in the last 168 hours. No results for input(s): LIPASE, AMYLASE in the last 168 hours. No results for input(s): AMMONIA in the last 168 hours. CBC: No results for input(s): WBC, NEUTROABS, HGB, HCT, MCV, PLT in the last 168 hours. Cardiac Enzymes: No results for input(s): CKTOTAL, CKMB, CKMBINDEX, TROPONINI in the last 168 hours. BNP: Invalid input(s): POCBNP CBG:  Recent Labs Lab 06/18/16 2354 06/19/16 0431 06/19/16 0755 06/19/16 1208 06/19/16 1606  GLUCAP 96 93 90 96 120*   D-Dimer No results for input(s): DDIMER in the last 72 hours. Hgb A1c No results for input(s): HGBA1C in the last 72 hours. Lipid Profile No results for input(s): CHOL, HDL, LDLCALC, TRIG, CHOLHDL, LDLDIRECT in the last 72 hours. Thyroid function studies No results for input(s): TSH, T4TOTAL, T3FREE, THYROIDAB in the last 72 hours.  Invalid input(s): FREET3 Anemia work up No results for input(s): VITAMINB12, FOLATE, FERRITIN, TIBC, IRON, RETICCTPCT in the last 72 hours. Urinalysis    Component Value  Date/Time   COLORURINE YELLOW 2015/10/02 1321   APPEARANCEUR CLEAR 2015/10/02 1321   LABSPEC 1.012 2015/10/02 1321   PHURINE 7.0 2015/10/02 1321   GLUCOSEU NEGATIVE 2015/10/02 1321   HGBUR SMALL (A) 2015/10/02 1321   BILIRUBINUR NEGATIVE 2015/10/02 1321   KETONESUR NEGATIVE 2015/10/02 1321   PROTEINUR NEGATIVE 2015/10/02 1321   NITRITE NEGATIVE 2015/10/02 1321   LEUKOCYTESUR NEGATIVE 2015/10/02 1321   Sepsis Labs Invalid input(s): PROCALCITONIN,  WBC,  LACTICIDVEN Microbiology No results found for this or any previous visit (from the past 240 hour(s)).   Time coordinating discharge: <30 minutes  SIGNED:   Eddie NorthHUNGEL, Lisabeth Mian, MD  [facility] 06/25/2016, 4:57 PM Pager   If 7PM-7AM, please contact night-coverage [email or web address] Password [email or web address]

## 2016-07-05 NOTE — Progress Notes (Signed)
PROGRESS NOTE                                                                                                                                                                                                             Patient Demographics:    Jeff Foster, is a 56 y.o. male, DOB - May 07, 1961, ZBC:[id number]  Admit date - 07/02/2016   Admitting Physician Norval Morton, MD  Outpatient Primary MD for the patient is Vonna Drafts, FNP  LOS - 12    Chief Complaint  Patient presents with  . Fall  . Tremors       Brief Narrative  56 year old male with history of mental retardation, ward of the state, intermittent explosive behavior, psychosis, seizure disorder, CVA, dysphagia, hypothyroidism, anemia and thrombocytopenia who presented with falls and tremors. Patient admitted to hospitalist service, on day 4 of hospitalization developed acute respiratory failure with hypoxia requiring intubation and admitted to ICU. On 12/9 patient started developing oliguric renal failure. Given his poor overall prognosis was made full DO NOT RESUSCITATE. Hospital course prolonged due to ongoing agitation requiring pain medications, anxiolytics. Also having ongoing sepsis. Patient extubated on 12/14 and transferred to hospitalist service. Palliative care consulted and made comfort with aim see for any improvement and able to take by mouth.      Subjective:   Symptoms unchanged, remains tachypneic and febrile. Unable to take anything by mouth. Still requiring restraints.  Assessment  & Plan :    Principal Problem:   Encephalopathy acute Worsening underlying medullary retardation complicated by hypoxemic respiratory failure secondary to aspiration pneumonia. Ongoing sepsis. Continues to be septic and minimally responsive. Started on morphine and Ativan drip by palliative care.  Active Problems: Aspiration pneumonia ongoing sepsis.  Completed 7 days of Zosyn. Cultures negative.  Dysphagia Discontinue feeding tube. Unable to take by mouth meds.  Acute hypoxemic respiratory failure Continues to require high amount of oxygen via nasal cannula. Discontinue nebs.  Mental retardation with aggressive behavior and restlessness Poorly responsive.  Seizure disorder Continue Trileptal and valproic acid if tolerated.   Overall prognosis remains guarded. Palliative care discussed with patient legal guardian on 12/11 with plan to continue current level of care without escalation of care further (including avoidance of pressors, invasive procedures, central line and made DO NOT RESUSCITATE).  Patient has shown no progress  and remains encephalopathic, septic and continues to be in respiratory failure. Started on morphine drip and Ativan drip for comfort by palliative care today. Discontinued all by mouth antibiotics.       Code Status : DO NOT RESUSCITATE, comfort measures  Family Communication  : None at bedside  Disposition Plan  : Needs hospice. In-hospital death possible  Barriers For Discharge : Active symptoms  Consults  :   PC CM Palliative care  Procedures  :  Intubation MRI brain CT head   DVT Prophylaxis  : Comfort measures  Lab Results  Component Value Date   PLT 182 06/18/2016    Antibiotics  :    Anti-infectives    Start     Dose/Rate Route Frequency Ordered Stop   06/12/16 2245  vancomycin (VANCOCIN) IVPB 1000 mg/200 mL premix  Status:  Discontinued     1,000 mg 200 mL/hr over 60 Minutes Intravenous Every 8 hours 06/12/16 2243 06/14/16 0901   06/09/16 2200  piperacillin-tazobactam (ZOSYN) IVPB 3.375 g     3.375 g 12.5 mL/hr over 240 Minutes Intravenous Every 8 hours 06/09/16 1515 06/15/16 2359   06/09/16 1600  piperacillin-tazobactam (ZOSYN) IVPB 3.375 g     3.375 g 100 mL/hr over 30 Minutes Intravenous  Once 06/09/16 1515 06/09/16 1629   06/09/16 1500  fluconazole (DIFLUCAN) IVPB 200  mg  Status:  Discontinued     200 mg 100 mL/hr over 60 Minutes Intravenous Every 24 hours 06/09/16 1429 06/14/16 0901        Objective:   Vitals:   06/19/16 1429 06/19/16 2051 2016/06/26 0500 2016/06/26 0823  BP: (!) 108/53 (!) 103/56 (!) 106/56   Pulse: 91 90 88   Resp: (!) 26 (!) 28 (!) 28   Temp: (!) 101.6 F (38.7 C) 98.2 F (36.8 C) 97.6 F (36.4 C)   TempSrc: Axillary Axillary Axillary   SpO2: 94% 95% 98% 90%  Weight:   95.8 kg (211 lb 4.8 oz)   Height:        Wt Readings from Last 3 Encounters:  26-Jun-2016 95.8 kg (211 lb 4.8 oz)  03/12/16 97 kg (213 lb 13.5 oz)  03/04/16 99.3 kg (219 lb)     Intake/Output Summary (Last 24 hours) at 06/26/16 1113 Last data filed at 06-26-16 0521  Gross per 24 hour  Intake                0 ml  Output              900 ml  Net             -900 ml     Physical Exam  Gen: An agitated, poorly arousable, tachypneic HEENT: Moist mucosa Chest: Tachypnea, clear breath sounds bilaterally, no added sounds CVS: S1 and S2 tachycardic, no murmurs GI: soft, NT, ND, BS+ Musculoskeletal: warm, no edema CNS: Poorly arousable, restless    Data Review:    CBC  Recent Labs Lab 06/14/16 0352 06/15/16 0358 06/18/16 0334  WBC 9.5 7.1 7.0  HGB 8.8* 8.3* 7.5*  HCT 26.6* 23.9* 20.4*  PLT 80* 99* 182  MCV 82.9 84.2 100.0  MCH 27.4 29.2 36.8*  MCHC 33.1 34.7 36.8*  RDW 16.1* 16.0* 24.5*  LYMPHSABS 1.0 1.3  --   MONOABS 1.9* 1.6*  --   EOSABS 0.1 0.2  --   BASOSABS 0.0 0.1  --     Chemistries   Recent Labs Lab 06/14/16 0352 06/15/16 0358 06/16/16 0310  06/17/16 0316 06/18/16 0334  NA 143 143 144 148* 147*  K 3.7 3.8 3.1* 3.6 4.3  CL 108 108 106 110 112*  CO2 _0 GLUCOSE 96 91 124* 107* 90  BUN 23* 22* 23* 27* 18  CREATININE 0.74 0.86 1.04 1.03 0.78  CALCIUM 8.6* 8.4* 8.4* 8.4* 8.5*  MG 2.2 1.7 1.8 2.1  --     ------------------------------------------------------------------------------------------------------------------ No results for input(s): CHOL, HDL, LDLCALC, TRIG, CHOLHDL, LDLDIRECT in the last 72 hours.  No results found for: HGBA1C ------------------------------------------------------------------------------------------------------------------ No results for input(s): TSH, T4TOTAL, T3FREE, THYROIDAB in the last 72 hours.  Invalid input(s): FREET3 ------------------------------------------------------------------------------------------------------------------ No results for input(s): VITAMINB12, FOLATE, FERRITIN, TIBC, IRON, RETICCTPCT in the last 72 hours.  Coagulation profile No results for input(s): INR, PROTIME in the last 168 hours.  No results for input(s): DDIMER in the last 72 hours.  Cardiac Enzymes No results for input(s): CKMB, TROPONINI, MYOGLOBIN in the last 168 hours.  Invalid input(s): CK ------------------------------------------------------------------------------------------------------------------ No results found for: BNP  Inpatient Medications  Scheduled Meds: . chlorhexidine gluconate (MEDLINE KIT)  15 mL Mouth Rinse BID  . mouth rinse  15 mL Mouth Rinse QID  . sennosides  5 mL Oral BID   Continuous Infusions: . sodium chloride 10 mL/hr at 06/17/16 1800  . LORazepam (ATIVAN) infusion 1 mg/hr (June 30, 2016 0902)  . morphine 3 mg/hr (June 30, 2016 0902)   PRN Meds:.acetaminophen, bisacodyl, glycopyrrolate, haloperidol lactate, LORazepam, morphine **AND** morphine, ondansetron **OR** ondansetron (ZOFRAN) IV  Micro Results Recent Results (from the past 240 hour(s))  Culture, respiratory (NON-Expectorated)     Status: None   Collection Time: 06/11/16  9:31 AM  Result Value Ref Range Status   Specimen Description TRACHEAL ASPIRATE  Final   Special Requests NONE  Final   Gram Stain   Final    ABUNDANT WBC PRESENT, PREDOMINANTLY PMN NO ORGANISMS SEEN     Culture   Final    NO GROWTH 2 DAYS Performed at [facility]    Report Status 06/14/2016 FINAL  Final  Culture, blood (routine x 2)     Status: None   Collection Time: 06/12/16 10:24 PM  Result Value Ref Range Status   Specimen Description BLOOD LEFT ANTECUBITAL  Final   Special Requests BOTTLES DRAWN AEROBIC ONLY 4ML  Final   Culture   Final    NO GROWTH 5 DAYS Performed at [facility]    Report Status 06/18/2016 FINAL  Final  Culture, blood (routine x 2)     Status: None   Collection Time: 06/12/16 10:24 PM  Result Value Ref Range Status   Specimen Description BLOOD LEFT HAND  Final   Special Requests IN PEDIATRIC BOTTLE 2ML  Final   Culture   Final    NO GROWTH 5 DAYS Performed at [facility]    Report Status 06/18/2016 FINAL  Final    Radiology Reports Dg Chest 2 View  Result Date: 07/03/2016 CLINICAL DATA:  Altered mental status. EXAM: CHEST  2 VIEW COMPARISON:  Radiographs of March 04, 2016. FINDINGS: The heart size and mediastinal contours are within normal limits. Both lungs are clear. No pneumothorax or pleural effusion is noted. The visualized skeletal structures are unremarkable. IMPRESSION: No active cardiopulmonary disease. Electronically Signed   By: Marijo Conception, M.D.   On: 06/26/2016 13:14   Dg Abd 1 View  Result Date: 06/14/2016 CLINICAL DATA:  Status post NG tube placement EXAM: ABDOMEN - 1 VIEW COMPARISON:  06/11/2016 FINDINGS: Interval placement of nasogastric tube. The tip is in the projection of body of the stomach and the side port appears below the GE junction. Patchy pulmonary opacities are identified bilaterally. IMPRESSION: 1. NG tube tip is in the projection of the body of the stomach. Electronically Signed   By: Kerby Moors M.D.   On: 06/14/2016 10:59   Ct Head Wo Contrast  Result Date: 07/01/2016 CLINICAL DATA:  Altered mental status with decreasing balance in a fall 2 days ago. EXAM: CT HEAD WITHOUT CONTRAST  TECHNIQUE: Contiguous axial images were obtained from the base of the skull through the vertex without intravenous contrast. COMPARISON:  04/14/2016 FINDINGS: Brain: There is no evidence for acute hemorrhage, hydrocephalus, mass lesion, or abnormal extra-axial fluid collection. No definite CT evidence for acute infarction. Diffuse loss of parenchymal volume is consistent with atrophy. Patchy low attenuation in the deep hemispheric and periventricular white matter is nonspecific, but likely reflects chronic microvascular ischemic demyelination. Vascular: No hyperdense vessel or unexpected calcification. Skull: No evidence for fracture. No worrisome lytic or sclerotic lesion. Sinuses/Orbits: The visualized paranasal sinuses and mastoid air cells are clear. Visualized portions of the globes and intraorbital fat are unremarkable. Other: None. IMPRESSION: Stable.  No acute intracranial abnormality. Atrophy with chronic small vessel white matter ischemic disease. Electronically Signed   By: Misty Stanley M.D.   On: 06/29/2016 13:28   Mr Brain Wo Contrast  Result Date: 06/15/2016 CLINICAL DATA:  56 y/o M; history of increasing drowsiness, confusion, and inability to walk. History of stroke, seizures, and schizophrenia appear EXAM: MRI HEAD WITHOUT CONTRAST TECHNIQUE: Multiplanar, multiecho pulse sequences of the brain and surrounding structures were obtained without intravenous contrast. COMPARISON:  06/14/2016 CT head.  10/27/2015 MRI head. FINDINGS: Brain: Motion degraded T2 FLAIR sequence and inversion recovery sequence. Axial T2 weighted sequence demonstrate small foci of T2 hyperintensity in the bilateral lentiform nuclei and left anterior corona radiata compatible with chronic lacunar infarcts and/or perivascular spaces. No diffusion signal abnormality. No abnormal susceptibility hypointensity to indicate intracranial hemorrhage. No focal mass effect. Moderate brain parenchymal volume loss is stable. Stable  ventricle size. Vascular: Normal flow voids. Skull and upper cervical spine: Normal marrow signal. Sinuses/Orbits: Mild diffuse paranasal sinus mucosal thickening and partial opacification of anterior ethmoid air cells. No abnormal signal of mastoid air cells. Orbits are unremarkable. Other: None. IMPRESSION: 1. Motion degraded study. 2. No evidence of acute infarct, focal mass effect, or intracranial hemorrhage. 3. Stable moderate brain parenchymal volume loss and chronic basal ganglia lacunar infarcts. 4. Mild paranasal sinus disease greatest in anterior ethmoid air cells. Electronically Signed   By: Kristine Garbe M.D.   On: 06/06/2016 19:03   Dg Chest Port 1 View  Result Date: 06/16/2016 CLINICAL DATA:  Respiratory failure. EXAM: PORTABLE CHEST 1 VIEW COMPARISON:  06/15/2016. FINDINGS: Endotracheal tube and NG tube in stable position. Heart size normal. Bilateral pulmonary infiltrates/edema, right side greater than left again noted. Slight improvement from prior exam . Small bilateral effusions. No pneumothorax. IMPRESSION: 1. Lines and tubes in stable position. 2. Bilateral pulmonary infiltrates/edema, right side greater than left again noted. Slight improvement from prior exam. Small bilateral pleural effusions. Electronically Signed   By: Marcello Moores  Register   On: 06/16/2016 06:37   Dg Chest Port 1 View  Result Date: 06/15/2016 CLINICAL DATA:  Acute respiratory failure with hypoxemia. EXAM: PORTABLE CHEST 1 VIEW COMPARISON:  06/14/2016 FINDINGS: Endotracheal tube in good position.  NG tube in the stomach. Bibasilar airspace disease  left greater than right is unchanged. Possible pneumonia. No significant effusion. Negative for edema. IMPRESSION: Endotracheal tube in good position Bibasilar airspace disease unchanged. Electronically Signed   By: Marlan Palau M.D.   On: 06/15/2016 08:12   Dg Chest Port 1 View  Result Date: 06/14/2016 CLINICAL DATA:  Intubation. EXAM: PORTABLE CHEST 1 VIEW  COMPARISON:  06/12/2016 . FINDINGS: Interim removal of NG tube. Endotracheal tube in anatomic position. Fullness of the upper mediastinum noted. Heart size stable. Bilateral pulmonary infiltrates are noted, progressive from prior exam. Small left pleural effusion. No pneumothorax. IMPRESSION: 1. Interim removal of NG tube. Endotracheal tube in stable position . 2. Progressive bilateral multifocal pulmonary infiltrates. Continued follow-up chest x-ray suggested to demonstrate clearing. 3. Upper mediastinal fullness. Although this may be vascular to further evaluate a follow-up PA and lateral chest x-ray is suggested when the patient is capable. Electronically Signed   By: Maisie Fus  Register   On: 06/14/2016 07:04   Dg Chest Port 1 View  Result Date: 06/12/2016 CLINICAL DATA:  Initial evaluation for acute respiratory failure. EXAM: PORTABLE CHEST 1 VIEW COMPARISON:  Prior radiograph from 06/11/2016. FINDINGS: Patient remains intubated with the tip of an endotracheal tube position 1.5 cm above the carina. Enteric tube courses in the the abdomen. Transverse heart size within normal limits. Allowing for patient rotation, mediastinal silhouette felt to be within normal limits. Mild prominence of the main pulmonary arteries is similar. Lungs hypoinflated. Bibasilar airspace opacities are slightly worsened from prior study, which may reflect worsened atelectasis or infiltrates. New patchy densities within the peripheral right upper lobe as well. No pulmonary edema. No definite pleural effusion. No pneumothorax. Osseous structures unchanged. IMPRESSION: 1. Tip of the endotracheal tube 1.5 cm above the carina. 2. Shallow lung inflation with interval worsening and bibasilar airspace opacities, with new opacity within the peripheral right upper lobe. Findings may reflect progressive infiltrates or atelectasis. Electronically Signed   By: Rise Mu M.D.   On: 06/12/2016 05:55   Dg Chest Port 1 View  Result Date:  06/11/2016 CLINICAL DATA:  ET tube adjustment EXAM: PORTABLE CHEST 1 VIEW COMPARISON:  06/11/2016 at 00:04 FINDINGS: The endotracheal tube is a few mm above the carina. Nasogastric tube extends well into the stomach and beyond the inferior edge of the image. No interval change in the patchy airspace opacity of both medial bases. No large effusions. No pneumothorax. IMPRESSION: 1. ET tube tip is just a few mm above the carina. 2. Nasogastric tube now extends well into the stomach 3. No significant interval change in the bilateral airspace opacities. Electronically Signed   By: Ellery Plunk M.D.   On: 06/11/2016 03:45   Portable Chest Xray  Result Date: 06/11/2016 CLINICAL DATA:  Intubation.  Orogastric tube placement. EXAM: PORTABLE CHEST 1 VIEW COMPARISON:  06/09/2016 FINDINGS: Endotracheal tube is borderline low 1 position proximally 11 mm from the carina, retraction of 2-4 cm recommended. Tip of the enteric tube below the diaphragm in the stomach, side-port in the region of the gastroesophageal junction. Lung volumes are low. Right basilar opacity is less well-defined on the current exam, patchy right basilar opacity persists. There is new patchy left basilar airspace disease favoring atelectasis. Normal heart size. No pulmonary edema, pleural fluid or pneumothorax. Stable osseous structures. IMPRESSION: 1. Borderline low endotracheal tube tip 11 mm from the carina. Retraction of 2-4 cm recommended. 2. Tip of the enteric tube below the diaphragm, side-port at the gastroesophageal junction. Advancement of 5 cm with lead optimal  positioning. 3. Improving right lung base aeration with residual patchy opacity. New left lower lobe opacity favoring atelectasis. Electronically Signed   By: Jeb Levering M.D.   On: 06/11/2016 00:50   Dg Chest Port 1 View  Result Date: 06/09/2016 CLINICAL DATA:  Hypoxia EXAM: PORTABLE CHEST 1 VIEW COMPARISON:  06/14/2016 FINDINGS: Cardiomediastinal silhouette is stable.  There is streaky infiltrate in right lower lobe laterally highly suspicious for pneumonia. Follow-up to resolution is recommended. No pulmonary edema. IMPRESSION: Streaky infiltrate right lower lobe laterally highly suspicious for pneumonia. Follow-up to resolution is recommended. No pulmonary edema. Electronically Signed   By: Lahoma Crocker M.D.   On: 06/09/2016 15:32   Dg Abd Portable 1v  Result Date: 06/17/2016 CLINICAL DATA:  Nasogastric tube placement. EXAM: PORTABLE ABDOMEN - 1 VIEW COMPARISON:  Abdominal radiograph June 14, 2016 FINDINGS: Feeding tube tip projects in proximal stomach. Gas-filled mildly prominent small and large bowel, incompletely assessed. No intra- abdominal mass effect or pathologic calcifications. Soft tissue planes and included osseous structures are nonsuspicious. IMPRESSION: Feeding tube tip projects in proximal stomach. Electronically Signed   By: Elon Alas M.D.   On: 06/17/2016 23:40   Dg Abd Portable 1v  Result Date: 06/11/2016 CLINICAL DATA:  Orogastric tube adjustment EXAM: PORTABLE ABDOMEN - 1 VIEW COMPARISON:  06/11/2016 at 00:04 FINDINGS: The orogastric tube has been advanced and now extends well of the stomach with tip in the region of the pylorus or distal antrum. IMPRESSION: Enteric tube extends well into the stomach. Electronically Signed   By: Andreas Newport M.D.   On: 06/11/2016 03:47    Time Spent in minutes  25   Louellen Molder M.D on 2016-06-28 at 11:13 AM  Between 7am to 7pm - Pager - [phone]  After 7pm go to [email or web address] - password [facility]  [facility] -  Office  [phone]

## 2016-07-05 NOTE — Progress Notes (Signed)
RN at bedside for shift change report. Pt with no respirations or heartbeat.  Death pronounced at 1902 with Mick SellShannon Earlin Sweeden RN.

## 2016-07-05 NOTE — Progress Notes (Signed)
Daily Progress Note   Patient Name: Jeff Foster       Date: 16-Jul-2016 DOB: [birth year]  Age: 57 y.o. MRN#: [id number] Attending Physician: Louellen Molder, MD Primary Care Physician: Vonna Drafts, FNP Admit Date: 06/14/2016  Reason for Consultation/Follow-up: Establishing goals of care  Subjective: Unresponsive Comfortable Started on continuous infusions See below Length of Stay: 12  Current Medications: Scheduled Meds:  . chlorhexidine gluconate (MEDLINE KIT)  15 mL Mouth Rinse BID  . mouth rinse  15 mL Mouth Rinse QID  . sennosides  5 mL Oral BID    Continuous Infusions: . sodium chloride 10 mL/hr at 06/17/16 1800  . LORazepam (ATIVAN) infusion 2 mg/hr (Jul 16, 2016 1318)  . morphine 3 mg/hr (07/16/2016 0902)    PRN Meds: acetaminophen, bisacodyl, glycopyrrolate, haloperidol lactate, LORazepam, morphine **AND** morphine, ondansetron **OR** ondansetron (ZOFRAN) IV  Physical Exam  Constitutional: He has a sickly appearance. Nasal cannula in place.  HENT:  Head: Normocephalic and atraumatic.  Cardiovascular: Normal rate and regular rhythm.   Pulmonary/Chest:  On high flow O2 [place], escalating O2 requirements.   Abdominal: Soft. Normal appearance.  Musculoskeletal:   Does not follow commands. Hands in mitts and wrist restraints in place.   Neurological:    unresponsive.  Skin: Skin is warm and dry. There is pallor.  Psychiatric:            Vital Signs: BP (!) 106/56 (BP Location: Left Arm)   Pulse 88   Temp 97.6 F (36.4 C) (Axillary)   Resp (!) 28   Ht 6' 2"  (1.88 m)   Wt 95.8 kg (211 lb 4.8 oz)   SpO2 90%   BMI 27.13 kg/m  SpO2: SpO2: 90 % O2 Device: O2 Device: High Flow Nasal Cannula (salter) O2 Flow Rate: O2 Flow Rate (L/min): 12 L/min  Intake/output summary:    Intake/Output Summary (Last 24 hours) at 07/16/2016 1539 Last data filed at Jul 16, 2016 0521  Gross per 24 hour  Intake                0 ml  Output              900 ml  Net             -900 ml   LBM: Last BM Date: 06/16/16 Baseline Weight: Weight: 91.6  kg (202 lb) Most recent weight: Weight: 95.8 kg (211 lb 4.8 oz)  Flowsheet Rows   Flowsheet Row Most Recent Value  Intake Tab  Referral Department  Hospitalist  Unit at Time of Referral  ICU  Palliative Care Primary Diagnosis  Neurology  Date Notified  06/09/16  Palliative Care Type  Return patient Palliative Care  Reason for referral  Clarify Goals of Care  Date of Admission  07/01/2016  # of days IP prior to Palliative referral  2  Clinical Assessment  Psychosocial & Spiritual Assessment  Palliative Care Outcomes      Patient Active Problem List   Diagnosis Date Noted  . Sepsis ([place])   . Encounter for care related to feeding tube   . Acute on chronic respiratory failure ([place])   . Acute kidney injury ([place]) 06/12/2016  . HCAP (healthcare-associated pneumonia)   . Goals of care, counseling/discussion   . Encounter for intubation   . Hyponatremia 06/08/2016  . Delirium 06/04/2016  . Anxiety, generalized 03/13/2016  . Intermittent explosive disorder 03/13/2016  . Urinary retention 03/10/2016  . Aggressive behavior 03/06/2016  . Essential hypertension   . Post-ictal confusion 10/27/2015  . Hypothyroidism 10/27/2015  . Thrombocytopenia ([place]) 10/27/2015  . Acute respiratory failure with hypoxemia ([place]) 10/27/2015  . Seizure ([place]) 10/27/2015  . Psychosis 10/27/2015  . Normocytic anemia 10/27/2015  . DNR (do not resuscitate) 07/25/2015  . Aspiration pneumonia ([place]) 07/25/2015  . Palliative care encounter 07/25/2015  . Dysphagia   . Altered mental status   . UTI (lower urinary tract infection) 07/18/2015  . Cognitive decline 07/18/2015  . Acute encephalopathy 07/18/2015  . Mental retardation 07/18/2015  . Hypertension  07/18/2015  . History of CVA (cerebrovascular accident) 07/18/2015  . Encephalopathy acute 07/18/2015  . Dehydration     Palliative Care Assessment & Plan   Patient Profile:  56 y.o. male  with past medical history of MR with intermittent explosive disorder, paranoid schizophrenia, seizures, dysphagia with chronic aspiration, and CVA who was admitted on 06/15/2016 after his group home noted he had new tremors, a fall, and altered mental status. Initial work-up was largely non-diagnostic with CXR and urine without obvious infectious source, MRI and CT brain without obvious encephalopathic etiology. He did become increasingly aggressive, which shifted into somnolence and becoming unresponsive after he developed what appears to be aspiration pneumonia. Despite abx, he continues to deteriorate and required intubation on 12/7.   Recommendations/Plan: Goals of care:  Dr. Domingo Cocking had a long discussion with pt's legal guardian, Lanetta Inch, on 12/11. During this discussion the plan established was to continue current level of care with no escalation (no pressors, invasive procedures, central line, DNR). Primary team CCM attempted weaning and patient was extubated one way on 12-14.    12-16 Patient remains predominantly restless and agitated, he has high O2 requirements, unsafe for PO trials.   As per palliative medicine team's discussions with legal guardian, there was to be a time trial, and if patient has ongoing decline, plan was to shift focus to full scope of comfort measures.   This morning, it appears that patient will most benefit from comfort care, discussed with bedside RN and Dr Clementeen Graham, agree with current opioid and benzo regimen. Continue Haldol, call placed to discuss with Stacie, legal guardian, but unable to reach her.    12-17: I reached the patient's guardian late yesterday evening on 12-16, she is aware of the patient's ongoing decline and impending demise Continue to adjust drips to  ensure comfort   Goals of Care and Additional Recommendations: Continue comfort care   Code Status:    Code Status Orders        Start     Ordered   06/11/16 1702  Do not attempt resuscitation (DNR)  Continuous    Question Answer Comment  In the event of cardiac or respiratory ARREST Do not call a "code blue"   In the event of cardiac or respiratory ARREST Do not perform Intubation, CPR, defibrillation or ACLS   In the event of cardiac or respiratory ARREST Use medication by any route, position, wound care, and other measures to relive pain and suffering. May use oxygen, suction and manual treatment of airway obstruction as needed for comfort.      06/11/16 1701    Code Status History    Date Active Date Inactive Code Status Order ID Comments User Context   06/09/2016 11:27 AM 06/11/2016  5:01 PM Full Code [id number]  Albertine Patricia, MD Inpatient   06/24/2016  8:36 PM 06/09/2016 11:27 AM DNR [id number]  Norval Morton, MD ED   03/12/2016 10:34 PM 03/17/2016  6:26 PM Full Code [id number]  Orlie Dakin, MD ED   03/10/2016  5:23 PM 03/12/2016  9:49 PM Full Code [id number]  Caren Griffins, MD ED   03/10/2016  5:07 PM 03/10/2016  5:23 PM DNR [id number]  Mercy Riding, MD ED   03/04/2016  6:37 PM 03/06/2016  5:16 PM Full Code [id number]  Monico Blitz, PA-C ED   03/04/2016  4:18 PM 03/04/2016  6:37 PM Full Code [id number]  Monico Blitz, PA-C ED   10/27/2015 10:03 PM 10/28/2015  9:06 PM DNR [id number]  Vianne Bulls, MD ED   07/25/2015 11:18 AM 08/07/2015  8:00 PM DNR [id number]  Melton Alar, PA-C Inpatient   07/18/2015  8:49 PM 07/25/2015 11:18 AM Full Code [id number]  Willia Craze, NP Inpatient       Prognosis:   hours to days  Discharge Planning:  anticipated hospital death  Care plan was discussed with   care nurse, and primary attending.  Thank you for allowing the Palliative Medicine Team to assist in the care of this patient.   Time In: 15.20 Time Out: 15.40 Total Time 20  Prolonged Time Billed no      Greater than 50%  of this time was spent counseling and coordinating care related to the above assessment and plan.  Loistine Chance, MD [phone]  Please contact Palliative Medicine Team phone at [phone] for questions and concerns.

## 2016-07-05 DEATH — deceased

## 2018-01-09 IMAGING — MR MR HEAD W/O CM
9 of 10 series · 35 of 48 positions shown · non-contrast
Comparison: Prior CT from earlier the same day.

CLINICAL DATA: Initial evaluation for acute seizure. History of
seizures, developmental delay, psychosis.

EXAM:
MRI HEAD WITHOUT CONTRAST
TECHNIQUE: Multiplanar, multiecho pulse sequences of the brain and surrounding
structures were obtained without intravenous contrast.

[Series 4: T1 · sagittal · 5.0mm · 0.47mm/px · 1 of 23 slices shown]
[im 1/23]
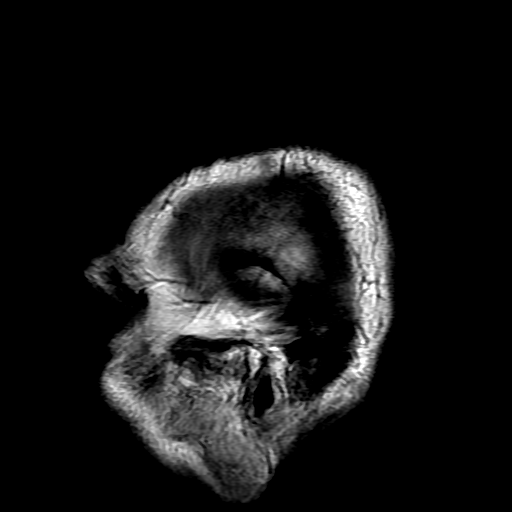

[Series 5: DWI · axial · 3.0mm · 1.09mm/px · z∈[-102,+46]mm · 8 of 102 slices shown (1 of 4)]
[im 1/102]
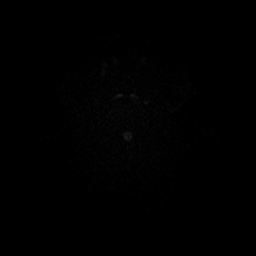
[im 12/102]
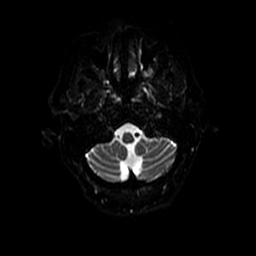
[im 34/102]
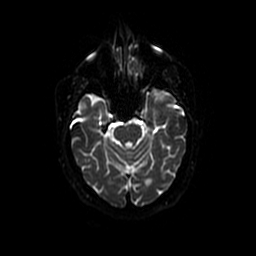
[im 45/102]
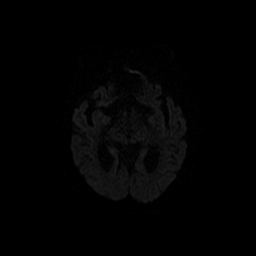
[im 57/102]
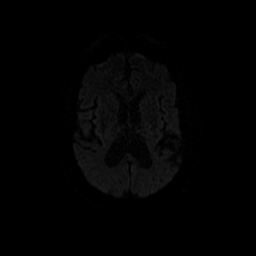
[im 68/102]
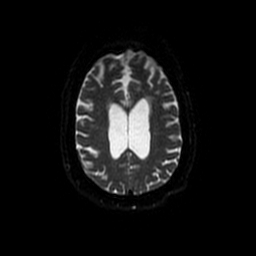
[im 90/102]
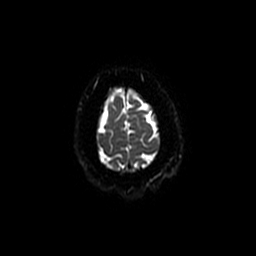
[im 102/102]
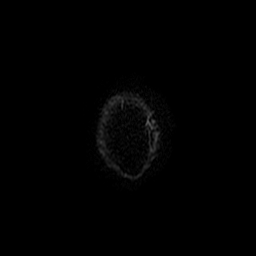

[Series 6: T2 · axial · 5.0mm · 0.43mm/px · z∈[-98,+50]mm · 3 of 26 slices shown (1 of 2)]
[im 1/26]
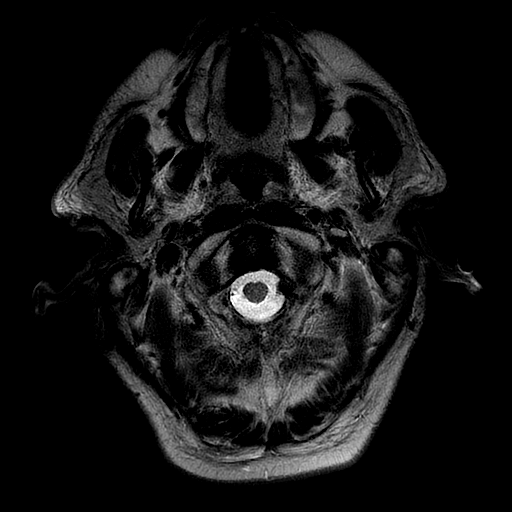
[im 13/26]
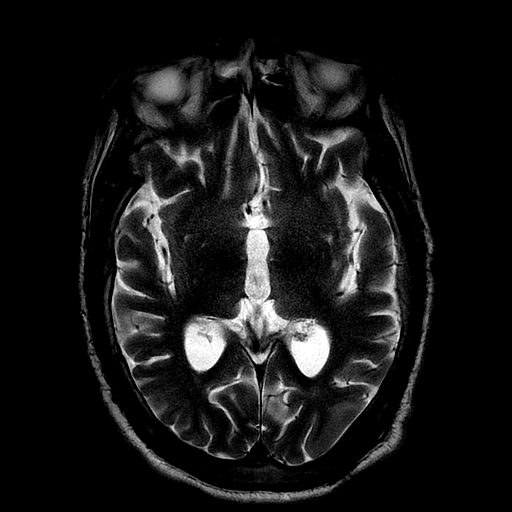
[im 26/26]
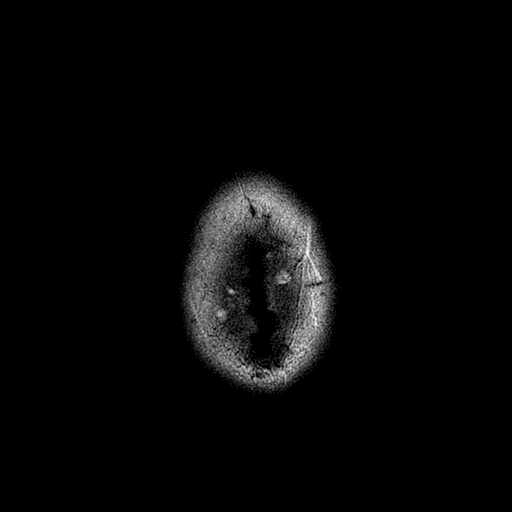

[Series 7: FLAIR · axial · 5.0mm · 0.43mm/px · z∈[-98,+50]mm · 3 of 26 slices shown]
[im 1/26]
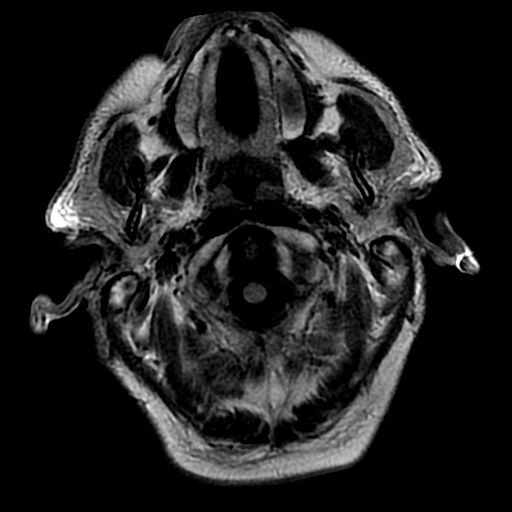
[im 13/26]
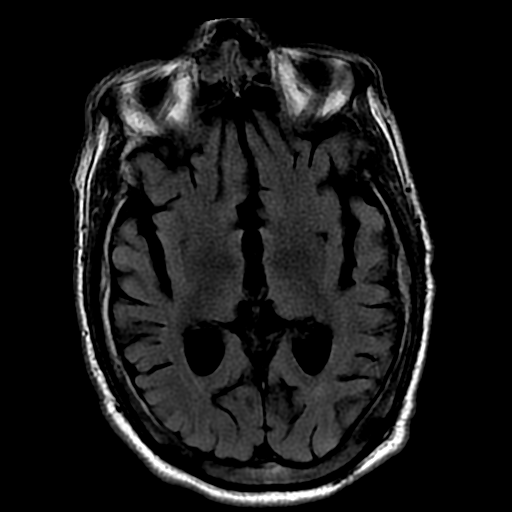
[im 26/26]
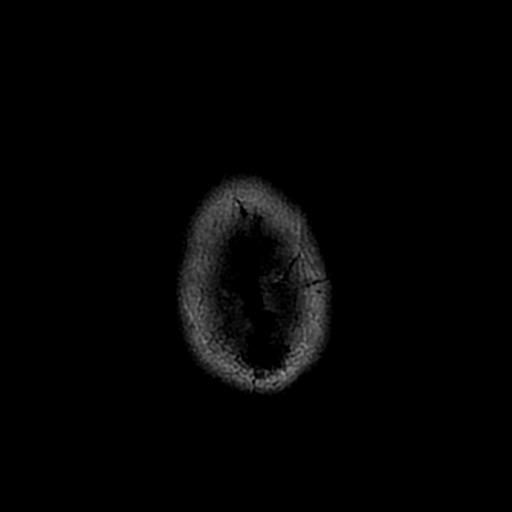

[Series 8: DWI · coronal · 5.0mm · 1.09mm/px · 7 of 70 slices shown (2 of 4)]
[im 1/70]
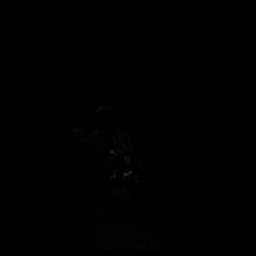
[im 12/70]
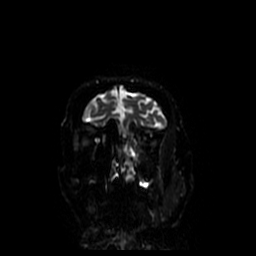
[im 24/70]
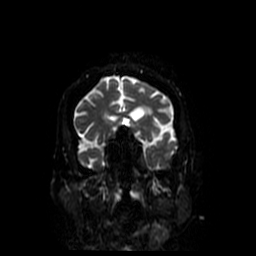
[im 35/70]
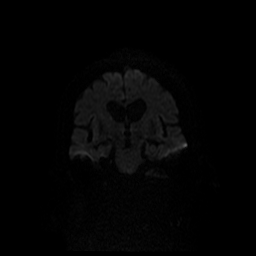
[im 47/70]
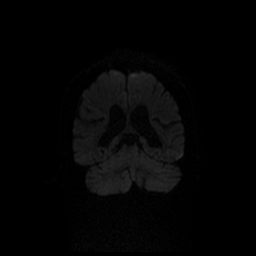
[im 58/70]
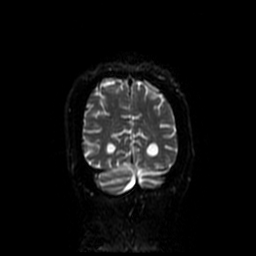
[im 70/70]
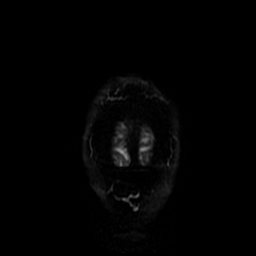

[Series 9: ax mpgr · axial · 5.0mm · 0.43mm/px · z∈[-98,-27]mm · 2 of 26 slices shown]
[im 1/26]
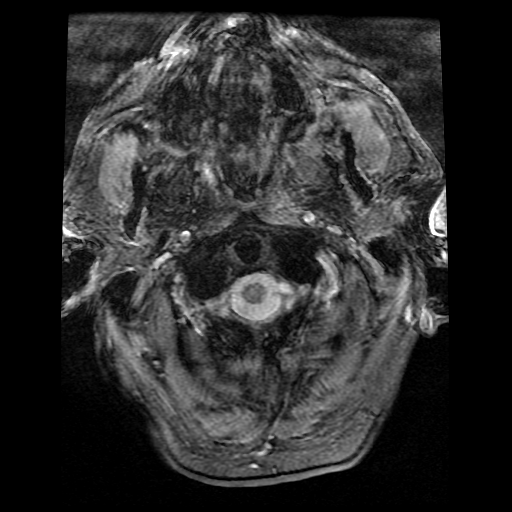
[im 13/26]
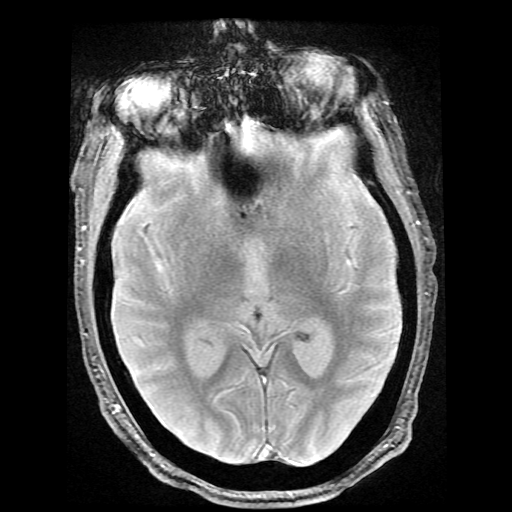

[Series 11: T2 · coronal · 5.0mm · 0.43mm/px · 3 of 30 slices shown (2 of 2)]
[im 1/30]
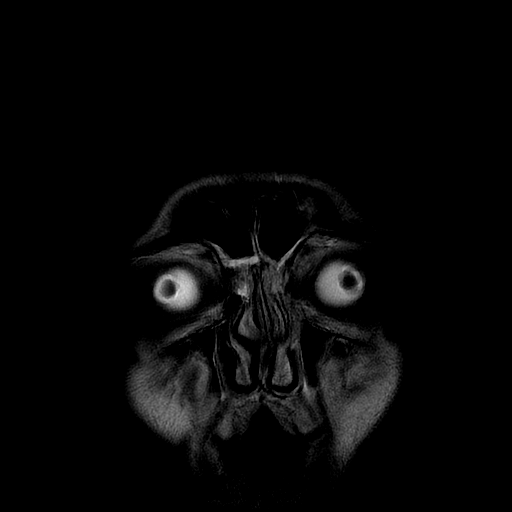
[im 15/30]
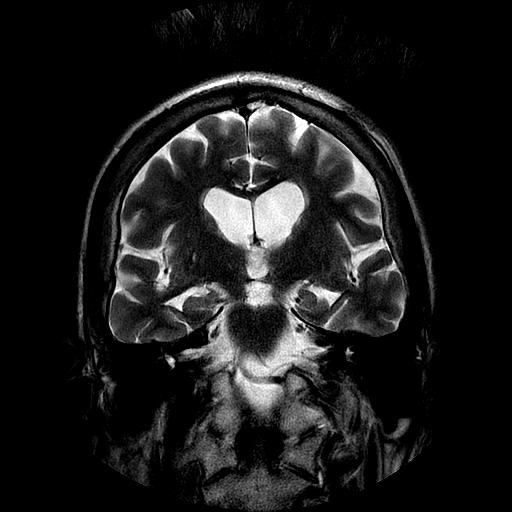
[im 30/30]
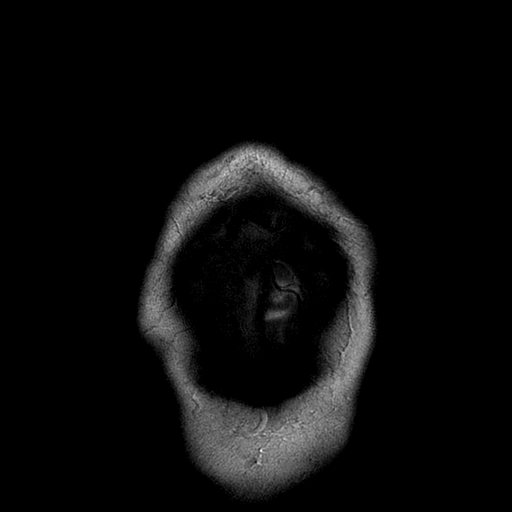

[Series 500: DWI · axial · 3.0mm · 1.09mm/px · z∈[-102,+46]mm · 5 of 51 slices shown (3 of 4)]
[im 1/51]
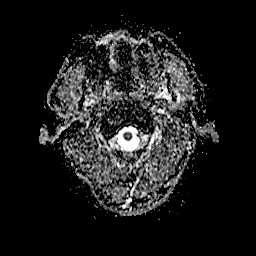
[im 13/51]
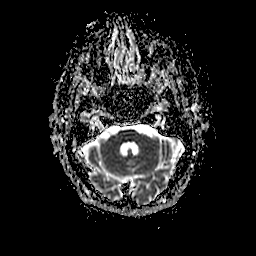
[im 26/51]
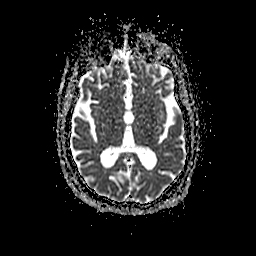
[im 38/51]
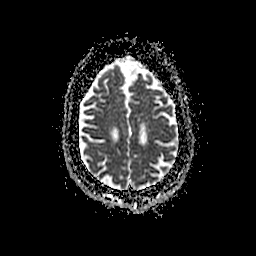
[im 51/51]
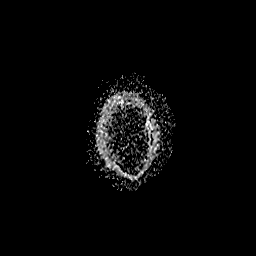

[Series 800: DWI · coronal · 5.0mm · 1.09mm/px · 3 of 35 slices shown (4 of 4)]
[im 1/35]
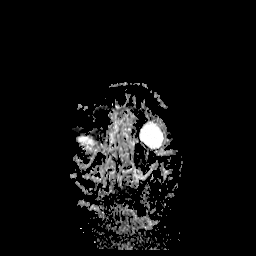
[im 18/35]
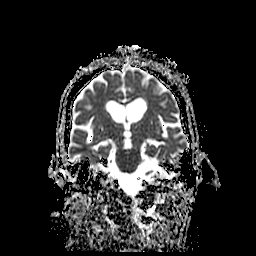
[im 35/35]
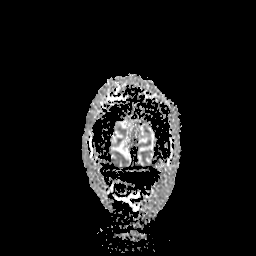

[35 of 48 positions shown; findings below may reference images not displayed]

FINDINGS: Study mildly degraded by motion artifact.

Diffuse prominence of the CSF containing spaces compatible with
age-related cerebral atrophy. Patchy and confluent T2/FLAIR
hyperintensity within the periventricular and deep white matter most
consistent with chronic small vessel ischemic disease, mild in
nature. Few small chronic remote lacunar infarcts within the
bilateral basal ganglia.

No abnormal foci of restricted diffusion to suggest infarct or acute
seizure activity. Major intracranial vascular flow voids maintained.
Gray-white matter differentiation preserved. No acute intracranial
hemorrhage.

No mass lesion, midline shift, or mass effect. No hydrocephalus. No
extra-axial fluid collection. Major dural sinuses are patent.

Craniocervical junction within normal limits. Visualized upper
cervical spine unremarkable.

Pituitary gland within normal limits. No acute abnormality about the
orbits.

Scattered mucosal thickening throughout the paranasal sinuses. No
air-fluid levels to suggest active sinus infection. No mastoid
effusion. Inner ear structures normal.

Bone marrow signal intensity within normal limits. Scalp soft
tissues demonstrate no acute abnormality.
IMPRESSION: 1. No acute intracranial process.
2. Mild age-related cerebral atrophy with chronic small vessel
ischemic disease. Small remote lacunar infarcts within the bilateral
basal ganglia.
